# Patient Record
Sex: Female | Born: 1955 | Race: White | Hispanic: No
Health system: Midwestern US, Community
[De-identification: ages and names within clinical notes are randomized; demographics above are authoritative.]

## PROBLEM LIST (undated history)

## (undated) ENCOUNTER — Emergency Department (HOSPITAL_COMMUNITY)
Admission: EM | Disposition: A | Discharge status: Home / Self Care | Payer: Medicare Other | Attending: Chiropractic Medicine | Admitting: Chiropractic Medicine

## (undated) DIAGNOSIS — R222 Localized swelling, mass and lump, trunk: Secondary | ICD-10-CM

## (undated) DIAGNOSIS — Z87891 Personal history of nicotine dependence: Secondary | ICD-10-CM

## (undated) DIAGNOSIS — Z1231 Encounter for screening mammogram for malignant neoplasm of breast: Secondary | ICD-10-CM

## (undated) DIAGNOSIS — M79651 Pain in right thigh: Secondary | ICD-10-CM

## (undated) DIAGNOSIS — M79604 Pain in right leg: Secondary | ICD-10-CM

## (undated) DIAGNOSIS — M5410 Radiculopathy, site unspecified: Secondary | ICD-10-CM

## (undated) DIAGNOSIS — C3481 Malignant neoplasm of overlapping sites of right bronchus and lung: Secondary | ICD-10-CM

## (undated) DIAGNOSIS — J449 Chronic obstructive pulmonary disease, unspecified: Secondary | ICD-10-CM

## (undated) DIAGNOSIS — R221 Localized swelling, mass and lump, neck: Secondary | ICD-10-CM

## (undated) DIAGNOSIS — E2839 Other primary ovarian failure: Secondary | ICD-10-CM

## (undated) DIAGNOSIS — J219 Acute bronchiolitis, unspecified: Secondary | ICD-10-CM

## (undated) DIAGNOSIS — Z860100 Personal history of colon polyps, unspecified: Secondary | ICD-10-CM

## (undated) DIAGNOSIS — F329 Major depressive disorder, single episode, unspecified: Secondary | ICD-10-CM

## (undated) DIAGNOSIS — R4689 Other symptoms and signs involving appearance and behavior: Secondary | ICD-10-CM

## (undated) DIAGNOSIS — E785 Hyperlipidemia, unspecified: Secondary | ICD-10-CM

## (undated) DIAGNOSIS — I10 Essential (primary) hypertension: Secondary | ICD-10-CM

## (undated) DIAGNOSIS — K219 Gastro-esophageal reflux disease without esophagitis: Secondary | ICD-10-CM

## (undated) DIAGNOSIS — F32A Depression, unspecified: Secondary | ICD-10-CM

## (undated) DIAGNOSIS — R55 Syncope and collapse: Secondary | ICD-10-CM

## (undated) DIAGNOSIS — K589 Irritable bowel syndrome without diarrhea: Secondary | ICD-10-CM

## (undated) DIAGNOSIS — M797 Fibromyalgia: Secondary | ICD-10-CM

## (undated) DIAGNOSIS — M1990 Unspecified osteoarthritis, unspecified site: Secondary | ICD-10-CM

## (undated) DIAGNOSIS — I739 Peripheral vascular disease, unspecified: Secondary | ICD-10-CM

## (undated) DIAGNOSIS — R4589 Other symptoms and signs involving emotional state: Secondary | ICD-10-CM

## (undated) DIAGNOSIS — I779 Disorder of arteries and arterioles, unspecified: Secondary | ICD-10-CM

## (undated) DIAGNOSIS — F419 Anxiety disorder, unspecified: Secondary | ICD-10-CM

## (undated) DIAGNOSIS — M545 Low back pain, unspecified: Secondary | ICD-10-CM

## (undated) DIAGNOSIS — N63 Unspecified lump in unspecified breast: Secondary | ICD-10-CM

## (undated) DIAGNOSIS — D4410 Neoplasm of uncertain behavior of unspecified adrenal gland: Secondary | ICD-10-CM

## (undated) DIAGNOSIS — R109 Unspecified abdominal pain: Secondary | ICD-10-CM

## (undated) DIAGNOSIS — I7090 Unspecified atherosclerosis: Secondary | ICD-10-CM

## (undated) DIAGNOSIS — R413 Other amnesia: Secondary | ICD-10-CM

## (undated) DIAGNOSIS — D3500 Benign neoplasm of unspecified adrenal gland: Secondary | ICD-10-CM

## (undated) DIAGNOSIS — R928 Other abnormal and inconclusive findings on diagnostic imaging of breast: Secondary | ICD-10-CM

## (undated) DIAGNOSIS — D241 Benign neoplasm of right breast: Secondary | ICD-10-CM

## (undated) DIAGNOSIS — D4411 Neoplasm of uncertain behavior of right adrenal gland: Secondary | ICD-10-CM

## (undated) DIAGNOSIS — M79671 Pain in right foot: Secondary | ICD-10-CM

## (undated) DIAGNOSIS — R0609 Other forms of dyspnea: Secondary | ICD-10-CM

## (undated) DIAGNOSIS — K529 Noninfective gastroenteritis and colitis, unspecified: Secondary | ICD-10-CM

## (undated) HISTORY — DX: Irritable bowel syndrome, unspecified: K58.9

## (undated) HISTORY — DX: Personal history of colon polyps, unspecified: Z86.0100

## (undated) HISTORY — DX: Depression, unspecified: F32.A

## (undated) HISTORY — DX: Anxiety disorder, unspecified: F41.9

## (undated) HISTORY — DX: Unspecified osteoarthritis, unspecified site: M19.90

## (undated) HISTORY — PX: TUBAL LIGATION: SHX77

## (undated) HISTORY — PX: CHOLECYSTECTOMY: SHX55

## (undated) HISTORY — DX: Chronic obstructive pulmonary disease, unspecified: J44.9

## (undated) HISTORY — DX: Personal history of colonic polyps: Z86.010

## (undated) HISTORY — DX: Other symptoms and signs involving appearance and behavior: R46.89

## (undated) HISTORY — PX: BREAST SURGERY: SHX581

## (undated) HISTORY — DX: Disorder of arteries and arterioles, unspecified: I77.9

## (undated) HISTORY — DX: Fibromyalgia: M79.7

## (undated) HISTORY — DX: Syncope and collapse: R55

## (undated) HISTORY — DX: Hyperlipidemia, unspecified: E78.5

## (undated) HISTORY — DX: Essential (primary) hypertension: I10

## (undated) HISTORY — DX: Peripheral vascular disease, unspecified: I73.9

## (undated) HISTORY — DX: Major depressive disorder, single episode, unspecified: F32.9

## (undated) HISTORY — DX: Gastro-esophageal reflux disease without esophagitis: K21.9

## (undated) HISTORY — DX: Other symptoms and signs involving emotional state: R45.89

---

## 1976-03-18 HISTORY — PX: TUBAL LIGATION: SHX77

## 2005-03-18 HISTORY — PX: CARPAL TUNNEL RELEASE: SHX101

## 2005-03-19 ENCOUNTER — Encounter: Payer: Self-pay | Admitting: Internal Medicine

## 2005-03-21 ENCOUNTER — Encounter: Payer: Self-pay | Admitting: Internal Medicine

## 2005-03-29 ENCOUNTER — Encounter: Payer: Self-pay | Admitting: Internal Medicine

## 2005-04-22 NOTE — Procedures (Signed)
[facility]                           ELECTROENCEPHALOGRAM REPORT   NAME:     Ulbrich, Zarya M                              DATE:     04/22/2005   EEG#:                                                 MR#:      [id number]   ROOM:     OP                                         DOB:      09/02/1955   REFERRING PHYSICIAN:   cc:   HEMANG SHAH, M.D.         DR. BAROT   AGE:  49   Dear Dr. I. Barot:   Thank you for referring Ms. Coreas for EEG.   INDICATION:  Possible partial seizures.   MEDICATIONS:  Flax seed oil, fish oil,   INTRODUCTION:  EEG is performed on an 18-channel machine with 10/20   international system on this 49-year-old female to rule out seizure   disorder.  She was not given medication for EEG.   DESCRIPTION:  The background rhythm is 8-9 Hz, 30-40 microvolt activity,   which is bilaterally symmetrical and reactive to eye opening and closing.   Hyperventilation was performed which produced bilateral symmetrical   slowing.  Photic stimulation produced bilateral symmetrical occipital   driving.  Stage I sleep was recorded with 4-6 Hz slowing.   IMPRESSION:  This a normal awake and drowsy EEG.  No epileptiform   discharges seen.   CLINICAL CORRELATION:  Suggestive of normal cerebral function.   ____________________________________    ___________________________   HEMANG SHAH, M.D.                        Date   md  D: 04/22/2005  T: 04/23/2005 12:56 P   [id number]

## 2005-04-22 NOTE — Procedures (Signed)
[facility]                           ELECTROENCEPHALOGRAM REPORT   NAME:     Hannah [name], Hannah [name]                              DATE:     04/22/2005   EEG#:                                                 MR#:      [id number]   ROOM:     OP                                         DOB:      [birth year]   REFERRING PHYSICIAN:   cc:   Cristopher Peru, M.D.         DR. Tawanna Solo   AGE:  49   Dear Dr. Judge Stall:   Thank you for referring Ms. Mccullar for EEG.   INDICATION:  Possible partial seizures.   MEDICATIONS:  Flax seed oil, fish oil,   INTRODUCTION:  EEG is performed on an 18-channel machine with 10/20   international system on this 50 year old female to rule out seizure   disorder.  She was not given medication for EEG.   DESCRIPTION:  The background rhythm is 8-9 Hz, 30-40 microvolt activity,   which is bilaterally symmetrical and reactive to eye opening and closing.   Hyperventilation was performed which produced bilateral symmetrical   slowing.  Photic stimulation produced bilateral symmetrical occipital   driving.  Stage I sleep was recorded with 4-6 Hz slowing.   IMPRESSION:  This a normal awake and drowsy EEG.  No epileptiform   discharges seen.   CLINICAL CORRELATION:  Suggestive of normal cerebral function.   ____________________________________    ___________________________   Cristopher Peru, M.D.                        Date   md  D: 04/22/2005  T: 04/23/2005 12:56 P   [id number]

## 2005-04-29 ENCOUNTER — Encounter: Payer: Self-pay | Admitting: Internal Medicine

## 2005-08-14 NOTE — Op Note (Signed)
[facility]                                OPERATION REPORT                          SURGEON:  Jolyn Nap, MD   NAM JAPJI, KOK:   MR  [id number]                         DATE:            08/14/2005   #:   Lindley Magnus  [id number]                      PT. LOCATION:   #   Jolyn Nap, MD                   DOB: 30-Nov-1955   cc:    Jolyn Nap, MD   PREOPERATIVE DIAGNOSIS:   Right carpal tunnel syndrome.   POSTOPERATIVE DIAGNOSIS:   Right carpal tunnel syndrome.   OPERATION:   Endoscopic right carpal tunnel release using AGEE endoscopic carpal tunnel   release system.   SURGEON:   Dr. Milana Obey   SURGICAL ASSISTANT:   Argentina Donovan, CST   ANESTHESIA:   Monitored anesthesia care.   FLUIDS DELIVERED INTRAOPERATIVELY:   450 cc Ringers lactate   ESTIMATED BLOOD LOSS:   Minimal.   TOTAL TOURNIQUET TIME FOR THE CASE:   8 minutes at 250 mmHg.   COMPLICATIONS:   None.   INDICATIONS:   The patient is a 50 year old, right-hand dominant, white female who has   symptomatic right carpal tunnel syndrome that has failed conservative   treatment.  She is being taken to the operating room to undergo right   endoscopic carpal tunnel release.   PROCEDURE:  The patient was taken to the operating room and placed on the   OR table in supine position.  IV sedation was delivered followed by   placement of local infiltration anesthetic in the area of intended incision   in the right palm, wrist, and distal volar forearm.  A total volume of 10   cc of a 50/50 solution of 0.5% Marcaine and 2% Xylocaine plain was   injected.  The tourniquet was applied to the right upper extremity.  The   right upper extremity was then prepped and draped in the usual fashion for   surgery.  A 3.5 power loop magnification was used throughout the case.  The   right upper extremity was exsanguinated with an Esmarch bandage.  The   tourniquet was inflated to 250 mmHg.  The total tourniquet time for the    case was 8 minutes.  A transverse incision was made in the wrist flexion   crease from the flexor carpi radialis to the flexor carpi ulnaris.  After   the skin was incised, soft tissues were dissected bluntly with tenotomy   scissors.  The palmaris longus tendon was identified and retracted   radially.  The volar forearm fascia was identified and a distally based   U-shaped flap was formed by use of a fresh #15 scalpel blade.  This fascial   flap was then reflected distally.  This exposed the medial nerve at the   entry of the carpal canal.  The AGEE endoscopic carpal tunnel bursal   spatula/resector was then placed into the carpal canal and the soft tissues   were dissected off the undersurface of the transverse carpal ligament.  The   2 dilators were then placed starting with the smaller and ending with the   larger instrument.  The AGEE endoscopic carpal tunnel release blade   assembly was then placed into the carpal canal.  The distal edge of the   transverse carpal ligament was visualized.  There were no other visible   structures other than the transverse fibers of the transverse carpal   ligament.  The blade was deployed and the transverse carpal ligament was   released in the prescribed fashion maintaining alignment with the ring   finger and holding the blade against the hook of the hamate.  One   additional pass was necessary in order to resect a few very distal fibers   that were left intact with the first pass. Attention was then directed   proximally.  Soft tissues were dissected superficial and deep to the volar   forearm fascia proximal to the incision.  A long handle tenotomy scissor   was then used to incise the volar forearm fascia proximal to the incision   for a distance of about 4 to 5 cm in order to fully release all tight   structures in the volar forearm.  The endoscopic carpal tunnel blade   assembly was re-placed into the carpal canal and the tourniquet released.    There was no visible arterial bleeding.  There was some minimal venous   oozing.  At this point, all instrumentation was removed.  The wounds were   thoroughly irrigated with antibiotic irrigation.  Hemostasis was obtained   by direct pressure and elevation followed by bipolar electrocautery Bovie.   Subcutaneous closure was performed with 4-0 Monocryl sutures.  The skin was   closed with a subcuticular technique of 4-0 Monocryl sutures.  Benzoin and   Steri-Strips were applied followed by Xeroform and sterile gauze dressing.   A volar forearm plaster splint was then applied and held in place with an   Ace wrap.  The patient was then returned to the recovery area in good   condition.  There were no pre-, intra-, or postoperative complications.   Electronically Signed By:   Jolyn Nap, MD 08/18/2005 17:16   _________________________________   Jolyn Nap, MD   zga  D:  08/14/2005  T:  08/14/2005  9:41 A   [id number]

## 2005-08-14 NOTE — Op Note (Signed)
[facility]                                OPERATION REPORT                          SURGEON:  Hannah Nap, MD   NAM VITA, CURRIN:   MR  [id number]                         DATE:            08/14/2005   #:   Hannah [name]  [id number]                      PT. LOCATION:   #   Hannah Nap, MD                   DOB: Mar 31, 1955   cc:    Hannah Nap, MD   PREOPERATIVE DIAGNOSIS:   Right carpal tunnel syndrome.   POSTOPERATIVE DIAGNOSIS:   Right carpal tunnel syndrome.   OPERATION:   Endoscopic right carpal tunnel release using AGEE endoscopic carpal tunnel   release system.   SURGEON:   Dr. Milana Obey   SURGICAL ASSISTANT:   Argentina Donovan, CST   ANESTHESIA:   Monitored anesthesia care.   FLUIDS DELIVERED INTRAOPERATIVELY:   450 cc Ringers lactate   ESTIMATED BLOOD LOSS:   Minimal.   TOTAL TOURNIQUET TIME FOR THE CASE:   8 minutes at 250 mmHg.   COMPLICATIONS:   None.   INDICATIONS:   The patient is a 50 year old, right-hand dominant, white female who has   symptomatic right carpal tunnel syndrome that has failed conservative   treatment.  She is being taken to the operating room to undergo right   endoscopic carpal tunnel release.   PROCEDURE:  The patient was taken to the operating room and placed on the   OR table in supine position.  IV sedation was delivered followed by   placement of local infiltration anesthetic in the area of intended incision   in the right palm, wrist, and distal volar forearm.  A total volume of 10   cc of a 50/50 solution of 0.5% Marcaine and 2% Xylocaine plain was   injected.  The tourniquet was applied to the right upper extremity.  The   right upper extremity was then prepped and draped in the usual fashion for   surgery.  A 3.5 power loop magnification was used throughout the case.  The   right upper extremity was exsanguinated with an Esmarch bandage.  The   tourniquet was inflated to 250 mmHg.  The total tourniquet time for the   case was 8 minutes.  A transverse  incision was made in the wrist flexion   crease from the flexor carpi radialis to the flexor carpi ulnaris.  After   the skin was incised, soft tissues were dissected bluntly with tenotomy   scissors.  The palmaris longus tendon was identified and retracted   radially.  The volar forearm fascia was identified and a distally based   U-shaped flap was formed by use of a fresh #15 scalpel blade.  This fascial   flap was then reflected distally.  This exposed the medial nerve at the   entry of the carpal canal.  The AGEE endoscopic carpal tunnel bursal   spatula/resector was then placed into the carpal canal and the soft tissues   were dissected off the undersurface of the transverse carpal ligament.  The   2 dilators were then placed starting with the smaller and ending with the   larger instrument.  The AGEE endoscopic carpal tunnel release blade   assembly was then placed into the carpal canal.  The distal edge of the   transverse carpal ligament was visualized.  There were no other visible   structures other than the transverse fibers of the transverse carpal   ligament.  The blade was deployed and the transverse carpal ligament was   released in the prescribed fashion maintaining alignment with the ring   finger and holding the blade against the hook of the hamate.  One   additional pass was necessary in order to resect a few very distal fibers   that were left intact with the first pass. Attention was then directed   proximally.  Soft tissues were dissected superficial and deep to the volar   forearm fascia proximal to the incision.  A long handle tenotomy scissor   was then used to incise the volar forearm fascia proximal to the incision   for a distance of about 4 to 5 cm in order to fully release all tight   structures in the volar forearm.  The endoscopic carpal tunnel blade   assembly was re-placed into the carpal canal and the tourniquet released.   There was no visible arterial bleeding.  There was some  minimal venous   oozing.  At this point, all instrumentation was removed.  The wounds were   thoroughly irrigated with antibiotic irrigation.  Hemostasis was obtained   by direct pressure and elevation followed by bipolar electrocautery Bovie.   Subcutaneous closure was performed with 4-0 Monocryl sutures.  The skin was   closed with a subcuticular technique of 4-0 Monocryl sutures.  Benzoin and   Steri-Strips were applied followed by Xeroform and sterile gauze dressing.   A volar forearm plaster splint was then applied and held in place with an   Ace wrap.  The patient was then returned to the recovery area in good   condition.  There were no pre-, intra-, or postoperative complications.   Electronically Signed By:   Hannah Nap, MD 08/18/2005 17:16   _________________________________   Hannah Nap, MD   zga  D:  08/14/2005  T:  08/14/2005  9:41 A   [id number]

## 2006-02-03 NOTE — Procedures (Signed)
[facility]                              CARDIOLOGY DEPARTMENT                     AMBULATORY ECG (HOLTER MONITOR) REPORT   Name: [name], Hannah M                      Location:            Age: 50   Date:   Ref Phys:                                  MR#: [id number]   Billing#      [id number]   Reason For Study:   Procedure:    A dual channel ambulatory ECG was obtained and computer scan                 analysis was performed.  Selected strips were reviewed as                 recorded in real time.   Results and Impression:   INDICATION:  785.1   FINDINGS:  Technically adequate triple-channel ambulatory ECG.  Today's   tracing demonstrates underlying sinus mechanism with normal heart rate   ranging from 54 up to 130 beats per minute - average rate is 86 beats per   minute.  There were 3 VPCs - no couplets or triplets.  There were 34 APCs   with a single couplet and a single 5-beat run of PSVT at 160 beats per   minute.   No pauses or episodes of heart block were seen.   The patient noted 2 episodes of "palpitations."  Both were seen as sinus   rhythm and no ectopy.   OVERALL IMPRESSION:  Sinus rhythm with rare, asymptomatic APCs and VPCs   including a single 5-beat run of PSVT.  The patient's 2 episodes of   "palpitations" were not associated with any dysrhythmia.   __________________________________________________   CHARLES ASHBY, JR, M.D.   sc  D: 02/03/2006  T: 02/03/2006  4:00 P    [id number]

## 2006-02-03 NOTE — Procedures (Signed)
 [facility]                              CARDIOLOGY DEPARTMENT                     AMBULATORY ECG (HOLTER MONITOR) REPORT   Name: Hannah [name], Hannah [name]                      Location:            Age: 50   Date:   Ref Phys:                                  MR#: [id number]   Billing#      [id number]   Reason For Study:   Procedure:    A dual channel ambulatory ECG was obtained and computer scan                 analysis was performed.  Selected strips were reviewed as                 recorded in real time.   Results and Impression:   INDICATION:  785.1   FINDINGS:  Technically adequate triple-channel ambulatory ECG.  Today's   tracing demonstrates underlying sinus mechanism with normal heart rate   ranging from 54 up to 130 beats per minute - average rate is 86 beats per   minute.  There were 3 VPCs - no couplets or triplets.  There were 34 APCs   with a single couplet and a single 5-beat run of PSVT at 160 beats per   minute.   No pauses or episodes of heart block were seen.   The patient noted 2 episodes of palpitations.  Both were seen as sinus   rhythm and no ectopy.   OVERALL IMPRESSION:  Sinus rhythm with rare, asymptomatic APCs and VPCs   including a single 5-beat run of PSVT.  The patient's 2 episodes of   palpitations were not associated with any dysrhythmia.   __________________________________________________   CARLIN ALMYRA RADDLE, M.D.   sc  D: 02/03/2006  T: 02/03/2006  4:00 P    [id number]

## 2006-03-18 HISTORY — PX: COLONOSCOPY: SHX174

## 2006-11-18 DIAGNOSIS — M797 Fibromyalgia: Secondary | ICD-10-CM | POA: Insufficient documentation

## 2006-11-18 DIAGNOSIS — G2581 Restless legs syndrome: Secondary | ICD-10-CM | POA: Insufficient documentation

## 2007-02-10 ENCOUNTER — Encounter: Payer: Self-pay | Admitting: Family Medicine

## 2007-10-15 ENCOUNTER — Ambulatory Visit: Payer: Self-pay | Admitting: Internal Medicine

## 2007-10-30 ENCOUNTER — Other Ambulatory Visit: Payer: Self-pay

## 2007-10-30 ENCOUNTER — Ambulatory Visit: Payer: Self-pay | Admitting: Surgery

## 2007-11-04 ENCOUNTER — Ambulatory Visit: Payer: Self-pay | Admitting: Surgery

## 2007-11-25 ENCOUNTER — Ambulatory Visit: Payer: Self-pay | Admitting: Family Medicine

## 2007-11-25 DIAGNOSIS — IMO0001 Reserved for inherently not codable concepts without codable children: Secondary | ICD-10-CM

## 2007-12-31 ENCOUNTER — Ambulatory Visit: Payer: Self-pay | Admitting: Internal Medicine

## 2008-07-18 ENCOUNTER — Ambulatory Visit: Payer: Self-pay | Admitting: Family Medicine

## 2008-07-18 ENCOUNTER — Encounter: Payer: Self-pay | Admitting: Family Medicine

## 2008-07-18 ENCOUNTER — Other Ambulatory Visit: Admission: RE | Admit: 2008-07-18 | Discharge: 2008-07-18 | Payer: Self-pay | Admitting: Family Medicine

## 2008-07-22 ENCOUNTER — Encounter (INDEPENDENT_AMBULATORY_CARE_PROVIDER_SITE_OTHER): Payer: Self-pay

## 2008-07-25 ENCOUNTER — Encounter: Admission: RE | Admit: 2008-07-25 | Discharge: 2008-07-25 | Payer: Self-pay | Admitting: Family Medicine

## 2008-08-01 ENCOUNTER — Encounter (INDEPENDENT_AMBULATORY_CARE_PROVIDER_SITE_OTHER): Payer: Self-pay | Admitting: *Deleted

## 2008-08-09 ENCOUNTER — Telehealth: Payer: Self-pay | Admitting: Family Medicine

## 2008-08-09 ENCOUNTER — Ambulatory Visit: Payer: Self-pay | Admitting: Family Medicine

## 2008-08-09 ENCOUNTER — Emergency Department (HOSPITAL_COMMUNITY): Admission: EM | Admit: 2008-08-09 | Discharge: 2008-08-09 | Payer: Self-pay | Admitting: Emergency Medicine

## 2008-08-09 DIAGNOSIS — F3341 Major depressive disorder, recurrent, in partial remission: Secondary | ICD-10-CM | POA: Insufficient documentation

## 2008-08-18 ENCOUNTER — Ambulatory Visit: Payer: Self-pay | Admitting: Family Medicine

## 2008-08-18 LAB — CONVERTED CEMR LAB
AST: 24 units/L (ref 0–37)
Albumin: 3.8 g/dL (ref 3.5–5.2)
Alkaline Phosphatase: 89 units/L (ref 39–117)
BUN: 17 mg/dL (ref 6–23)
Basophils Absolute: 0.1 10*3/uL (ref 0.0–0.1)
Basophils Relative: 0.8 % (ref 0.0–3.0)
Chloride: 105 meq/L (ref 96–112)
Creatinine, Ser: 0.9 mg/dL (ref 0.4–1.2)
Direct LDL: 199.4 mg/dL
Eosinophils Absolute: 0.2 10*3/uL (ref 0.0–0.7)
GFR calc non Af Amer: 69.95 mL/min (ref >60)
Glucose, Bld: 98 mg/dL (ref 70–99)
HCT: 38.6 % (ref 36.0–46.0)
Lymphocytes Relative: 39.8 % (ref 12.0–46.0)
MCHC: 35.2 g/dL (ref 30.0–36.0)
Monocytes Absolute: 0.7 10*3/uL (ref 0.1–1.0)
Platelets: 236 10*3/uL (ref 150.0–400.0)
Potassium: 4.7 meq/L (ref 3.5–5.1)
RBC: 4.08 M/uL (ref 3.87–5.11)
RDW: 12.2 % (ref 11.5–14.6)
TSH: 1.58 microintl units/mL (ref 0.35–5.50)
Total Bilirubin: 0.6 mg/dL (ref 0.3–1.2)
Total CHOL/HDL Ratio: 6
Triglycerides: 216 mg/dL — ABNORMAL HIGH (ref 0.0–149.0)
VLDL: 43.2 mg/dL — ABNORMAL HIGH (ref 0.0–40.0)
WBC: 7.8 10*3/uL (ref 4.5–10.5)

## 2008-08-19 ENCOUNTER — Ambulatory Visit: Payer: Self-pay | Admitting: Family Medicine

## 2008-08-19 DIAGNOSIS — E785 Hyperlipidemia, unspecified: Secondary | ICD-10-CM | POA: Insufficient documentation

## 2008-09-26 ENCOUNTER — Ambulatory Visit: Payer: Self-pay | Admitting: Family Medicine

## 2008-09-26 DIAGNOSIS — G479 Sleep disorder, unspecified: Secondary | ICD-10-CM | POA: Insufficient documentation

## 2008-10-28 ENCOUNTER — Ambulatory Visit: Payer: Self-pay | Admitting: Family Medicine

## 2008-11-02 ENCOUNTER — Encounter: Admission: RE | Admit: 2008-11-02 | Discharge: 2008-11-02 | Payer: Self-pay | Admitting: Family Medicine

## 2008-11-07 ENCOUNTER — Telehealth: Payer: Self-pay | Admitting: Family Medicine

## 2008-11-10 ENCOUNTER — Encounter: Payer: Self-pay | Admitting: Family Medicine

## 2008-12-01 ENCOUNTER — Encounter: Payer: Self-pay | Admitting: Family Medicine

## 2008-12-19 ENCOUNTER — Encounter: Payer: Self-pay | Admitting: Family Medicine

## 2009-01-02 ENCOUNTER — Ambulatory Visit: Payer: Self-pay | Admitting: Rheumatology

## 2009-01-18 ENCOUNTER — Encounter: Payer: Self-pay | Admitting: Family Medicine

## 2009-01-24 ENCOUNTER — Ambulatory Visit: Payer: Self-pay | Admitting: Rheumatology

## 2009-01-27 ENCOUNTER — Telehealth: Payer: Self-pay | Admitting: Family Medicine

## 2009-01-27 ENCOUNTER — Ambulatory Visit: Payer: Self-pay | Admitting: Family Medicine

## 2009-02-08 ENCOUNTER — Ambulatory Visit: Payer: Self-pay | Admitting: Family Medicine

## 2009-02-14 ENCOUNTER — Encounter: Payer: Self-pay | Admitting: Family Medicine

## 2009-02-14 ENCOUNTER — Ambulatory Visit: Payer: Self-pay

## 2009-03-01 ENCOUNTER — Ambulatory Visit: Payer: Self-pay | Admitting: Family Medicine

## 2009-03-27 ENCOUNTER — Ambulatory Visit: Payer: Self-pay | Admitting: Family Medicine

## 2009-03-27 LAB — CONVERTED CEMR LAB
AST: 31 units/L (ref 0–37)
Direct LDL: 164.3 mg/dL
Total Bilirubin: 0.6 mg/dL (ref 0.3–1.2)

## 2009-03-30 ENCOUNTER — Ambulatory Visit: Payer: Self-pay | Admitting: Family Medicine

## 2009-04-04 ENCOUNTER — Telehealth: Payer: Self-pay | Admitting: Family Medicine

## 2009-04-05 ENCOUNTER — Telehealth: Payer: Self-pay | Admitting: Family Medicine

## 2009-04-06 ENCOUNTER — Ambulatory Visit: Payer: Self-pay | Admitting: Family Medicine

## 2009-04-06 ENCOUNTER — Telehealth (INDEPENDENT_AMBULATORY_CARE_PROVIDER_SITE_OTHER): Payer: Self-pay | Admitting: *Deleted

## 2009-04-07 ENCOUNTER — Ambulatory Visit: Payer: Self-pay | Admitting: Cardiovascular Disease

## 2009-04-07 ENCOUNTER — Encounter: Payer: Self-pay | Admitting: Internal Medicine

## 2009-04-07 DIAGNOSIS — F17200 Nicotine dependence, unspecified, uncomplicated: Secondary | ICD-10-CM | POA: Insufficient documentation

## 2009-04-10 ENCOUNTER — Ambulatory Visit: Payer: Self-pay | Admitting: Internal Medicine

## 2009-04-10 ENCOUNTER — Ambulatory Visit: Payer: Self-pay

## 2009-04-10 ENCOUNTER — Encounter (HOSPITAL_COMMUNITY): Admission: RE | Admit: 2009-04-10 | Discharge: 2009-06-23 | Payer: Self-pay | Admitting: Family Medicine

## 2009-04-11 ENCOUNTER — Telehealth: Payer: Self-pay | Admitting: Internal Medicine

## 2009-04-12 ENCOUNTER — Encounter: Payer: Self-pay | Admitting: Family Medicine

## 2009-04-12 ENCOUNTER — Telehealth: Payer: Self-pay | Admitting: Family Medicine

## 2009-04-12 ENCOUNTER — Telehealth: Payer: Self-pay | Admitting: Cardiovascular Disease

## 2009-04-14 ENCOUNTER — Telehealth: Payer: Self-pay | Admitting: Family Medicine

## 2009-04-14 ENCOUNTER — Telehealth: Payer: Self-pay | Admitting: Cardiovascular Disease

## 2009-04-17 ENCOUNTER — Encounter: Payer: Self-pay | Admitting: Family Medicine

## 2009-04-18 ENCOUNTER — Telehealth (INDEPENDENT_AMBULATORY_CARE_PROVIDER_SITE_OTHER): Payer: Self-pay | Admitting: *Deleted

## 2009-04-24 ENCOUNTER — Ambulatory Visit: Payer: Self-pay | Admitting: Family Medicine

## 2009-04-28 ENCOUNTER — Encounter: Payer: Self-pay | Admitting: Family Medicine

## 2009-06-26 ENCOUNTER — Ambulatory Visit: Payer: Self-pay | Admitting: Family Medicine

## 2009-06-26 DIAGNOSIS — K589 Irritable bowel syndrome without diarrhea: Secondary | ICD-10-CM | POA: Insufficient documentation

## 2009-06-26 LAB — CONVERTED CEMR LAB
Albumin: 4.3 g/dL (ref 3.5–5.2)
BUN: 14 mg/dL (ref 6–23)
Basophils Absolute: 0 10*3/uL (ref 0.0–0.1)
Bilirubin, Direct: 0 mg/dL (ref 0.0–0.3)
CO2: 29 meq/L (ref 19–32)
Calcium: 9.4 mg/dL (ref 8.4–10.5)
Cholesterol: 158 mg/dL (ref 0–200)
Direct LDL: 85.3 mg/dL
Glucose, Bld: 100 mg/dL — ABNORMAL HIGH (ref 70–99)
HCT: 40.8 % (ref 36.0–46.0)
HDL: 44.4 mg/dL (ref >39.00)
Hemoglobin: 14.3 g/dL (ref 12.0–15.0)
Lymphocytes Relative: 39 % (ref 12.0–46.0)
MCHC: 35 g/dL (ref 30.0–36.0)
MCV: 94.9 fL (ref 78.0–100.0)
Monocytes Relative: 7.2 % (ref 3.0–12.0)
Neutro Abs: 3.9 10*3/uL (ref 1.4–7.7)
Neutrophils Relative %: 51.7 % (ref 43.0–77.0)
RBC: 4.3 M/uL (ref 3.87–5.11)
Rhuematoid fact SerPl-aCnc: 20.7 intl units/mL — ABNORMAL HIGH (ref 0.0–20.0)
Total Bilirubin: 0.3 mg/dL (ref 0.3–1.2)
Total CK: 145 units/L (ref 7–177)
Triglycerides: 210 mg/dL — ABNORMAL HIGH (ref 0.0–149.0)

## 2009-07-03 LAB — CONVERTED CEMR LAB
Albumin ELP: 61.1 % (ref 55.8–66.1)
Angiotensin 1 Converting Enzyme: 77 units/L — ABNORMAL HIGH (ref 9–67)
Beta Globulin: 6.3 % (ref 4.7–7.2)
IgA: 92 mg/dL (ref 68–378)
IgG (Immunoglobin G), Serum: 686 mg/dL — ABNORMAL LOW (ref 694–1618)

## 2009-07-18 ENCOUNTER — Encounter: Payer: Self-pay | Admitting: Family Medicine

## 2009-08-02 ENCOUNTER — Ambulatory Visit: Payer: Self-pay | Admitting: Family Medicine

## 2009-08-02 LAB — CONVERTED CEMR LAB
Glucose, Urine, Semiquant: NEGATIVE
Protein, U semiquant: NEGATIVE
Specific Gravity, Urine: 1.015
pH: 5

## 2009-08-16 ENCOUNTER — Ambulatory Visit: Payer: Self-pay | Admitting: Family Medicine

## 2009-08-16 HISTORY — PX: UPPER GASTROINTESTINAL ENDOSCOPY: SHX188

## 2009-08-16 LAB — CONVERTED CEMR LAB
Specific Gravity, Urine: 1.02
Urobilinogen, UA: 0.2
pH: 5

## 2009-08-17 ENCOUNTER — Encounter: Payer: Self-pay | Admitting: Family Medicine

## 2009-08-17 LAB — CONVERTED CEMR LAB
H Pylori IgG: NEGATIVE
TSH: 2.06 microintl units/mL (ref 0.35–5.50)
Vitamin B-12: 392 pg/mL (ref 211–911)

## 2009-08-24 ENCOUNTER — Telehealth: Payer: Self-pay | Admitting: Family Medicine

## 2009-08-24 ENCOUNTER — Telehealth: Payer: Self-pay | Admitting: Internal Medicine

## 2009-08-30 ENCOUNTER — Ambulatory Visit: Payer: Self-pay | Admitting: Internal Medicine

## 2009-08-30 ENCOUNTER — Ambulatory Visit: Payer: Self-pay | Admitting: Family Medicine

## 2009-08-30 DIAGNOSIS — Z860100 Personal history of colon polyps, unspecified: Secondary | ICD-10-CM | POA: Insufficient documentation

## 2009-08-30 DIAGNOSIS — IMO0002 Reserved for concepts with insufficient information to code with codable children: Secondary | ICD-10-CM | POA: Insufficient documentation

## 2009-09-01 ENCOUNTER — Ambulatory Visit: Payer: Self-pay | Admitting: Internal Medicine

## 2009-09-04 ENCOUNTER — Telehealth: Payer: Self-pay | Admitting: Family Medicine

## 2009-09-07 ENCOUNTER — Encounter: Payer: Self-pay | Admitting: Internal Medicine

## 2009-09-19 ENCOUNTER — Ambulatory Visit: Payer: Self-pay | Admitting: Family Medicine

## 2009-09-25 ENCOUNTER — Ambulatory Visit: Payer: Self-pay | Admitting: Family Medicine

## 2009-09-25 LAB — CONVERTED CEMR LAB
Bilirubin Urine: NEGATIVE
Blood in Urine, dipstick: NEGATIVE
Glucose, Urine, Semiquant: NEGATIVE
Ketones, urine, test strip: NEGATIVE
WBC Urine, dipstick: NEGATIVE

## 2009-09-26 ENCOUNTER — Encounter: Payer: Self-pay | Admitting: Family Medicine

## 2009-10-02 ENCOUNTER — Telehealth: Payer: Self-pay | Admitting: Family Medicine

## 2009-10-02 ENCOUNTER — Encounter: Payer: Self-pay | Admitting: Family Medicine

## 2009-10-12 ENCOUNTER — Encounter: Admission: RE | Admit: 2009-10-12 | Discharge: 2009-10-12 | Payer: Self-pay | Admitting: Family Medicine

## 2009-10-12 ENCOUNTER — Encounter: Admission: RE | Admit: 2009-10-12 | Discharge: 2009-10-12 | Payer: Self-pay | Admitting: Urology

## 2009-10-13 ENCOUNTER — Telehealth: Payer: Self-pay | Admitting: Family Medicine

## 2009-10-16 ENCOUNTER — Telehealth: Payer: Self-pay | Admitting: Family Medicine

## 2009-10-16 ENCOUNTER — Encounter: Payer: Self-pay | Admitting: Family Medicine

## 2009-10-17 ENCOUNTER — Telehealth (INDEPENDENT_AMBULATORY_CARE_PROVIDER_SITE_OTHER): Payer: Self-pay | Admitting: *Deleted

## 2009-11-06 ENCOUNTER — Ambulatory Visit: Payer: Self-pay | Admitting: Family Medicine

## 2009-11-15 ENCOUNTER — Telehealth: Payer: Self-pay | Admitting: Family Medicine

## 2009-11-21 ENCOUNTER — Encounter: Payer: Self-pay | Admitting: Family Medicine

## 2009-11-22 ENCOUNTER — Encounter: Payer: Self-pay | Admitting: Family Medicine

## 2009-11-24 ENCOUNTER — Telehealth: Payer: Self-pay | Admitting: Family Medicine

## 2009-11-27 ENCOUNTER — Ambulatory Visit: Payer: Self-pay | Admitting: Psychology

## 2009-12-13 ENCOUNTER — Encounter: Payer: Self-pay | Admitting: Family Medicine

## 2010-01-30 ENCOUNTER — Ambulatory Visit: Payer: Self-pay | Admitting: Family Medicine

## 2010-01-31 LAB — CONVERTED CEMR LAB
BUN: 13 mg/dL (ref 6–23)
Chloride: 101 meq/L (ref 96–112)
Creatinine, Ser: 0.8 mg/dL (ref 0.4–1.2)
Glucose, Bld: 93 mg/dL (ref 70–99)
TSH: 1.36 microintl units/mL (ref 0.35–5.50)

## 2010-02-14 ENCOUNTER — Encounter: Payer: Self-pay | Admitting: Family Medicine

## 2010-04-08 ENCOUNTER — Encounter: Payer: Self-pay | Admitting: Family Medicine

## 2010-04-17 NOTE — Assessment & Plan Note (Signed)
Summary: F/U/CLE   Vital Signs:  Patient profile:   55 year old female Weight:      197.13 pounds BMI:     33.96 Temp:     98.1 degrees F oral Pulse rate:   80 / minute Pulse rhythm:   regular BP sitting:   120 / 80  (left arm) Cuff size:   large  Vitals Entered By: Linde Gillis CMA Duncan Dull) (April 24, 2009 12:14 PM) CC: follow-up visit   History of Present Illness: 55 year old female:  f/u stress test, holter monitor.  Now pulse returned to normal rate.  Fibromyalgia:  Dep:  Pool therapy.  Hurting now to raise her arms and hurting.   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 * Lexiscan 2)  * Savella   Impression & Recommendations:  Problem # 1:  FIBROMYALGIA (ICD-729.1) >15 minutes spent in face to face time with patient, >50% spent in counselling or coordination of care: discussed, doing poorly, still with pain all over, poorly controlled. Has been on multiple meds, all labs reviewed again. Will start pool therapy, depression stable, reviewed rheum labs again. Discussed case with Marchelle Folks, [place], with Dr. Corliss Skains who has agreed to see this patient on Friday. I appreciate their assistance with this patient who has had minimal relief despite multiple attempts at treatment.  Orders: Physical Therapy Referral (PT)  Problem # 2:  PALPITATIONS (ICD-785.1) completely resolved decreased coreg and pulse fine  Her updated medication list for this problem includes:    Carvedilol 3.125 Mg Tabs (Carvedilol) .Marland Kitchen... 1 by mouth two times a day  Complete Medication List: 1)  Omeprazole 40 Mg  Cpdr (Omeprazole) .... Take one tablet daily 2)  Klonopin 0.5 Mg Tabs (Clonazepam) .Marland Kitchen.. 1 by mouth at night 3)  Gabapentin 800 Mg Tabs (Gabapentin) .Marland Kitchen.. 1 by mouth three times a day 4)  Voltaren 1 % Gel (Diclofenac sodium) .... Apply as directed 4 times daily as needed 5)  Carvedilol 3.125 Mg Tabs (Carvedilol) .Marland Kitchen.. 1 by mouth two times a day 6)  Xyzal 5 Mg Tabs (Levocetirizine dihydrochloride) .... One a day 7)  Spiriva Handihaler 18 Mcg Caps (Tiotropium bromide monohydrate) .Marland Kitchen.. 1 inh  daily 8)  Zoloft 25 Mg Tabs (Sertraline hcl) .Marland Kitchen.. 1 by mouth daily  Patient Instructions: 1)  WE CALL ABOUT RHEUMATOLOGY  Current Allergies (reviewed today): ! * LEXISCAN * SAVELLA

## 2010-04-17 NOTE — Progress Notes (Signed)
Summary: Dysphagia-new pt  Phone Note From Other Clinic   Caller: Shirlee Limerick [phone] @Dr  Copland Call For: Dr Juanda Chance (Doc of the Day) Reason for Call: Schedule Patient Appt Summary of Call: Dysphagia screen for Endo. would like appt as soon as possible, does not have to be tomorrow. Initial call taken by: Leanor Kail [facility],  August 24, 2009 3:16 PM  Follow-up for Phone Call        Pt. will see Dr.Demeisha Geraghty on 08-30-09 at 8:45am. Msg. left for [facility]. Shirlee Limerick will advise pt. of appt/med.list/co-pay/cx.policy. Follow-up by: Laureen Ochs LPN,  August 25, 4538 3:23 PM

## 2010-04-17 NOTE — Progress Notes (Signed)
Summary: ? CT  Phone Note Call from Patient Call back at Home Phone [phone]   Caller: Patient Call For: Hannah Beat MD Summary of Call: Patient says she saw Dr. Hetty Ely on 08/30/09 in your absence because her back was out.  She had her last CT in 2006 and brought in the results to Dr. Hetty Ely.  Her back is still out and she is asking if you would order another CT for evaluation?   Initial call taken by: Delilah Shan CMA Duncan Dull),  September 04, 2009 9:40 AM  Follow-up for Phone Call        discussed  treat conservatively.  oral steroids, f/u with me in 2-3 weeks Follow-up by: Hannah Beat MD,  September 04, 2009 5:56 PM    New/Updated Medications: PREDNISONE 10 MG TABS (PREDNISONE) 4 tabs by mouth for 6 days, then 3 tabs by mouth for 4 days, then 2 tabs by mouth for 4 days, then 1 tab by mouth for 4 days Prescriptions: PREDNISONE 10 MG TABS (PREDNISONE) 4 tabs by mouth for 6 days, then 3 tabs by mouth for 4 days, then 2 tabs by mouth for 4 days, then 1 tab by mouth for 4 days  #48 x 0   Entered and Authorized by:   Hannah Beat MD   Signed by:   Hannah Beat MD on 09/04/2009   Method used:   Electronically to        [facility]* (retail)       [address]       [place], [place]  [address]       Ph: [phone]       Fax: [phone]   RxID:   [id number]

## 2010-04-17 NOTE — Progress Notes (Signed)
Summary: Clarification on Voltaren Gel  Phone Note From Pharmacy   Caller: [facility] Call For: [phone]  Summary of Call: Received fax from pharmacy needing clarification on Voltaren Gel.  Form in your IN box.  Please advise Initial call taken by: Linde Gillis CMA Duncan Dull),  April 14, 2009 9:02 AM  Follow-up for Phone Call        Did one yesterday, will do again Follow-up by: Hannah Beat MD,  April 14, 2009 9:36 AM

## 2010-04-17 NOTE — Letter (Signed)
[facility]   Imported By: Sherian Rein 09/25/2009 11:27:32  _____________________________________________________________________  External Attachment:    Type:   Image     Comment:   External Document

## 2010-04-17 NOTE — Assessment & Plan Note (Signed)
Summary: LEG CRAMPS/CLE   Vital Signs:  Patient profile:   55 year old female Height:      64 inches Weight:      177.0 pounds BMI:     30.49 Temp:     98.7 degrees F oral Pulse rate:   76 / minute Pulse rhythm:   regular BP sitting:   120 / 74  (left arm) Cuff size:   regular  Vitals Entered By: Benny Lennert CMA Duncan Dull) (January 30, 2010 9:42 AM)  History of Present Illness: Chief complaint Muscle cramps   In last month cramps in B anterior legs, toes drawing up, hands drawing up some. Mainly occuring at night.  2 weeks ago..startted taking potassium x 1 week...helped a lot then stopped now issue back. Most recent med  low dose antibiotic from urologist x 1 month (trazodone..but it makes her nausea so rarely takes. She completed antibitoic 1 week ago. No restless legs, no creepy crawly feeling in legs.  Drinks a lot of water...6 glassesa day. Cramps improve some ewith walk and massage.  Problems Prior to Update: 1)  Memory Loss  (ICD-780.93) 2)  Mri, Brain, Abnormal  (ICD-794.09) 3)  Adverse Drug Reaction  (ICD-995.20) 4)  Back Pain, Lumbar, With Radiculopathy  (ICD-724.4) 5)  Colonic Polyps, Hx of  (ICD-V12.72) 6)  Other Dysphagia  (ICD-787.29) 7)  Uti  (ICD-599.0) 8)  Ibs  (ICD-564.1) 9)  Arthralgia  (ICD-719.40) 10)  Tobacco Abuse  (ICD-305.1) 11)  Peripheral Edema  (ICD-782.3) 12)  Sleep Disorder  (ICD-780.50) 13)  Hyperlipidemia  (ICD-272.4) 14)  Encounter For Long-term Use of Other Medications  (ICD-V58.69) 15)  Depression, Major, Recurrent, Severe  (ICD-296.33) 16)  Health Maintenance Exam  (ICD-V70.0) 17)  Other Screening Mammogram  (ICD-V76.12) 18)  Fibromyalgia  (ICD-729.1)  Allergies: 1)  ! * Lexiscan 2)  * Savella  Past History:  Past medical, surgical, family and social histories (including risk factors) reviewed, and no changes noted (except as noted below).  Past Medical History: Reviewed history from 11/06/2009 and no changes  required. OA - hips, back IBS HTN Fibromyalgia h/o Depression  h/o excessive ETOH use, cut back now, no rehab Suicidal gesture, > 20 years ago Hyperlipidemia Chronic small vessel ischemic changes, pons (small T2 change), Neuro felt not c/w MS Tobacco abuse  Rheum = Dr. [facility]  Past Surgical History: Reviewed history from 11/26/2009 and no changes required. CTS, 2007 R Gall Bladder, Dr. Clydie Braun Colonoscopy, 2008, polyps, repeat q5 years[place] (03/29/2005-hyperplastic polyp)  Family History: Reviewed history from 04/07/2009 and no changes required. Mother - 49, d/c ischemic bowel, HTN, back pain, uterine CA, borderline DM Father, alcoholic, CAD, MI Family History of Alcoholism/Addiction Family History of Colon CA 1st degree relative <60, Aunt - 22's  Sister, CAD, first MI at age 41, 3 cardiac procedures, Chrohn's Dz Sister - thyroid dysfuction  DM, multiple  Social History: Reviewed history from 04/07/2009 and no changes required. December moved from [facility] Married, [place] 2 children - 31, 34 (step -40) Current Smoker, 70 pack years (down to 1/2 ppd) Alcohol use-yes, 1-2 glasses of wine every two weeks Drug use-no Regular exercise-yes, 30 min, twice a week  Review of Systems General:  Denies fatigue and fever. CV:  Denies chest pain or discomfort. Resp:  Denies shortness of breath; severe varicose veins B legs. .  Physical Exam  General:  overweight appearing female in NAd Mouth:  Oral mucosa and oropharynx without lesions or exudates.  Teeth in good repair.  Neck:  no cervical or supraclavicular lymphadenopathy no carotid bruit or thyromegaly  Lungs:  Normal respiratory effort, chest expands symmetrically. Lungs are clear to auscultation, no crackles or wheezes. Heart:  Normal rate and regular rhythm. S1 and S2 normal without gallop, murmur, click, rub or other extra sounds. Pulses:  R and L posterior tibial pulses are full and equal  bilaterally  Extremities:  B varicosities, no calf pain to palpation, no rash Neurologic:  strength normal in lower extremities.     Impression & Recommendations:  Problem # 1:  LEG CRAMPS (ICD-729.82) ? due to recent antibitoc if sulfa drug? Not on med that lowers potassium.  Eval with labs.  in meantime treat with massage, stretching and yellow mustard. Orders: TLB-BMP (Basic Metabolic Panel-BMET) (80048-METABOL) TLB-TSH (Thyroid Stimulating Hormone) (84443-TSH)  Complete Medication List: 1)  Voltaren 1 % Gel (Diclofenac sodium) .... Apply as directed 4 times daily as needed (3 month supply) 2)  Carvedilol 3.125 Mg Tabs (Carvedilol) .Marland Kitchen.. 1 by mouth two times a day 3)  Spiriva Handihaler 18 Mcg Caps (Tiotropium bromide monohydrate) .Marland Kitchen.. 1 inh  daily 4)  Centrum Silver Ultra Womens Tabs (Multiple vitamins-minerals) .... Once daily 5)  Phillips Colon Health Caps (Probiotic product) .... Once daily 6)  Vitamin D3 2000iu  .... Once daily 7)  Prevacid 30 Mg Cpdr (Lansoprazole) .Marland Kitchen.. 1 capsule twice a day 30 minutes before meals 8)  Lipitor 40 Mg Tabs (Atorvastatin calcium) .... One tablet daily 9)  Trazodone Hcl 100 Mg Tabs (Trazodone hcl) .Marland Kitchen.. 1 by mouth at bedtime  Other Orders: Admin 1st Vaccine (16109) Flu Vaccine 8yrs + (60454)  Patient Instructions: 1)  We will call with labs results. 2)  Start with stretching three times a day 3)  Try yellow mustard for cramps at night..1 tablespoon at a time.    Orders Added: 1)  Admin 1st Vaccine [90471] 2)  Flu Vaccine 29yrs + [90658] 3)  Est. Patient Level III [09811] 4)  TLB-BMP (Basic Metabolic Panel-BMET) [80048-METABOL] 5)  TLB-TSH (Thyroid Stimulating Hormone) [91478-GNF]    Current Allergies (reviewed today): ! * LEXISCAN * SAVELLA      Flu Vaccine Consent Questions     Do you have a history of severe allergic reactions to this vaccine? no    Any prior history of allergic reactions to egg and/or gelatin? no    Do  you have a sensitivity to the preservative Thimersol? no    Do you have a past history of Guillan-Barre Syndrome? no    Do you currently have an acute febrile illness? no    Have you ever had a severe reaction to latex? no    Vaccine information given and explained to patient? yes    Are you currently pregnant? no    Lot Number:[id number]   Exp Date:09/15/2010   Site Given  Left Deltoid IM   .lbflu

## 2010-04-17 NOTE — Progress Notes (Signed)
  Phone Note Call from Patient   Caller: Patient Details for Reason: FYI Rheumatology consult Summary of Call: Called the pt to let her know that we could not get her in with any Drs in [place]. She said she will go back to Dr Tia Masker office. She will schedule her own appt. Initial call taken by: Carlton Adam,  April 04, 2009 12:39 PM  Follow-up for Phone Call        Lindsay Morse may have some suggestions. I think that is reasonable. Follow-up by: Hannah Beat MD,  April 04, 2009 1:38 PM

## 2010-04-17 NOTE — Assessment & Plan Note (Signed)
Summary: 3-4 WEEK FOLLOW UP/[facility]   Vital Signs:  Patient profile:   55 year old female Height:      64 inches Weight:      194.2 pounds BMI:     33.45 Temp:     97.6 degrees F oral Pulse rate:   110 / minute Pulse rhythm:   regular BP sitting:   140 / 90  (left arm) Cuff size:   regular  Vitals Entered By: Benny Lennert CMA Duncan Dull) (March 30, 2009 2:32 PM)  History of Present Illness: Chief complaint 3 to 4 week follow up  55 year old female:  Fibromyalgia Depression: her pressures relatively stable. Fibromyalgia is poorly controlled. The patient continues to have significant pain throughout the day and night.  She is pacing the floors. She is having some relief from Voltaren gel. We have tried many different types of medications, with tried multiple rounds of antidepressants, neuropathic pain agents,  anti-inflammatories,  and essentially everything that I noted do, and the patient has been recalcitrant to therapy.  Was recently, we have attempted to titrate off of some of her sedating medications with some good success, however, is starting  a newer antidepressant and neuropathic agent, Savella, she is developed some tachycardia.  Tachycardia -recent echo normal onset within the last month.  She does not feel as if her heart is skipping beats or his if she is having any  significant chest pain, however her heart is racing  much of the time, it has increased with increased dosage of this new medication  No sudafed or other cold products   Allergies (verified): No Known Drug Allergies  Past History:  Past medical, surgical, family and social histories (including risk factors) reviewed, and no changes noted (except as noted below).  Past Medical History: Reviewed history from 11/15/2008 and no changes required. OA - hips, back HTN Fibromyalgia h/o Depression  h/o excessive ETOH use, cut back now, no rehab Suicidal gesture, > 20 years ago Hyperlipidemia Chronic  small vessel ischemic changes, pons (small T2 change), Neuro felt not c/w MS  Psych = [facility]  Past Surgical History: Reviewed history from 11/25/2007 and no changes required. CTS, 2007 R Gall Bladder, Dr. Clydie Braun Colonoscopy, 2008, polyps, repeat q5 years  Family History: Reviewed history from 11/25/2007 and no changes required. Mother - 53, d/c ischemic bowel, HTN, back pain, uterine CA, borderline DM Father, alcoholic, CAD, MI Family History of Alcoholism/Addiction Family History of Colon CA 1st degree relative <60, Aunt - 17's  Sister, CAD, 3 cardiac procedures, Chrohn's Dz Sister - thyroid dysfuction  DM, multiple  Social History: Reviewed history from 11/25/2007 and no changes required. December moved from [facility] Married, [place] 2 children - 31, 34 (step -40) Current Smoker, 70 pack years Alcohol use-yes, 1-2 wine/week Drug use-no Regular exercise-yes, 30 min, twice a week  Review of Systems      See HPI General:  Complains of fatigue. MS:  Complains of joint pain, loss of strength, low back pain, mid back pain, muscle aches, muscle, cramps, muscle weakness, and stiffness. Psych:  Complains of depression.  Physical Exam  General:  Well-developed,well-nourished,in no acute distress; alert,appropriate and cooperative throughout examination Head:  Normocephalic and atraumatic without obvious abnormalities. No apparent alopecia or balding. Eyes:  vision grossly intact.   Ears:  no external deformities.   Nose:  no external deformity.   Mouth:  Oral mucosa and oropharynx without lesions or exudates.  Teeth in good repair. Neck:  No  deformities, masses, or tenderness noted. Lungs:  Normal respiratory effort, chest expands symmetrically. Lungs are clear to auscultation, no crackles or wheezes. Heart:  tachycardic at approximately 115no murmur, no gallop, and no rub.   Extremities:  No clubbing, cyanosis, edema, or deformity noted with normal full  range of motion of all joints.   Neurologic:  alert & oriented X3 and gait normal.   Cervical Nodes:  No lymphadenopathy noted Psych:  Cognition and judgment appear intact. Alert and cooperative with normal attention span and concentration. No apparent delusions, illusions, hallucinations   Impression & Recommendations:  Problem # 1:  SUPRAVENTRICULAR TACHYCARDIA (ICD-427.89) EKG: Sinus tachycardia to 110. Normal axis, normal R wave progression, No acute ST elevation or depression.   patient did have an episode of chest pain  within the last day it was short lived.  Additionally, whether tachycardia, think she needs to have an evaluation for coronary disease.  She would be unable to exercise whatsoever on the treadmill,  and going to obtain an adenosine Myoview to violate for coronary disease.   Additionally, I'm going to increase her Corag. Apparently she is the only been taking this once a day, however is typically a b.i.d. medication.. We can titrate up her beta blocker if tachycardia persists  Her updated medication list for this problem includes:    Carvedilol 3.125 Mg Tabs (Carvedilol) .Marland Kitchen... 1 by mouth two times a day  Orders: Cardiolite (Cardiolite) EKG w/ Interpretation (93000)  Problem # 2:  CHEST PAIN UNSPECIFIED (ICD-786.50)  Orders: Cardiolite (Cardiolite) EKG w/ Interpretation (93000)  Problem # 3:  FIBROMYALGIA (ICD-729.1) I am honestly not sure what to do to assist in the care of this problem.  We have attempted everything that I noted to  help this patient,, without any significant success.  I'm going to consult one of the rheumatologists  who have recently completed Fellowship to see if they have any recommendations that could potentially help this very nice lady.  Her entire rheumatological work-up has been normal in terms of laboratories.  Orders: Rheumatology Referral (Rheumatology)  Complete Medication List: 1)  Omeprazole 40 Mg Cpdr (Omeprazole) .... Take  one tablet daily 2)  Klonopin 0.5 Mg Tabs (Clonazepam) .Marland Kitchen.. 1 by mouth at night 3)  Gabapentin 800 Mg Tabs (Gabapentin) .Marland Kitchen.. 1 by mouth three times a day 4)  Voltaren 1 % Gel (Diclofenac sodium) .... Apply as directed 4 times daily 5)  Carvedilol 3.125 Mg Tabs (Carvedilol) .Marland Kitchen.. 1 by mouth two times a day 6)  Xyzal 5 Mg Tabs (Levocetirizine dihydrochloride) .... One a day 7)  Zolpidem Tartrate 10 Mg Tabs (Zolpidem tartrate) .... 1/2 or 1 by mouth at hs prn 8)  Spiriva Handihaler 18 Mcg Caps (Tiotropium bromide monohydrate) .Marland Kitchen.. 1 inh  daily 9)  Savella Titration Pack 12.5 & 25 & 50 Mg Misc (Milnacipran hcl) .... Use as directed 10)  Zoloft 25 Mg Tabs (Sertraline hcl) .Marland Kitchen.. 1 by mouth daily  Patient Instructions: 1)  f/u 3 weeks after stress test and off savella Prescriptions: ZOLOFT 25 MG TABS (SERTRALINE HCL) 1 by mouth daily  #30 x 3   Entered and Authorized by:   Hannah Beat MD   Signed by:   Hannah Beat MD on 03/30/2009   Method used:   Print then Give to Patient   RxID:   [id number] SAVELLA TITRATION PACK 12.5 & 25 & 50 MG MISC (MILNACIPRAN HCL) Use as directed  #1 x 0   Entered and Authorized by:  Hannah Beat MD   Signed by:   Hannah Beat MD on 03/30/2009   Method used:   Print then Give to Patient   RxID:   [id number] CARVEDILOL 3.125 MG TABS (CARVEDILOL) 1 by mouth two times a day  #180 x 1   Entered and Authorized by:   Hannah Beat MD   Signed by:   Hannah Beat MD on 03/30/2009   Method used:   Print then Give to Patient   RxID:   [id number] SPIRIVA HANDIHALER 18 MCG CAPS (TIOTROPIUM BROMIDE MONOHYDRATE) 1 inh  daily  #1 x 6   Entered by:   Benny Lennert CMA (AAMA)   Authorized by:   Hannah Beat MD   Signed by:   Hannah Beat MD on 03/30/2009   Method used:   Print then Give to Patient   RxID:   [id number] VOLTAREN 1 % GEL (DICLOFENAC SODIUM) Apply as directed 4 times daily  #1 x 5   Entered by:   Benny Lennert CMA  (AAMA)   Authorized by:   Hannah Beat MD   Signed by:   Hannah Beat MD on 03/30/2009   Method used:   Print then Give to Patient   RxID:   [id number]   Current Allergies (reviewed today): No known allergies

## 2010-04-17 NOTE — Letter (Signed)
[facility]   Imported By: Sherian Rein 09/25/2009 11:28:36  _____________________________________________________________________  External Attachment:    Type:   Image     Comment:   External Document

## 2010-04-17 NOTE — Progress Notes (Signed)
Summary: Nuclear Pre-Procedure  Phone Note Outgoing Call Call back at [facility] Phone [phone]   Call placed by: Stanton Kidney, EMT-P,  April 06, 2009 1:36 PM Call placed to: Patient Action Taken: Phone Call Completed Summary of Call: Reviewed information on Myoview Information Sheet (see scanned document for further details).  Spoke with Patient.    Nuclear Med Background Indications for Stress Test: Evaluation for Ischemia   History: Echo  History Comments: 11/10 Echo: EF=55-60%, mild LVH  Symptoms: Chest Pain, Rapid HR, SOB    Nuclear Pre-Procedure Cardiac Risk Factors: Family History - CAD, Hypertension, Lipids, Smoker Height (in): 64

## 2010-04-17 NOTE — Progress Notes (Signed)
Summary: Back pain  Phone Note Call from Patient   Caller: Patient Call For: Hannah Beat MD Summary of Call: Pt called, would like results of culture, would like a call today.Daine Gip  October 02, 2009 2:35 PM  Initial call taken by: Daine Gip,  October 02, 2009 2:36 PM  Follow-up for Phone Call        please let her know that urine culture is normal.  I honestly do not know why she keeps having urinary symptoms without infections. Urological input would not be unreasonable Follow-up by: Hannah Beat MD,  October 02, 2009 4:04 PM  Additional Follow-up for Phone Call Additional follow up Details #1::        Patient notified as instructed by telephone. Patient saw her neurologist today and he is doing a MRI on her brain Friday at [facility] in [place]. Patient asked the neurologist to order a MRI on her back and he declined stating that he was just seeing for her brain problem. Patient said that her back has gone out again and that she is having pain that goes down into her legs and needs for this to be checked out also. Patient requested that Dr. Patsy Lager call her. Additional Follow-up by: Sydell Axon LPN,  October 02, 2009 4:19 PM    Additional Follow-up for Phone Call Additional follow up Details #2::    Noted -- I am ok with simply having her have back MRI, i have evaluated before and recall well. Reasonable in her situation. Follow-up by: Hannah Beat MD,  October 02, 2009 5:58 PM   Appended Document: Back pain Patient notified that Shirlee Limerick will be in touch with her to schedule the MRI of her back per Dr. Patsy Lager.

## 2010-04-17 NOTE — Miscellaneous (Signed)
Summary: zofran/prevacid-rx  Clinical Lists Changes  Medications: Added new medication of ZOFRAN 4 MG  TABS (ONDANSETRON HCL) take one by mouth every 6-8 as needed nausea/esophagus spasms - Signed Added new medication of PREVACID 30 MG  CPDR (LANSOPRAZOLE) 1 capsule twice a day 30 minutes before meals - Signed Rx of ZOFRAN 4 MG  TABS (ONDANSETRON HCL) take one by mouth every 6-8 as needed nausea/esophagus spasms;  #20 x 0;  Signed;  Entered by: Greer Ee RN;  Authorized by: Hart Carwin MD;  Method used: Electronically to [facility] [address] [place], [place]  [address], Ph: [phone], Fax: [phone] Rx of PREVACID 30 MG  CPDR (LANSOPRAZOLE) 1 capsule twice a day 30 minutes before meals;  #60 x 1;  Signed;  Entered by: Greer Ee RN;  Authorized by: Hart Carwin MD;  Method used: Electronically to [facility]*, [address] [place], [place]  [address], Ph: [phone], Fax: [phone]    Prescriptions: PREVACID 30 MG  CPDR (LANSOPRAZOLE) 1 capsule twice a day 30 minutes before meals  #60 x 1   Entered by:   Greer Ee RN   Authorized by:   Hart Carwin MD   Signed by:   Greer Ee RN on 09/01/2009   Method used:   Electronically to        [facility]* (retail)       [address]       [place], [place]  [address]       Ph: [phone]       Fax: [phone]   RxID:   [id number] ZOFRAN 4 MG  TABS (ONDANSETRON HCL) take one by mouth every 6-8 as needed nausea/esophagus spasms  #20 x 0   Entered by:   Greer Ee RN   Authorized by:   Hart Carwin MD   Signed by:   Greer Ee RN on 09/01/2009   Method used:   Electronically to        [facility]* (retail)       [address]       [place], [place]  [address]       Ph: [phone]       Fax: [phone]   RxID:   [id number]

## 2010-04-17 NOTE — Progress Notes (Signed)
Summary: Lindsay Morse very upset  Phone Note Call from Lindsay Morse   Caller: Lindsay Morse Call For: Lindsay Beat MD Summary of Call: Lindsay Morse is calling very upset saying that she was treated like she was a "drug addict" because when she asked Dr. Hollice Espy for a MRI of her back since he was doing the neck he was looking through some of the information that was sent and said that she had signed a contract with Dr. Patsy Lager and he was not going to give her any pain medicine. Lindsay Morse says that there must be something in the office notes that you sent to make it seem like she is a drug seeker. Lindsay Morse would like a phone call from you when ever you get chance.Consuello Masse CMA   Initial call taken by: Benny Lennert CMA Duncan Dull),  October 16, 2009 9:05 AM  Follow-up for Phone Call        Lindsay Morse, please help with this communication.  For all patients who are in controlled substances contract with me, I put CONTROLLED SUBSTANCES CONTRACT WITH DR. Patsy Lager, THEN THE DATE SIGNED (This includes tramadol and Klonopin that she had been using)  Under social history, also put "former heavy alcohol, now quit"  Since medical school, I have been doing this with all patients as most physicians do.   Can call back in a week or so if she still has questions. Certainly, I have never called her a drug addict. Follow-up by: Lindsay Beat MD,  October 17, 2009 6:40 AM  Additional Follow-up for Phone Call Additional follow up Details #1::        Forwarded to [facility] for her input if she has any given Dr. Patsy Lager out of office.   Additional Follow-up by: Lindsay Nora MD,  October 17, 2009 10:38 AM    Additional Follow-up for Phone Call Additional follow up Details #2::    I explained this to pt, she is asking that Dr. Patsy Lager call her when he returns to the offic.             Lindsay Morse CMA  October 17, 2009 1:13 PM  I know this Lindsay Morse well and will handle when I am back in town. She has an MRI that she did not  understands nursings explanation of through me, and will take care of it when I am home.  nonemergent. i should handle this. Lindsay Beat MD  October 18, 2009 7:02 AM   Attempted call, no answer, no answering machine Lindsay Beat MD  October 20, 2009 3:04 PM   Additional Follow-up for Phone Call Additional follow up Details #3:: Details for Additional Follow-up Action Taken: Discussed. Reviewed all aspects of MRI of lumbar and cervical spine (cervical not ordered by me) with Lindsay Morse. 30 minute conversation. Lindsay Morse upset that Dr. Hollice Espy at [facility] - bad interaction face to face with Lindsay Morse. She is planning on seeing Dr. Danielle Dess at [facility] to discuss her neck and lumbar spine which is very reasonable.   Asked for all medical records, which is also reasonable.   Certainly, I do not think this Lindsay Morse is a pain medication seeker. In fact, the opposite. Has repetively declined narcotics. I am not clear how she feels that I insinuated this -- to my memory and knowledge that is not the case. Additional Follow-up by: Lindsay Beat MD,  October 23, 2009 8:43 AM

## 2010-04-17 NOTE — Consult Note (Signed)
Summary: [facility]   Imported By: Sherian Rein 11/29/2009 09:28:46  _____________________________________________________________________  External Attachment:    Type:   Image     Comment:   External Document

## 2010-04-17 NOTE — Consult Note (Signed)
Summary: [facility]  [facility]   Imported By: Maryln Gottron 12/25/2009 15:46:52  _____________________________________________________________________  External Attachment:    Type:   Image     Comment:   External Document  Appended Document: [facility] cog due to fatigue and pain

## 2010-04-17 NOTE — Progress Notes (Signed)
  Phone Note Outgoing Call   Call placed by: Verne Carrow, MD,  April 14, 2009 12:33 PM Call placed to: Patient Summary of Call: Please see phone notes from 04/12/09 regarding patient's rash, stress test and Holter monitor. I have reviewed the Holter monitor and there was sinus rhythm with PACs, several 4 beat runs of SVT but no atrial fib/flutter, VT or pauses. I called the patient to discuss this but she was not home. I spoke to her husband and told him that I would call back later.  Initial call taken by: Verne Carrow, MD,  April 14, 2009 12:35 PM  Follow-up for Phone Call        Attempted to call pt back at 4pm but no answer. cdm Follow-up by: Verne Carrow, MD,  April 14, 2009 4:10 PM  Additional Follow-up for Phone Call Additional follow up Details #1::        I was able to reach the patient. We spoke for 15 minutes about the events surrounding her rash after the Lexiscan stress test last week. There was clearly a communication problem between me, our office and the patient last week. I apologized for this. I reviewed her stress results and Holter results today. Her Holter was in our [place] office and not yet read last week when she was asking for results. I do not think that she needs further cardiac workup. Her chest pain is atypical and most likely non-cardiac. Her rash is improved although her skin is still dry. She has promised me that she will seek medical attention if she has any further issues with the skin rash.  Additional Follow-up by: Verne Carrow, MD,  April 17, 2009 1:59 PM

## 2010-04-17 NOTE — Letter (Signed)
Summary: [id number]  [id number]   Imported By: Lester Garrison 12/04/2009 09:23:21  _____________________________________________________________________  External Attachment:    Type:   Image     Comment:   External Document

## 2010-04-17 NOTE — Assessment & Plan Note (Signed)
Summary: NEW PT; CHEST PAIN   Visit Type:  Initial Consult Primary Provider:  Copland  CC:  chest pain/ sob (more symptomatic with increased heart rate)/ swelling in legs/ feels like heart races.  History of Present Illness: 55 yo WF with history of fibromyalgia, depression, HTN, hyperlipidemia, tobacco abuse and strong family history of CAD who is here today for further evaluation of chest pain. She tells me that she has noticed the chest discomfort for several years. It initially began with tachycardia/palpitations/diaphoresis while sitting at work. This resolved over the last year. Recently, over the last two months, she has noticed a mild ache in the center of her chest while at rest that is associated with "racing heart". It makes her feel dyspneic, diaphoretic and dizzy. These episodes happen daily and last for ten minutes. She has had no syncope. She has had no prior cardiac issues. She had a cardiac stress test in [place] two years ago and was told it was normal. She continues to smoke daily. She did have an echo in November that showed normal LV function with midl LVH and no significant valvular abnormalities. A pharmacological stress test has been arranged in our [place] office next week.   Preventive Screening-Counseling & Management  Alcohol-Tobacco     Alcohol drinks/day: <1     Alcohol type: wine     Smoking Status: current     Packs/Day: 0.5     Year Started: 1970  Caffeine-Diet-Exercise     Caffeine use/day: 3     Does Patient Exercise: no  Current Medications (verified): 1)  Omeprazole 40 Mg Cpdr (Omeprazole) .... Take One Tablet Daily 2)  Klonopin 0.5 Mg Tabs (Clonazepam) .Marland Kitchen.. 1 By Mouth At Night 3)  Gabapentin 800 Mg Tabs (Gabapentin) .Marland Kitchen.. 1 By Mouth Three Times A Day 4)  Voltaren 1 % Gel (Diclofenac Sodium) .... Apply As Directed 4 Times Daily As Needed 5)  Carvedilol 3.125 Mg Tabs (Carvedilol) .... 2 By Mouth Two Times A Day 6)  Xyzal 5 Mg Tabs (Levocetirizine  Dihydrochloride) .... One A Day 7)  Spiriva Handihaler 18 Mcg Caps (Tiotropium Bromide Monohydrate) .Marland Kitchen.. 1 Inh  Daily 8)  Zoloft 25 Mg Tabs (Sertraline Hcl) .Marland Kitchen.. 1 By Mouth Daily  Allergies (verified): No Known Drug Allergies  Past History:  Past Medical History: OA - hips, back HTN Fibromyalgia h/o Depression  h/o excessive ETOH use, cut back now, no rehab Suicidal gesture, > 20 years ago Hyperlipidemia Chronic small vessel ischemic changes, pons (small T2 change), Neuro felt not c/w MS Tobacco abuse  Psych = [facility]  Past Surgical History: Reviewed history from 11/25/2007 and no changes required. CTS, 2007 R Gall Bladder, Dr. Clydie Braun Colonoscopy, 2008, polyps, repeat q5 years  Family History: Reviewed history from 11/25/2007 and no changes required. Mother - 33, d/c ischemic bowel, HTN, back pain, uterine CA, borderline DM Father, alcoholic, CAD, MI Family History of Alcoholism/Addiction Family History of Colon CA 1st degree relative <60, Aunt - 73's  Sister, CAD, first MI at age 21, 3 cardiac procedures, Chrohn's Dz Sister - thyroid dysfuction  DM, multiple  Social History: Reviewed history from 11/25/2007 and no changes required. December moved from [facility] Married, [place] 2 children - 31, 34 (step -40) Current Smoker, 70 pack years (down to 1/2 ppd) Alcohol use-yes, 1-2 glasses of wine every two weeks Drug use-no Regular exercise-yes, 30 min, twice a week Alcohol drinks/day:  <1 Packs/Day:  0.5 Caffeine use/day:  3 Does Patient Exercise:  no  Review of Systems       The patient complains of fatigue, malaise, chest pain, palpitations, shortness of breath, joint pain, and dizziness.  The patient denies fever, weight gain/loss, vision loss, decreased hearing, hoarseness, prolonged cough, wheezing, sleep apnea, coughing up blood, abdominal pain, blood in stool, nausea, vomiting, diarrhea, heartburn, incontinence, blood in urine, muscle  weakness, leg swelling, rash, skin lesions, headache, fainting, depression, anxiety, enlarged lymph nodes, easy bruising or bleeding, and environmental allergies.    Vital Signs:  Patient profile:   55 year old female Height:      64 inches Weight:      196.75 pounds Pulse rate:   83 / minute Pulse rhythm:   regular BP sitting:   122 / 80  (left arm) Cuff size:   regular  Vitals Entered By: Charlena Cross, RN, BSN (April 07, 2009 2:14 PM)  Physical Exam  General:  General: Well developed, well nourished, NAD HEENT: OP clear, mucus membranes moist SKIN: warm, dry Neuro: No focal deficits Musculoskeletal: Muscle strength 5/5 all ext Psychiatric: Mood and affect normal Neck: No JVD, no carotid bruits, no thyromegaly, no lymphadenopathy. Lungs:Clear bilaterally, no wheezes, rhonci, crackles CV: RRR no murmurs, gallops rubs Abdomen: soft, NT, ND, BS present Extremities: No edema, pulses 2+.    Echocardiogram  Procedure date:  02/14/2009  Findings:      - Left ventricle: The cavity size was normal. Wall thickness was       increased in a pattern of mild LVH. Systolic function was normal.       The estimated ejection fraction was in the range of 55% to 60%.     - Left atrium: The atrium was mildly dilated.  EKG  Procedure date:  04/07/2009  Findings:      NSR, rate 83 bpm. Low voltage.  Impression & Recommendations:  Problem # 1:  CHEST PAIN UNSPECIFIED (ICD-786.50) Her pain has typical and atypical features. It is difficult to say if her symptoms are driven by an arrhythmia. She has multiple risk factors for CAD including HTN, hyperlipidemia, tobacco abuse and a strong family history of CAD. I agree with proceeding with the stress myoview next week to risk stratify. Her history of fibromyalgia also makes this difficult to diagnose.   Her updated medication list for this problem includes:    Carvedilol 3.125 Mg Tabs (Carvedilol) .Marland Kitchen... 2 by mouth two times a  day  Problem # 2:  PALPITATIONS (ICD-785.1) Etiology unclear. Will have her wear a 48 hour Holter monitor as these episodes are occuring daily.   Her updated medication list for this problem includes:    Carvedilol 3.125 Mg Tabs (Carvedilol) .Marland Kitchen... 2 by mouth two times a day  Problem # 3:  TOBACCO ABUSE (ICD-305.1) Smoking cessation encouraged.  Patient Instructions: 1)  Your physician recommends that you schedule a follow-up appointment in: 2-3 weeks 2)  Your physician has requested that you have a myoview.  For further information please visit [email or web address]  Please follow instruction sheet, as given. Monday 3)  Your physician has recommended that you wear a holter monitor.  Holter monitors are medical devices that record the heart's electrical activity. Doctors most often use these monitors to diagnose arrhythmias. Arrhythmias are problems with the speed or rhythm of the heartbeat. The monitor is a small, portable device. You can wear one while you do your normal daily activities. This is usually used to diagnose what is causing palpitations/syncope (passing out). Take off Sunday at 2:55pm.  Remember  to take the battery out.  Return to [employer] at [facility]. at time of stress test.

## 2010-04-17 NOTE — Consult Note (Signed)
Summary: [facility]   Imported By: Sherian Rein 02/20/2010 09:09:53  _____________________________________________________________________  External Attachment:    Type:   Image     Comment:   External Document

## 2010-04-17 NOTE — Medication Information (Signed)
Summary: Clarification for Voltaren Gel/[facility] by Mail  Clarification for Voltaren Gel/Meds by Mail   Imported By: Lanelle Bal 04/21/2009 10:11:28  _____________________________________________________________________  External Attachment:    Type:   Image     Comment:   External Document

## 2010-04-17 NOTE — Progress Notes (Signed)
Summary: elevated BP and fast heart rate  Phone Note Call from Patient Call back at [phone]   Caller: Patient Call For: Dr. Ermalene Searing Summary of Call: Pt took her BP and it is 157/102 and pulse is 109. Pt is out of Savella and when pt checked on refill it was $130.00. Pt would lilke another med instead of Savella. Pt has heart stress test scheduled for 04/10/09.  Pt is presently takaing Carvedilol 3.125 mg one tablet twice a day.  Initial call taken by: Lewanda Rife LPN,  April 05, 2009 4:54 PM  Follow-up for Phone Call        Per past 03/30/2009 note SVT.Marland Kitchen? triggered by new med.Amada Jupiter. Okay to stay off savella as appears Dr. Patsy Lager intended. Has stress test scheduled for heart eval 1/24. Recommend doubling carvedilol to improve HR and BP..increase to carvedilol 3.125 mg  TWO tab by mouth two times a day. Will have Dr. Patsy Lager review and make other recommendations tommorow given he knows pt better.  If BP not improving and HR continues to be 100 or higher..go to ER.  Additional Follow-up for Phone Call Additional follow up Details #1::         Pt understood instructions and will wait to hear back after Dr. Cyndie Chime return on 04/06/09 unless pt's condition changes and she will let us know.    Additional Follow-up for Phone Call Additional follow up Details #2::    Needs to taper off Savella. Agree with Coreg increase. Will call. Follow-up by: Hannah Beat MD,  April 05, 2009 6:17 PM  Additional Follow-up for Phone Call Additional follow up Details #3:: Details for Additional Follow-up Action Taken: Stable, pulse 105, BP 145/90. I am going to have cardiology see her.  I am not sure if this could be a Savella reaction. Normal on EKG other than increased rate.  Increased B blockade Adenosine myoview on Monday Additional Follow-up by: Hannah Beat MD,  April 06, 2009 2:08 PM

## 2010-04-17 NOTE — Progress Notes (Signed)
Summary: clarification on voltaren gel  Phone Note From Pharmacy   Caller: [facility] Summary of Call: Pharmacy is asking for clarification on voltaren gel, they are asking if pt should use 2 or 4 grams four times daily as needed.  Form is on your desk.  Initial call taken by: [name] CMA,  November 24, 2009 3:08 PM  Follow-up for Phone Call        4 grams, 4 times daily Follow-up by: Hannah Beat MD,  November 25, 2009 3:06 PM  Additional Follow-up for Phone Call Additional follow up Details #1::        Form faxed. Additional Follow-up by: [name] CMA,  November 27, 2009 8:31 AM

## 2010-04-17 NOTE — Medication Information (Signed)
Summary: Clarification for Voltaren Gel/Meds by Mail  Clarification for Voltaren Gel/Meds by Mail   Imported By: Lanelle Bal 04/19/2009 14:08:22  _____________________________________________________________________  External Attachment:    Type:   Image     Comment:   External Document

## 2010-04-17 NOTE — Progress Notes (Signed)
Summary: PHI  PHI   Imported By: Harlon Flor 04/10/2009 11:26:26  _____________________________________________________________________  External Attachment:    Type:   Image     Comment:   External Document

## 2010-04-17 NOTE — Progress Notes (Signed)
Summary: pt is asking for MRI results  Phone Note Call from Patient Call back at Home Phone [phone] Call back at [phone]   Caller: Patient Call For: Hannah Beat MD Summary of Call: Please review MRI report and advise. Initial call taken by: [name] CMA,  October 13, 2009 10:49 AM  Follow-up for Phone Call        Diffuse arthritic changes in the joints between back. Levels L2-S1 there is multilevel disk herniation - MILD.   This is a nonsurgical spine MRI, but real cause for her pain.  She has been on many medications in the past for fibromylagia that have not worked that well -- many can be used with this. Failed steroids.  If she is continuing to do poorly with regards to her back and in severe pain, a round of epidural steroid injections could be considered.  I would personally see Sheran Luz at [facility] if I were in the same predicament.  Sheran Luz at [facility], Naaman Plummer at [facility], Claria Dice at [facility] would be the only interventional spine physiatrists in this area. None in [place]. PMR preferable to pain management in this case.]   Dr. Ermalene Searing, if patient would like, please refer to Sheran Luz in my absence. Non-emergent. Follow-up by: Hannah Beat MD,  October 15, 2009 1:27 PM     Appended Document: Orders Update Patient advised and would like to speak to you about the MRI if would please call her asap.Consuello Masse CMA     >30 minute conversation. See phone note. Hannah Beat MD  October 23, 2009 8:40 AM   Clinical Lists Changes  Orders: Added new Referral order of Misc. Referral (Misc. Ref) - Signed

## 2010-04-17 NOTE — Letter (Signed)
Summary: [facility] Biopsy Results  [facility]  [address] [place], [place] [address]   Phone: [phone]  Fax: [phone]        September 07, 2009 MRN: [id number]    [facility] [address] [name], [place]  [address]    Dear Lindsay Morse,  I am pleased to inform you that the biopsies taken during your recent endoscopic examination did not show any evidence of cancer upon pathologic examination.Mild inflammation gue to reflux  Additional information/recommendations:  __No further action is needed at this time.  Please follow-up with      your primary care physician for your other healthcare needs.  __ Please call [phone] to schedule a return visit to review      your condition.  _x_ Continue with the treatment plan as outlined on the day of your      exam.If You continue to have difficulty with swallowing, I would suggest to  obtain a Barium study of Your esophagus.  __   Please call us if you are having persistent problems or have questions about your condition that have not been fully answered at this time.  Sincerely,  Hart Carwin MD  This letter has been electronically signed by your physician.  Appended Document: Patient Notice-Endo Biopsy Results letter mailed.

## 2010-04-17 NOTE — Procedures (Signed)
[facility]: [facility]   Imported By: Sherian Rein 09/25/2009 11:32:43  _____________________________________________________________________  External Attachment:    Type:   Image     Comment:   External Document

## 2010-04-17 NOTE — Letter (Signed)
Summary: [facility]  [facility]   Imported By: Sherian Rein 12/01/2009 15:15:44  _____________________________________________________________________  External Attachment:    Type:   Image     Comment:   External Document

## 2010-04-17 NOTE — Progress Notes (Signed)
Summary: Follow Up from Cardiology  Phone Note Other Incoming   Caller: Roxy Horseman, Cardiology Details for Reason: Rash Follow-Up Summary of Call: Called to follow up on pt concern and she will work with staff on increasing awareness of patients seen in [place].    Initial call taken by: Clarisa Schools,  April 18, 2009 12:39 PM

## 2010-04-17 NOTE — Procedures (Signed)
Summary: Upper Endoscopy  Patient: Lindsay Morse Note: All result statuses are Final unless otherwise noted.  Tests: (1) Upper Endoscopy (EGD)   EGD Upper Endoscopy       DONE     [facility]     [address]     [place], [place]  [address]           ENDOSCOPY PROCEDURE REPORT           PATIENT:  Lindsay, Morse  MR#:  [id number]     BIRTHDATE:  July 07, 1955, 53 yrs. old  GENDER:  female           ENDOSCOPIST:  Hedwig Morton. Juanda Chance, MD     Referred by:  Elpidio Galea. Copland, M.D.           PROCEDURE DATE:  09/01/2009     PROCEDURE:  EGD with biopsy, Maloney Dilation of Esophagus     ASA CLASS:  Class II     INDICATIONS:  dysphagia, GERD, nausea and vomiting recent onset of     dysphagia to solids and liquids, has been on antibiotics,           MEDICATIONS:   Versed 10 mg, Fentanyl 100 mcg, Benadryl 25 mg     TOPICAL ANESTHETIC:  Exactacain Spray           DESCRIPTION OF PROCEDURE:   After the risks benefits and     alternatives of the procedure were thoroughly explained, informed     consent was obtained.  The [facility] [id number]-H180 [id number] endoscope was     introduced through the mouth and advanced to the second portion of     the duodenum, without limitations.  The instrument was slowly     withdrawn as the mucosa was fully examined.     <<PROCEDUREIMAGES>>           A sessile polyp was found in the mid esophagus. 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  Normal GE junction was     noted. no evidence of stricture or Candida esophagitis With     standard forceps, a biopsy was obtained and sent to pathology (see     image4 and image5). maloney dilator r/o Barrett's esophagus     73F Maloney dil passed without difficulty  Duodenitis was found.     mild patch erythema in the duodenal bulb With standard forceps, a     biopsy was obtained and sent to pathology (see image2 and image1).     Otherwise the  examination was normal (see image3).    Retroflexed     views revealed no abnormalities.    The scope was then withdrawn     from the patient and the procedure completed.           COMPLICATIONS:  None           ENDOSCOPIC IMPRESSION:     1) Sessile polyp in the mid esophagus     2) Normal GE junction     3) Duodenitis     4) Otherwise normal examination     s/p passage of 73F Maloney dilator, no evidence of a stricture     RECOMMENDATIONS:     1) Await biopsy results     increase Prevacid 30 mg to 1 po bid, # 60, 1 refill     complete Diflucan  100 mg po qd x 5 days     Zofran 4 mg, #20, 1 po q6-8 hrs prn nausea/esophagus spasm     consider Barium esophagram to assess motility           REPEAT EXAM:  In 0 year(s) for.           ______________________________     Hedwig Morton. Juanda Chance, MD           CC:           n.     eSIGNED:   Hedwig Morton. Brodie at 09/01/2009 05:44 PM           Lindsay Morse, [id number]  Note: An exclamation mark (!) indicates a result that was not dispersed into the flowsheet. Document Creation Date: 09/01/2009 5:45 PM _______________________________________________________________________  (1) Order result status: Final Collection or observation date-time: 09/01/2009 17:25 Requested date-time:  Receipt date-time:  Reported date-time:  Referring Physician:   Ordering Physician: Lina Sar [id number]) Specimen Source:  Source: Launa Grill Order Number: [id number] Lab site:

## 2010-04-17 NOTE — Assessment & Plan Note (Signed)
Summary: MED REFILL/DLO   Vital Signs:  Patient profile:   56 year old female Height:      64 inches Weight:      193.4 pounds BMI:     33.32 Temp:     97.5 degrees F oral Pulse rate:   80 / minute Pulse rhythm:   regular BP sitting:   110 / 60  (left arm) Cuff size:   large  Vitals Entered By: Benny Lennert CMA Duncan Dull) (June 26, 2009 8:28 AM)  History of Present Illness: Chief complaint med refills  55 year old female:  Fibromyalgia Walking, still having a lot of pain. Terrible pain in the tops of her feet. Stomache will get upset with what she is eating.   IBS: Stomache will tear up - has to get a bowel movement.  Now more predominant.  Had a half sister with crohn's disease happended off and on, but mostly in the last two months.  Dep: mood stable  Lipids: tol Lipitor    Allergies: 1)  ! * Lexiscan 2)  * Savella  Past History:  Past medical, surgical, family and social histories (including risk factors) reviewed, and no changes noted (except as noted below).  Past Medical History: OA - hips, back IBS HTN Fibromyalgia h/o Depression  h/o excessive ETOH use, cut back now, no rehab Suicidal gesture, > 20 years ago Hyperlipidemia Chronic small vessel ischemic changes, pons (small T2 change), Neuro felt not c/w MS Tobacco abuse  Rheum = Dr. Christell Faith [facility]  Past Surgical History: Reviewed history from 11/25/2007 and no changes required. CTS, 2007 R Gall Bladder, Dr. Clydie Braun Colonoscopy, 2008, polyps, repeat q5 years  Family History: Reviewed history from 04/07/2009 and no changes required. Mother - 41, d/c ischemic bowel, HTN, back pain, uterine CA, borderline DM Father, alcoholic, CAD, MI Family History of Alcoholism/Addiction Family History of Colon CA 1st degree relative <60, Aunt - 52's  Sister, CAD, first MI at age 33, 3 cardiac procedures, Chrohn's Dz Sister - thyroid dysfuction  DM, multiple  Social History: Reviewed history  from 04/07/2009 and no changes required. December moved from [facility] Married, [place] 2 children - 31, 34 (step -40) Current Smoker, 70 pack years (down to 1/2 ppd) Alcohol use-yes, 1-2 glasses of wine every two weeks Drug use-no Regular exercise-yes, 30 min, twice a week  Review of Systems       as above no f, chills, sweats no cp, no sob  Physical Exam  Additional Exam:  GEN: WDWN, NAD, Non-toxic, A & O x 3 HEENT: Atraumatic, Normocephalic. Neck supple. No masses, No LAD. Ears and Nose: No external deformity. CV: RRR, No M/G/R. No JVD. No thrill. No extra heart sounds. PULM: CTA B, no wheezes, crackles, rhonchi. No retractions. No resp. distress. No accessory muscle use. EXTR: No c/c/e NEURO: Normal gait.  PSYCH: Normally interactive. Conversant. Not depressed or anxious appearing.  Calm demeanor.     Impression & Recommendations:  Problem # 1:  IBS (ICD-564.1) Assessment New New diagnosis  reviewed diet changes first - could make some sense with mood issues colonoscopy normal 4 years ago  Problem # 2:  ARTHRALGIA (ICD-719.40)  Orders: T-Vitamin D (25-Hydroxy) (56387-56433) T-CK Total (29518-84166) T- * Misc. Laboratory test [phone])  Problem # 3:  HYPERLIPIDEMIA (ICD-272.4)  Orders: Venipuncture (60109) TLB-Lipid Panel (80061-LIPID)  Problem # 4:  ENCOUNTER FOR LONG-TERM USE OF OTHER MEDICATIONS (ICD-V58.69)  Orders: TLB-BMP (Basic Metabolic Panel-BMET) (80048-METABOL) TLB-CBC Platelet - w/Differential (85025-CBCD) TLB-Hepatic/Liver Function Pnl (  80076-HEPATIC) TLB-Rheumatoid Factor (RA) (21308-MV)  Problem # 5:  FIBROMYALGIA (ICD-729.1) Assessment: Improved  Complete Medication List: 1)  Omeprazole 40 Mg Cpdr (Omeprazole) .... Take one tablet daily 2)  Klonopin 0.5 Mg Tabs (Clonazepam) .Marland Kitchen.. 1 by mouth at night 3)  Gabapentin 800 Mg Tabs (Gabapentin) .Marland Kitchen.. 1 by mouth three times a day 4)  Voltaren 1 % Gel (Diclofenac sodium) .... Apply as directed 4  times daily as needed 5)  Carvedilol 3.125 Mg Tabs (Carvedilol) .Marland Kitchen.. 1 by mouth two times a day 6)  Xyzal 5 Mg Tabs (Levocetirizine dihydrochloride) .... One a day 7)  Spiriva Handihaler 18 Mcg Caps (Tiotropium bromide monohydrate) .Marland Kitchen.. 1 inh  daily 8)  Zoloft 25 Mg Tabs (Sertraline hcl) .Marland Kitchen.. 1 by mouth daily  Patient Instructions: 1)  f/u 6 months Prescriptions: XYZAL 5 MG TABS (LEVOCETIRIZINE DIHYDROCHLORIDE) one a day  #90 x 3   Entered and Authorized by:   Hannah Beat MD   Signed by:   Hannah Beat MD on 06/26/2009   Method used:   Print then Give to Patient   RxID:   [id number] KLONOPIN 0.5 MG TABS (CLONAZEPAM) 1 by mouth at night  #90 x 1   Entered and Authorized by:   Hannah Beat MD   Signed by:   Hannah Beat MD on 06/26/2009   Method used:   Print then Give to Patient   RxID:   [id number]   Current Allergies (reviewed today): ! Eugenie Birks * SAVELLA  Appended Document: Orders Update    Clinical Lists Changes  Orders: Added new Service order of Specimen Handling (74259) - Signed Added new Test order of TLB-CK Total Only(Creatine Kinase/CPK) (82550-CK) - Signed

## 2010-04-17 NOTE — Assessment & Plan Note (Signed)
Summary: ?UTI/CLE   Vital Signs:  Patient profile:   55 year old female Height:      64 inches Weight:      186.2 pounds BMI:     32.08 Temp:     98.1 degrees F oral Pulse rate:   76 / minute Pulse rhythm:   regular BP sitting:   118 / 80  (left arm) Cuff size:   large  Vitals Entered By: Benny Lennert CMA Duncan Dull) (August 16, 2009 12:36 PM)  History of Present Illness: Chief complaint ?uti  issues and complaints.  Primary complaint: Question if she still has her urinary tract infection. She did take her course of ciprofloxacin, and did feel  better during part of the course of this medication, her nausea did get better. She continues to have back pain  and her CVA area.  Unfortunately, I did not send the urine culture and sensitivity at her initial presentation.  She is not having dysuria, and she does have altered pain sensations with her fibromyalgia and has multiple areas of pain ongoing chronically.  Weight loss - patient hhas lost about 20 pounds  Trouble swallowing, rule out smoker so, the patient has a progressive difficulty swallowing liquids and solids. This is not acute and she feels like this has been progressing over some time, at least a month. She does have a significant history for smoking, greater than 50 pack years and does have a significant history of alcohol,, but this is more than 20 years ago. She also does have some reflux and has been on  Prilosec.  nausea, IBS colon patient was having significant diarrhea and abdominal discomfort at her last office visit, and I radically increased her fiber intake and had her start some Metamucil, and this did help somewhat, however and I will be constipated, and continues to have  significant abdominal pain particularly epigastric region.  had a colonoscopy a few years ago with polyp  Allergies: 1)  ! * Lexiscan 2)  * Savella  Past History:  Past medical, surgical, family and social histories (including risk factors)  reviewed, and no changes noted (except as noted below).  Past Medical History: OA - hips, back IBS HTN Fibromyalgia h/o Depression  h/o excessive ETOH use, cut back now, no rehab Suicidal gesture, > 20 years ago Hyperlipidemia Chronic small vessel ischemic changes, pons (small T2 change), Neuro felt not c/w MS Tobacco abuse  CONTROLLED SUBSTANCE CONTRACT, DR. Patsy Lager, 08/16/2009  Rheum = Dr. Christell Faith Neu[facility]  Past Surgical History: Reviewed history from 11/25/2007 and no changes required. CTS, 2007 R Gall Bladder, Dr. Clydie Braun Colonoscopy, 2008, polyps, repeat q5 years  Family History: Reviewed history from 04/07/2009 and no changes required. Mother - 44, d/c ischemic bowel, HTN, back pain, uterine CA, borderline DM Father, alcoholic, CAD, MI Family History of Alcoholism/Addiction Family History of Colon CA 1st degree relative <60, Aunt - 52's  Sister, CAD, first MI at age 52, 3 cardiac procedures, Chrohn's Dz Sister - thyroid dysfuction  DM, multiple  Social History: Reviewed history from 04/07/2009 and no changes required. December moved from [facility] Married, [place] 2 children - 31, 34 (step -40) Current Smoker, 70 pack years (down to 1/2 ppd) Alcohol use-yes, 1-2 glasses of wine every two weeks Drug use-no Regular exercise-yes, 30 min, twice a week  Review of Systems      See HPI  Physical Exam  General:  Well-developed,well-nourished,in no acute distress; alert,appropriate and cooperative throughout examination Head:  Normocephalic and  atraumatic without obvious abnormalities. No apparent alopecia or balding. Ears:  no external deformities.   Nose:  no external deformity.   Lungs:  Normal respiratory effort, chest expands symmetrically. Lungs are clear to auscultation, no crackles or wheezes. Heart:  Normal rate and regular rhythm. S1 and S2 normal without gallop, murmur, click, rub or other extra sounds. Abdomen:  minimally tender. soft, normal bowel  sounds, no distention, no masses, no guarding, no rigidity, and no rebound tenderness.    CVAT + B Neurologic:  alert & oriented X3 and gait normal.   Psych:  Cognition and judgment appear intact. Alert and cooperative with normal attention span and concentration. No apparent delusions, illusions, hallucinations   Impression & Recommendations:  Problem # 1:  UTI (ICD-599.0) Worrisome - ? resistant organism send for culture, change to 14 d septra  Her updated medication list for this problem includes:    Sulfamethoxazole-tmp Ds 800-160 Mg Tabs (Sulfamethoxazole-trimethoprim) .Marland Kitchen... 1 by mouth two times a day  Orders: T-Culture, Urine ([id number]) Specimen Handling (09811)  Problem # 2:  OTHER DYSPHAGIA (BJY-782.95) Assessment: New new prob concerning with risk factors, ++ tob, h/o + ETOH with ongoing abd pain, nausea, poor control IBS, GERD, I am going to consult Gastroenterology.  Primary reason for consult: suspect need for endoscopic evaluation with above. ? ongoing gastritis vs. ulcer as well, but also with IBS Any help with pt's IBS appreciated.  Orders: Venipuncture (62130) Gastroenterology Referral (GI) TLB-B12, Serum-Total ONLY (86578-I69) TLB-TSH (Thyroid Stimulating Hormone) (84443-TSH) TLB-H. Pylori Abs(Helicobacter Pylori) (86677-HELICO)  Problem # 3:  IBS (ICD-564.1)  Orders: Gastroenterology Referral (GI)  Problem # 4:  NAUSEA (ICD-787.02)  Her updated medication list for this problem includes:    Xyzal 5 Mg Tabs (Levocetirizine dihydrochloride) ..... One a day  Problem # 5:  TOBACCO ABUSE (ICD-305.1)  Problem # 6:  FIBROMYALGIA (ICD-729.1)  Her updated medication list for this problem includes:    Tramadol Hcl 50 Mg Tabs (Tramadol hcl) .Marland Kitchen..Marland Kitchen Two times a day  Complete Medication List: 1)  Lansoprazole 30 Mg Cpdr (Lansoprazole) .Marland Kitchen.. 1 by mouth daily 2)  Klonopin 0.5 Mg Tabs (Clonazepam) .Marland Kitchen.. 1 by mouth at night 3)  Gabapentin 800 Mg Tabs  (Gabapentin) .Marland Kitchen.. 1 by mouth three times a day 4)  Voltaren 1 % Gel (Diclofenac sodium) .... Apply as directed 4 times daily as needed 5)  Carvedilol 3.125 Mg Tabs (Carvedilol) .Marland Kitchen.. 1 by mouth two times a day 6)  Xyzal 5 Mg Tabs (Levocetirizine dihydrochloride) .... One a day 7)  Spiriva Handihaler 18 Mcg Caps (Tiotropium bromide monohydrate) .Marland Kitchen.. 1 inh  daily 8)  Zoloft 25 Mg Tabs (Sertraline hcl) .Marland Kitchen.. 1 by mouth daily 9)  Tramadol Hcl 50 Mg Tabs (Tramadol hcl) .... Two times a day 10)  Levsin 0.125 Mg Tabs (Hyoscyamine sulfate) .Marland Kitchen.. 1 by mouth 2 times daily as needed for abdominal pain / cramping 11)  Sulfamethoxazole-tmp Ds 800-160 Mg Tabs (Sulfamethoxazole-trimethoprim) .Marland Kitchen.. 1 by mouth two times a day  Patient Instructions: 1)  Referral Appointment Information 2)  Day/Date: 3)  Time: 4)  Place/MD: 5)  Address: 6)  Phone/Fax: 7)  Patient given appointment information. Information/Orders faxed/mailed.  Prescriptions: SULFAMETHOXAZOLE-TMP DS 800-160 MG TABS (SULFAMETHOXAZOLE-TRIMETHOPRIM) 1 by mouth two times a day  #28 x 0   Entered and Authorized by:   Hannah Beat MD   Signed by:   Hannah Beat MD on 08/16/2009   Method used:   Print then Give to Patient   RxID:  [id number] LANSOPRAZOLE 30 MG CPDR (LANSOPRAZOLE) 1 by mouth daily  #30 x 11   Entered and Authorized by:   Hannah Beat MD   Signed by:   Hannah Beat MD on 08/16/2009   Method used:   Print then Give to Patient   RxID:   [id number]   Current Allergies (reviewed today): ! Eugenie Birks [facility]  Laboratory Results   Urine Tests  Date/Time Received: August 16, 2009 12:44 PM  Date/Time Reported: August 16, 2009 12:44 PM   Routine Urinalysis   Color: yellow Appearance: Hazy Glucose: negative   (Normal Range: Negative) Bilirubin: negative   (Normal Range: Negative) Ketone: negative   (Normal Range: Negative) Spec. Gravity: 1.020   (Normal Range: 1.003-1.035) Blood: moderate   (Normal  Range: Negative) pH: 5.0   (Normal Range: 5.0-8.0) Protein: trace   (Normal Range: Negative) Urobilinogen: 0.2   (Normal Range: 0-1) Nitrite: negative   (Normal Range: Negative) Leukocyte Esterace: moderate   (Normal Range: Negative)

## 2010-04-17 NOTE — Progress Notes (Signed)
Summary: Billilng Problem  Phone Note Call from Patient Call back at Home Phone [phone]   Caller: Patient Call For: DR BRODIE Reason for Call: Talk to Nurse Summary of Call: Patient would like to speak to director regarding billing issues. Patient states that Peggy from billing told her that she needed to speak to our office and that she needed to bring her insurance card personally to their office. Patient really upset because she lives in [place] and she did provide a copy of her card when she came to pre-visit. Initial call taken by: Tawni Levy,  October 17, 2009 5:05 PM  Follow-up for Phone Call        We have a copy of Ms. Bevis insurance card in [employer] so no need for her to fax/drop off a copy of it.  I called Greg Cutter in [place] and asked him if Gigi Gin has access to [employer] to see insurance cards.  He said she should.  He will follow-up with her and have someone research/resubmit the claim to [facility].  I called Ms. Toppin, apologized for the confusion and informed her that the claim would be resubmitted.  I told her to contact me if she has any other problems. Follow-up by: Clarnce Flock,  October 18, 2009 8:52 AM

## 2010-04-17 NOTE — Letter (Signed)
Summary: EGD Instructions  [facility]  [address] [place], [place] [address]   Phone: [phone]  Fax: [phone]       Lindsay Morse    08-Jan-1956    MRN: [id number]       Procedure Day /Date: 09/01/09 Thursday     Arrival Time: 3:00 pm     Procedure Time: 4:00 pm     Location of Procedure:                    _ x _ [facility] (4th Floor)  PREPARATION FOR ENDOSCOPY   On 09/01/09 THE DAY OF THE PROCEDURE:  1.   No solid foods, milk or milk products are allowed after midnight the night before your procedure.  2.   Do not drink anything colored red or purple.  Avoid juices with pulp.  No orange juice.  3.  You may drink clear liquids until 2:00 pm, which is 2 hours before your procedure.                                                                                                CLEAR LIQUIDS INCLUDE: Water Jello Ice Popsicles Tea (sugar ok, no milk/cream) Powdered fruit flavored drinks Coffee (sugar ok, no milk/cream) Gatorade Juice: apple, white grape, white cranberry  Lemonade Clear bullion, consomm, broth Carbonated beverages (any kind) Strained chicken noodle soup Hard Candy   MEDICATION INSTRUCTIONS  Unless otherwise instructed, you should take regular prescription medications with a small sip of water as early as possible the morning of your procedure.                  OTHER INSTRUCTIONS  You will need a responsible adult at least 54 years of age to accompany you and drive you home.   This person must remain in the waiting room during your procedure.  Wear loose fitting clothing that is easily removed.  Leave jewelry and other valuables at home.  However, you may wish to bring a book to read or an iPod/MP3 player to listen to music as you wait for your procedure to start.  Remove all body piercing jewelry and leave at home.  Total time from sign-in until discharge is approximately 2-3 hours.  You should go  home directly after your procedure and rest.  You can resume normal activities the day after your procedure.  The day of your procedure you should not:   Drive   Make legal decisions   Operate machinery   Drink alcohol   Return to work  You will receive specific instructions about eating, activities and medications before you leave.    The above instructions have been reviewed and explained to me by   Lamona Curl CMA Duncan Dull)  August 30, 2009 8:48 AM     I fully understand and can verbalize these instructions _____________________________ Date 08/30/09

## 2010-04-17 NOTE — Assessment & Plan Note (Signed)
Summary: Cardiology Nuclear Study  Nuclear Med Background Indications for Stress Test: Evaluation for Ischemia   History: Echo  History Comments: 11/10 Echo: EF=55-60%, mild LVH  Symptoms: Chest Pain, Chest Tightness, Diaphoresis, DOE, Fatigue, Rapid HR, SOB    Nuclear Pre-Procedure Cardiac Risk Factors: Family History - CAD, Hypertension, Lipids, Smoker Caffeine/Decaff Intake: none NPO After: 9:00 PM Lungs: clear IV 0.9% NS with Angio Cath: 20g     IV Site: (R) wrist IV Started by: Stanton Kidney EMT-P Chest Size (in) 38     Cup Size C     Height (in): 64 Weight (lb): 197 BMI: 33.94 Tech Comments: On Sunday April 09, 2009 the patient experienced chest tightness with radiation to the jaw. She also became diaphoretic with this episode.  Nuclear Med Study 1 or 2 day study:  1 day     Stress Test Type:  Eugenie Birks Reading MD:  Dietrich Pates, MD     Referring MD:  Karleen Hampshire Copland Resting Radionuclide:  Technetium 72m Tetrofosmin     Resting Radionuclide Dose:  10.2 mCi  Stress Radionuclide:  Technetium 28m Tetrofosmin     Stress Radionuclide Dose:  33.0 mCi   Stress Protocol   Lexiscan: 0.4 mg   Stress Test Technologist:  Milana Na EMT-P     Nuclear Technologist:  Burna Mortimer Deal RT-N  Rest Procedure  Myocardial perfusion imaging was performed at rest 45 minutes following the intravenous administration of Myoview Technetium 78m Tetrofosmin.  Stress Procedure  The patient received IV Lexiscan 0.4 mg over 15-seconds.  Myoview injected at 30-seconds.  There were no significant changes, but she had chest tightness with radiation to her jaw with infusion.  Quantitative spect images were obtained after a 45 minute delay.  QPS Raw Data Images:  Imageswere motion corrected.  Note soft tissue (diaphragm, breast) surround heart. Stress Images:  There is normal uptake in all areas. Rest Images:  Normal homogeneous uptake in all areas of the myocardium. Subtraction (SDS):  No evidence of  ischemia. Transient Ischemic Dilatation:  .94  (Normal <1.22)  Lung/Heart Ratio:  .33  (Normal <0.45)  Quantitative Gated Spect Images QGS EDV:  60 ml QGS ESV:  16 ml QGS EF:  74 %   Overall Impression  Exercise Capacity: Lexiscan BP Response: Normal blood pressure response. Clinical Symptoms: Mild chest pain/dyspnea. ECG Impression: No significant ST segment change suggestive of ischemia. Overall Impression: Normal stress nuclear study.  Appended Document: Cardiology Nuclear Study Call pt  Her stress test was perfectly normal without any signs of heart disease. We will see what the holter monitor that Dr. Camillo Flaming ordered looks like  Appended Document: Cardiology Nuclear Study patient advised.Consuello Masse CMA

## 2010-04-17 NOTE — Progress Notes (Signed)
Summary: rash  Phone Note Call from Patient   Summary of Call: pt had lexiscan yesterday.  Today has noted a rash all over face, arms, and under chest.  Rash burns and itches.  Instructed pt to take benedryl as needed for itching and to report lexiscan as an intolerance for future testing.   Initial call taken by: Charlena Cross, RN, BSN,  April 11, 2009 4:49 PM

## 2010-04-17 NOTE — Progress Notes (Signed)
Summary: reaction from dye- stress test  Phone Note From Other Clinic   Caller: nikki from  [place] office. [phone] Request: Talk with Nurse Details for Reason: pt having reaction to dye, had stress test 1/24 .  Summary of Call:   Initial call taken by: Lorne Skeens,  April 12, 2009 11:33 AM  Follow-up for Phone Call        [facility] ,  got voicemail.  Left message to call back Letta Moynahan, EMT  April 12, 2009 11:53 AM   Lowella Bandy returning call to nurse. [phone] Asher Muir Little  April 13, 2009 8:27 AM  Additional Follow-up for Phone Call Additional follow up Details #1::        SPOKE WITH PT RASH APPEARS TO HAVE IMPROVED CONT TO TAKE BENADRYL.  ALSO WANTING HOLTER RESULTS INSTRUCTED NOT REVIEWED YET ONCE REVIEWED WILL CALL WITH RESULTS.  FYI PT NOT HAPPY WITH CARE AND WISHES TO CX APPT WITH DR Clifton James. QUESTIONING WHY MD DID NOT CALL AND CHECK ON PT WHEN CALLED WITH REACTION TO LEXISCAN. Additional Follow-up by: Scherrie Bateman, LPN,  April 13, 2009 11:13 AM    Additional Follow-up for Phone Call Additional follow up Details #2::    Please see other phone note. I will locate Holter and call pt. cdm Follow-up by: Verne Carrow, MD,  April 14, 2009 12:08 PM

## 2010-04-17 NOTE — Letter (Signed)
Summary: [facility]  [facility]   Imported By: Lanelle Bal 07/26/2009 10:30:08  _____________________________________________________________________  External Attachment:    Type:   Image     Comment:   External Document

## 2010-04-17 NOTE — Progress Notes (Signed)
Summary: ? allergy to [facility]  Phone Note Call from Patient Call back at [phone]   Caller: Patient Call For: Hannah Beat MD Summary of Call: Pt had drug induced stress test on heart on 04/10/09 at [facility]. When received injection at time of test had h/a, jaw pain, cut air off and pt began to cough. Those symptoms went away.  Pt said like she has been burned going down arms on inside of arm and top of breast. Pt said also has red bumps on face and legs. Legs are itching. Skin all over body seems to be dried out. Spoke with [facility] yesterday and was told probably allergic reaction to [place]. Pt has been taking Benadryl. Benadryl is not helping. Pt is not having trouble breathing. Pt uses [facility] [phone] if needed. Please advise.  Initial call taken by: Lewanda Rife LPN,  April 12, 2009 9:01 AM  Follow-up for Phone Call        I really know minimal about reactions to nuclear stress tests - she has seen Dr. Camillo Flaming. Can you call Cardiology triage and ask for their assistance and recommendations and have them follow-up with patient. Follow-up by: Hannah Beat MD,  April 12, 2009 9:19 AM  Additional Follow-up for Phone Call Additional follow up Details #1::        Spoke to [facility] at [facility], [phone] she will have the nurse call me back regarding Ms. Peppers.  Linde Gillis CMA Duncan Dull)  April 12, 2009 11:34 AM   Madaline Guthrie back and was left on hold, will call back later.  Linde Gillis CMA Duncan Dull)  April 12, 2009 2:25 PM   Spoke to receptionist at [facility] and Lupita Leash is not there today, but she will have a nurse call me back regarding Ms. Marich.  Linde Gillis CMA Duncan Dull)  April 13, 2009 8:28 AM  Spoke with patient this morning, she said that she awaited a call from our office yesterday and no one called her back.  Explained to her that I had left several messages with [facility] for a nurse to call me back  regarding her reaction.  She said it was not acceptable and that she waited at home yesterday to hear from [place] an no one called her.  She says that she hopes we don't treat all of our patients like we did her, and that she feels like Dr. Patsy Lager could have called her.  She is very upset and would like a call back from Dr. Patsy Lager.   Additional Follow-up by: Linde Gillis CMA Duncan Dull),  April 13, 2009 11:14 AM    Additional Follow-up for Phone Call Additional follow up Details #2::    I spoke at lunch  to Ms. Pooley for a long time to address her concerns. She was upset that no one called her back yesterday, particularly upset that the Cardiologists did not call her back after multiple calls. She had made multiple calls to [facility] and our office made multiple as well.  Additionally, she was very upset that her Holter Monitor has been apparently lost by [facility].  Additionally, she was upset and felt that when someone did talk to her on the phone today from Cardiology that they were rude to her.  She has cancelled her follow-up appointment with Dr. Camillo Flaming and will not see [facility] in the future.   I talked to her a long time, she is feeling better with regards  to Lexiscan reaction. Discussed the good news of her normal adenosine myoview, but I really feel that her Holter Monitor needs to be done with elevated pulses.   I spoke to Dr. Berton Mount about this on the phone this afternoon, who assured me that he would look into the matter and follow-up. I will cc: Dr. Camillo Flaming, too.  For follow-up, if abnormalities found, due to patient preference, I will have her follow-up with [facility], Dr. Elease Hashimoto is possible.  Follow-up by: Hannah Beat MD,  April 13, 2009 5:20 PM   Appended Document: ? allergy to Lexiscan I am just seeing this information and phone message chain now. I will try to locate the Holter monitor results and will let the patient as well as Dr. Patsy Lager know the  results. I am sorry that there was a breakdown in our communication chain with this patient. She is actually seen in our [place] office (initial visit last week on 04/07/09). She called our office on 04/11/09 and told our nurse in the [place] office about her rash. Per discussion with nursing and Dr. Shirlee Latch, she was told to take Benadryl and that the rash should be limited. From what I can tell, she then began calling Dr. Cyndie Chime office the next day.   I am sorry that this happened to this patient. I will pursue the Holter results today and will call the patient. I understand that she if frustrated and does not wish to see [place] again. I will follow up in the EMR after I locate the Holter and speak to the patient. We will make sure that all of the results are communicated to the patient.   cdm

## 2010-04-17 NOTE — Assessment & Plan Note (Signed)
Summary: WANTS BLOOD PRESSURE CHECKED  Nurse Visit   Vital Signs:  Patient profile:   55 year old female BP sitting:   130 / 78  (left arm) Cuff size:   regular  Allergies: No Known Drug Allergies

## 2010-04-17 NOTE — Consult Note (Signed)
Summary: [facility] @ [facility] @ [place]   Imported By: Lanelle Bal 10/24/2009 08:57:08  _____________________________________________________________________  External Attachment:    Type:   Image     Comment:   External Document  Appended Document: [facility] @ [facility] Not [place] per Dr. Hollice Espy  formal neuropsych testing if patient open to this.

## 2010-04-17 NOTE — Assessment & Plan Note (Signed)
Summary: DYSPHAGIA              Lindsay Morse   History of Present Illness Visit Type: Initial Consult Primary GI MD: Lina Sar MD Primary Provider: Danie Chandler, MD Requesting Provider: Hannah Beat, MD Chief Complaint: Dysphagia, with waves of nausea x 6-8 weeks History of Present Illness:   Lindsay Morse is a 55 year old white female who comes at the recommendation of Dr Karleen Hampshire Copland for evaluation of dysphagia. Patient complains of dysphagia to solids and liquids along with nausea and occasional vomiting. She has also had some loss of appetite along with generalized abdominal discomfort. Ms. Lindsay Morse has been seen by Dr Noland Fordyce, a Gastroenterologist in [place], [place] in the past. She had a colonoscopy in 03/2005 as well as an endoscopy which we do not yet have records on. We have requested those be sent to [place] for review. She has been on three antibiotics for 4 weeks for urinary tract infections. She denies any history of dysphagia. The current episodes began suddenly. She continues to smoke about 8 cigarettes a day. There is a positive family history of colon cancer in maternal aunt and her maternal grandfather. There was a colon polyp found on colonoscopy in January 2007 and she is supposed to have a recall colonoscopy in January 2012.   GI Review of Systems    Reports abdominal pain, belching, bloating, dysphagia with liquids, dysphagia with solids, loss of appetite, nausea, and  vomiting.     Location of  Abdominal pain: generalized.    Denies acid reflux, chest pain, heartburn, vomiting blood, weight loss, and  weight gain.      Reports constipation, diarrhea, and  irritable bowel syndrome.     Denies anal fissure, black tarry stools, change in bowel habit, diverticulosis, fecal incontinence, heme positive stool, hemorrhoids, jaundice, light color stool, liver problems, rectal bleeding, and  rectal pain.    Current Medications (verified): 1)  Lansoprazole 30 Mg Cpdr  (Lansoprazole) .Marland Kitchen.. 1 By Mouth Daily 2)  Klonopin 0.5 Mg Tabs (Clonazepam) .Marland Kitchen.. 1 By Mouth At Night 3)  Gabapentin 800 Mg Tabs (Gabapentin) .Marland Kitchen.. 1 By Mouth Three Times A Day 4)  Voltaren 1 % Gel (Diclofenac Sodium) .... Apply As Directed 4 Times Daily As Needed 5)  Carvedilol 3.125 Mg Tabs (Carvedilol) .Marland Kitchen.. 1 By Mouth Two Times A Day 6)  Xyzal 5 Mg Tabs (Levocetirizine Dihydrochloride) .... One A Day 7)  Spiriva Handihaler 18 Mcg Caps (Tiotropium Bromide Monohydrate) .Marland Kitchen.. 1 Inh  Daily 8)  Zoloft 25 Mg Tabs (Sertraline Hcl) .Marland Kitchen.. 1 By Mouth Daily 9)  Tramadol Hcl 50 Mg Tabs (Tramadol Hcl) .... Two Times A Day 10)  Levsin 0.125 Mg Tabs (Hyoscyamine Sulfate) .Marland Kitchen.. 1 By Mouth 2 Times Daily As Needed For Abdominal Pain / Cramping 11)  Sulfamethoxazole-Tmp Ds 800-160 Mg Tabs (Sulfamethoxazole-Trimethoprim) .Marland Kitchen.. 1 By Mouth Two Times A Day 12)  Centrum Silver Ultra Womens  Tabs (Multiple Vitamins-Minerals) .... Once Daily 13)  Phillips Colon Health  Caps (Probiotic Product) .... Once Daily 14)  Metamucil 0.52 Gm Caps (Psyllium) .... Every Other Day 15)  Vitamin D3 2000iu .... Once Daily  Allergies (verified): 1)  ! * Lexiscan 2)  Amada Jupiter  Past History:  Past Medical History: Reviewed history from 08/16/2009 and no changes required. OA - hips, back IBS HTN Fibromyalgia h/o Depression  h/o excessive ETOH use, cut back now, no rehab Suicidal gesture, > 20 years ago Hyperlipidemia Chronic small vessel ischemic changes, pons (small  T2 change), Neuro felt not c/w MS Tobacco abuse  CONTROLLED SUBSTANCE CONTRACT, DR. Patsy Lager, 08/16/2009  Rheum = Dr. [facility]  Past Surgical History: Reviewed history from 08/25/2009 and no changes required. CTS, 2007 R Gall Bladder, Dr. Clydie Braun Colonoscopy, 2008, polyps, [place] q5 years[place]   Family History: Reviewed history from 04/07/2009 and no changes required. Mother - 62, d/c ischemic bowel, HTN, back pain, uterine CA,  borderline DM Father, alcoholic, CAD, MI Family History of Alcoholism/Addiction Family History of Colon CA 1st degree relative <60, Aunt - 27's  Sister, CAD, first MI at age 37, 3 cardiac procedures, Chrohn's Dz Sister - thyroid dysfuction  DM, multiple  Social History: Reviewed history from 04/07/2009 and no changes required. December moved from [facility] Married, [place] 2 children - 31, 34 (step -40) Current Smoker, 70 pack years (down to 1/2 ppd) Alcohol use-yes, 1-2 glasses of wine every two weeks Drug use-no Regular exercise-yes, 30 min, twice a week  Review of Systems       The patient complains of allergy/sinus, arthritis/joint pain, back pain, fatigue, headaches-new, muscle pains/cramps, sleeping problems, and urine leakage.  The patient denies anemia, anxiety-new, blood in urine, breast changes/lumps, change in vision, confusion, cough, coughing up blood, depression-new, fainting, fever, hearing problems, heart murmur, heart rhythm changes, itching, menstrual pain, night sweats, nosebleeds, pregnancy symptoms, shortness of breath, skin rash, sore throat, swelling of feet/legs, swollen lymph glands, thirst - excessive , urination - excessive , urination changes/pain, vision changes, and voice change.         Pertinent positive and negative review of systems were noted in the above HPI. All other ROS was otherwise negative.   Vital Signs:  Patient profile:   55 year old female Height:      64 inches Weight:      187.13 pounds BMI:     32.24 Pulse rate:   84 / minute Pulse rhythm:   regular BP sitting:   116 / 70  (left arm) Cuff size:   regular  Vitals Entered By: June McMurray CMA Duncan Dull) (August 30, 2009 8:12 AM)  Physical Exam  General:  somewhat raspy voice. Frequent cough. Eyes:  PERRLA, no icterus. Mouth:  no thrush. tongue is coated. Neck:  Supple; no masses or thyromegaly. Lungs:  no wheezes or rales. Heart:  Regular rate and rhythm; no murmurs, rubs,  or  bruits. Abdomen:  protuberant abdomen. She is tender in all quadrants, especially epigastrium. Bowel sounds are normal active and there is no rebound and no palpable mass. Rectal:  soft Hemoccult negative stool. Extremities:  No clubbing, cyanosis, edema or deformities noted. Skin:  Intact without significant lesions or rashes. Psych:  Alert and cooperative. Normal mood and affect.   Impression & Recommendations:  Problem # 1:  OTHER DYSPHAGIA (ICD-787.29) Patient has had a rather acute onset of dysphagia to solids and liquids after being on antibiotics for 3 weeks. I am suspecting  Candida esophagitis. There is also a possibility of herpetic or CMV esophagitis but this is less likely since patient does not have odynophagia. She has chronic gastroesophageal reflux and she is a smoker. She also takes anti-inflammatory agents. All that may be contributing to her esophagitis. We will start her on Diflucan 100 mg p.o. q.d. x 5 days and schedule her for an upper endoscopy with biopsies and brushings.  Orders: EGD (EGD)  Problem # 2:  COLONIC POLYPS, HX OF (ICD-V12.72) Patient has a history of colon polyps although we do not have  a report to confirm this. Polyp was found in 2007 on a colonoscopy completed in [place]. She was told to have a colonoscopy in January 2012. We will send her a recall letter.  Patient Instructions: 1)  You have been scheduled for an endoscopy on Friday 09/01/09 @ 4 pm. You need to arrive at 3:00 pm for registration. 2)  We have sent a prescription for Diflucan to your pharmacy. You need to take 1 tablet daily x 5 days. 3)  We will enter you in our system for a reminder to be sent to you for colonoscopy January 2012. 4)  Copy sent to : Hannah Beat, MD 5)  The medication list was reviewed and reconciled.  All changed / newly prescribed medications were explained.  A complete medication list was provided to the patient / caregiver. Prescriptions: DIFLUCAN 100 MG TABS  (FLUCONAZOLE) Take 1 tablet by mouth once daily x 5 days  #5 x 0   Entered by:   Lamona Curl CMA (AAMA)   Authorized by:   Hart Carwin MD   Signed by:   Lamona Curl CMA (AAMA) on 08/30/2009   Method used:   Electronically to        [facility]* (retail)       [address]       [place], [place]  [address]       Ph: [phone]       Fax: [phone]   RxID:   [id number]

## 2010-04-17 NOTE — Consult Note (Signed)
Summary: [facility] @ [facility] @ [place]   Imported By: Lanelle Bal 10/06/2009 12:22:46  _____________________________________________________________________  External Attachment:    Type:   Image     Comment:   External Document

## 2010-04-17 NOTE — Assessment & Plan Note (Signed)
Summary: ALERGIC REACTION TO PREDNISONE / LFW   Vital Signs:  Patient profile:   55 year old female Height:      64 inches Weight:      188.4 pounds BMI:     32.46 Temp:     98.8 degrees F oral Pulse rate:   76 / minute Pulse rhythm:   regular BP sitting:   130 / 80  (left arm) Cuff size:   large  Vitals Entered By: Benny Lennert CMA Duncan Dull) (September 19, 2009 10:58 AM)  History of Present Illness: Chief complaint allergic reaction to prednisone   Started prednisone 11 days ago for back issues. prednsione taper 40 mg down.  Hands, feet and eyes numb in last 24 hours, cannot sleep for past 3 days.  Bloated all over. Jittery and anxious feeling..moody.  Dry cough..mild shortness of breath, mild dysphagia.  Has 5 more days of prednisone.  Back is improved.. no radaitng symptoms.  Able to stop flexeril and vicodin.    Only other change is flexeril and vicodin ( has tolerated in apst)...and higher dose of nexium per GI.   Problems Prior to Update: 1)  Back Pain, Lumbar, With Radiculopathy  (ICD-724.4) 2)  Colonic Polyps, Hx of  (ICD-V12.72) 3)  Other Dysphagia  (ICD-787.29) 4)  Uti  (ICD-599.0) 5)  Ibs  (ICD-564.1) 6)  Arthralgia  (ICD-719.40) 7)  Tobacco Abuse  (ICD-305.1) 8)  Peripheral Edema  (ICD-782.3) 9)  Sleep Disorder  (ICD-780.50) 10)  Hyperlipidemia  (ICD-272.4) 11)  Encounter For Long-term Use of Other Medications  (ICD-V58.69) 12)  Depression, Major, Recurrent, Severe  (ICD-296.33) 13)  Health Maintenance Exam  (ICD-V70.0) 14)  Other Screening Mammogram  (ICD-V76.12) 15)  Fibromyalgia  (ICD-729.1)  Current Medications (verified): 1)  Lansoprazole 30 Mg Cpdr (Lansoprazole) .Marland Kitchen.. 1 By Mouth Daily 2)  Klonopin 0.5 Mg Tabs (Clonazepam) .Marland Kitchen.. 1 By Mouth At Night 3)  Gabapentin 800 Mg Tabs (Gabapentin) .Marland Kitchen.. 1 By Mouth Three Times A Day 4)  Voltaren 1 % Gel (Diclofenac Sodium) .... Apply As Directed 4 Times Daily As Needed 5)  Carvedilol 3.125 Mg Tabs (Carvedilol)  .Marland Kitchen.. 1 By Mouth Two Times A Day 6)  Xyzal 5 Mg Tabs (Levocetirizine Dihydrochloride) .... One A Day 7)  Spiriva Handihaler 18 Mcg Caps (Tiotropium Bromide Monohydrate) .Marland Kitchen.. 1 Inh  Daily 8)  Zoloft 25 Mg Tabs (Sertraline Hcl) .Marland Kitchen.. 1 By Mouth Daily 9)  Levsin 0.125 Mg Tabs (Hyoscyamine Sulfate) .Marland Kitchen.. 1 By Mouth 2 Times Daily As Needed For Abdominal Pain / Cramping 10)  Sulfamethoxazole-Tmp Ds 800-160 Mg Tabs (Sulfamethoxazole-Trimethoprim) .Marland Kitchen.. 1 By Mouth Two Times A Day 11)  Centrum Silver Ultra Womens  Tabs (Multiple Vitamins-Minerals) .... Once Daily 12)  Phillips Colon Health  Caps (Probiotic Product) .... Once Daily 13)  Metamucil 0.52 Gm Caps (Psyllium) .... Every Other Day 14)  Vitamin D3 2000iu .... Once Daily 15)  Diflucan 100 Mg Tabs (Fluconazole) .... Take 1 Tablet By Mouth Once Daily X 5 Days 16)  Vicodin 5-500 Mg Tabs (Hydrocodone-Acetaminophen) .... One Tab By Mouth Two Times A Day 17)  Flexeril 10 Mg Tabs (Cyclobenzaprine Hcl) .Marland Kitchen.. 1 Tab By Mouth Three Times A Day As Needed For Back Pain. 18)  Zofran 4 Mg  Tabs (Ondansetron Hcl) .... Take One By Mouth Every 6-8 As Needed Nausea/esophagus Spasms 19)  Prevacid 30 Mg  Cpdr (Lansoprazole) .Marland Kitchen.. 1 Capsule Twice A Day 30 Minutes Before Meals 20)  Prednisone 10 Mg Tabs (Prednisone) .... 4 Tabs  By Mouth For 6 Days, Then 3 Tabs By Mouth For 4 Days, Then 2 Tabs By Mouth For 4 Days, Then 1 Tab By Mouth For 4 Days  Allergies: 1)  ! * Lexiscan 2)  * Savella  Past History:  Past medical, surgical, family and social histories (including risk factors) reviewed, and no changes noted (except as noted below).  Past Medical History: Reviewed history from 08/16/2009 and no changes required. OA - hips, back IBS HTN Fibromyalgia h/o Depression  h/o excessive ETOH use, cut back now, no rehab Suicidal gesture, > 20 years ago Hyperlipidemia Chronic small vessel ischemic changes, pons (small T2 change), Neuro felt not c/w MS Tobacco  abuse  CONTROLLED SUBSTANCE CONTRACT, DR. Patsy Lager, 08/16/2009  Rheum = Dr. [facility]  Past Surgical History: Reviewed history from 08/25/2009 and no changes required. CTS, 2007 R Gall Bladder, Dr. Clydie Braun Colonoscopy, 2008, polyps, [place] q5 [place]   Family History: Reviewed history from 04/07/2009 and no changes required. Mother - 21, d/c ischemic bowel, HTN, back pain, uterine CA, borderline DM Father, alcoholic, CAD, MI Family History of Alcoholism/Addiction Family History of Colon CA 1st degree relative <60, Aunt - 42's  Sister, CAD, first MI at age 58, 3 cardiac procedures, Chrohn's Dz Sister - thyroid dysfuction  DM, multiple  Social History: Reviewed history from 04/07/2009 and no changes required. December moved from [facility] Married, [place] 2 children - 31, 34 (step -40) Current Smoker, 70 pack years (down to 1/2 ppd) Alcohol use-yes, 1-2 glasses of wine every two weeks Drug use-no Regular exercise-yes, 30 min, twice a week  Review of Systems General:  Denies fatigue and fever. CV:  Denies chest pain or discomfort. Resp:  Denies sputum productive and wheezing. GI:  Denies abdominal pain.  Physical Exam  General:  Well-developed,well-nourished,in no acute distress; alert,appropriate and cooperative throughout examination Mouth:  Oral mucosa and oropharynx without lesions or exudates.  Teeth in good repair. Neck:  no carotid bruit or thyromegaly no cervical or supraclavicular lymphadenopathy  Lungs:  Normal respiratory effort, chest expands symmetrically. Lungs are clear to auscultation, no crackles or wheezes. Heart:  Normal rate and regular rhythm. S1 and S2 normal without gallop, murmur, click, rub or other extra sounds. Abdomen:  Bowel sounds positive,abdomen soft and non-tender without masses, organomegaly or hernias noted. Pulses:  R and L posterior tibial pulses are full and equal bilaterally  Extremities:  no edema Skin:  Intact  without suspicious lesions or rashes   Impression & Recommendations:  Problem # 1:  ADVERSE DRUG REACTION (ICD-995.20) Non emergent. Stop prednisone. Expect 5-7 days to improve symptoms. Call if not improving as expected.   Complete Medication List: 1)  Lansoprazole 30 Mg Cpdr (Lansoprazole) .Marland Kitchen.. 1 by mouth daily 2)  Klonopin 0.5 Mg Tabs (Clonazepam) .Marland Kitchen.. 1 by mouth at night 3)  Gabapentin 800 Mg Tabs (Gabapentin) .Marland Kitchen.. 1 by mouth three times a day 4)  Voltaren 1 % Gel (Diclofenac sodium) .... Apply as directed 4 times daily as needed 5)  Carvedilol 3.125 Mg Tabs (Carvedilol) .Marland Kitchen.. 1 by mouth two times a day 6)  Xyzal 5 Mg Tabs (Levocetirizine dihydrochloride) .... One a day 7)  Spiriva Handihaler 18 Mcg Caps (Tiotropium bromide monohydrate) .Marland Kitchen.. 1 inh  daily 8)  Zoloft 25 Mg Tabs (Sertraline hcl) .Marland Kitchen.. 1 by mouth daily 9)  Levsin 0.125 Mg Tabs (Hyoscyamine sulfate) .Marland Kitchen.. 1 by mouth 2 times daily as needed for abdominal pain / cramping 10)  Sulfamethoxazole-tmp Ds 800-160 Mg Tabs (Sulfamethoxazole-trimethoprim) .Marland KitchenMarland KitchenMarland Kitchen  1 by mouth two times a day 11)  Centrum Silver Ultra Womens Tabs (Multiple vitamins-minerals) .... Once daily 12)  Phillips Colon Health Caps (Probiotic product) .... Once daily 13)  Metamucil 0.52 Gm Caps (Psyllium) .... Every other day 14)  Vitamin D3 2000iu  .... Once daily 15)  Diflucan 100 Mg Tabs (Fluconazole) .... Take 1 tablet by mouth once daily x 5 days 16)  Vicodin 5-500 Mg Tabs (Hydrocodone-acetaminophen) .... One tab by mouth two times a day 17)  Flexeril 10 Mg Tabs (Cyclobenzaprine hcl) .Marland Kitchen.. 1 tab by mouth three times a day as needed for back pain. 18)  Zofran 4 Mg Tabs (Ondansetron hcl) .... Take one by mouth every 6-8 as needed nausea/esophagus spasms 19)  Prevacid 30 Mg Cpdr (Lansoprazole) .Marland Kitchen.. 1 capsule twice a day 30 minutes before meals 20)  Prednisone 10 Mg Tabs (Prednisone) .... 4 tabs by mouth for 6 days, then 3 tabs by mouth for 4 days, then 2 tabs by mouth  for 4 days, then 1 tab by mouth for 4 days  Patient Instructions: 1)  Stop predisone. 2)   If not improving as expected... call [facility] for follow up eval.   Current Allergies (reviewed today): ! * LEXISCAN * SAVELLA

## 2010-04-17 NOTE — Assessment & Plan Note (Signed)
Summary: BACK PAIN/DLO   Vital Signs:  Patient profile:   55 year old female Weight:      187.75 pounds Temp:     98.6 degrees F oral Pulse rate:   72 / minute Pulse rhythm:   regular BP sitting:   104 / 64  (left arm) Cuff size:   large  Vitals Entered By: Sydell Axon LPN (August 30, 2009 2:28 PM) CC: Back pain, pain goes down into the legs, history of back going out twice   History of Present Illness: "I feel like my back has gone out again.She has had acute problems in the past with CT done previously showing multiple level disc protrusion/bulging and foramen narrowing. She is having pain into the buttocks bilat and down the legs, L>R into  the ankle area. Her mother had back surgery with rod placement and she does not want surgery like her mother. We discussed smoking cessation in the mix. This started yesterday cleaning house, dusting and vacuuming. She is mildly overweight.  She is having EGD on Fri for swallowing difficulties.  Problems Prior to Update: 1)  Colonic Polyps, Hx of  (ICD-V12.72) 2)  Nausea  (ICD-787.02) 3)  Other Dysphagia  (ICD-787.29) 4)  Uti  (ICD-599.0) 5)  Ibs  (ICD-564.1) 6)  Arthralgia  (ICD-719.40) 7)  Tobacco Abuse  (ICD-305.1) 8)  Peripheral Edema  (ICD-782.3) 9)  Sleep Disorder  (ICD-780.50) 10)  Hyperlipidemia  (ICD-272.4) 11)  Encounter For Long-term Use of Other Medications  (ICD-V58.69) 12)  Depression, Major, Recurrent, Severe  (ICD-296.33) 13)  Health Maintenance Exam  (ICD-V70.0) 14)  Other Screening Mammogram  (ICD-V76.12) 15)  Fibromyalgia  (ICD-729.1)  Medications Prior to Update: 1)  Lansoprazole 30 Mg Cpdr (Lansoprazole) .Marland Kitchen.. 1 By Mouth Daily 2)  Klonopin 0.5 Mg Tabs (Clonazepam) .Marland Kitchen.. 1 By Mouth At Night 3)  Gabapentin 800 Mg Tabs (Gabapentin) .Marland Kitchen.. 1 By Mouth Three Times A Day 4)  Voltaren 1 % Gel (Diclofenac Sodium) .... Apply As Directed 4 Times Daily As Needed 5)  Carvedilol 3.125 Mg Tabs (Carvedilol) .Marland Kitchen.. 1 By Mouth Two Times A  Day 6)  Xyzal 5 Mg Tabs (Levocetirizine Dihydrochloride) .... One A Day 7)  Spiriva Handihaler 18 Mcg Caps (Tiotropium Bromide Monohydrate) .Marland Kitchen.. 1 Inh  Daily 8)  Zoloft 25 Mg Tabs (Sertraline Hcl) .Marland Kitchen.. 1 By Mouth Daily 9)  Tramadol Hcl 50 Mg Tabs (Tramadol Hcl) .... Two Times A Day 10)  Levsin 0.125 Mg Tabs (Hyoscyamine Sulfate) .Marland Kitchen.. 1 By Mouth 2 Times Daily As Needed For Abdominal Pain / Cramping 11)  Sulfamethoxazole-Tmp Ds 800-160 Mg Tabs (Sulfamethoxazole-Trimethoprim) .Marland Kitchen.. 1 By Mouth Two Times A Day 12)  Centrum Silver Ultra Womens  Tabs (Multiple Vitamins-Minerals) .... Once Daily 13)  Phillips Colon Health  Caps (Probiotic Product) .... Once Daily 14)  Metamucil 0.52 Gm Caps (Psyllium) .... Every Other Day 15)  Vitamin D3 2000iu .... Once Daily 16)  Diflucan 100 Mg Tabs (Fluconazole) .... Take 1 Tablet By Mouth Once Daily X 5 Days  Allergies: 1)  ! * Lexiscan 2)  * Savella  Physical Exam  General:  Well-developed,well-nourished,in mild to mod  acute distress due to back pain ; alert,appropriate and cooperative throughout examination Eyes:  Conjunctiva clear bilaterally.  Msk:  Wiast ROM flexion to 60degrees, ext to neutral, lat benfd to 30 degrees left, 45 degrees right , twist reasonably no pain to 40 degrees bilat. DTRs 2+ bilat, SLR pos at 45 degrees with pain in the lower back. Extremities:  No clubbing, cyanosis, edema, or deformity noted with normal full range of motion of all joints.     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 Discussed heat and ice aggressively....ice today as still fairly acute.  Discussed Tuyl and Flexeril with augnmentation with Vi9codin sparingly. Discussed postions chronically and no eccentric loading to include typical vacuuming technioque. Smoking cessation will help back heal and improve. Shown exercises after pain starts to improve. The following medications were  removed from the medication list:    Tramadol Hcl 50 Mg Tabs (Tramadol hcl) .Marland Kitchen..Marland Kitchen Two times a day Her updated medication list for this problem includes:    Vicodin 5-500 Mg Tabs (Hydrocodone-acetaminophen) ..... One tab by mouth two times a day    Flexeril 10 Mg Tabs (Cyclobenzaprine hcl) .Marland Kitchen... 1 tab by mouth three times a day as needed for back pain.  Problem # 2:  TOBACCO ABUSE (ICD-305.1) Assessment: Improved Encouraged continued cutting down to total abstinence.  Complete Medication List: 1)  Lansoprazole 30 Mg Cpdr (Lansoprazole) .Marland Kitchen.. 1 by mouth daily 2)  Klonopin 0.5 Mg Tabs (Clonazepam) .Marland Kitchen.. 1 by mouth at night 3)  Gabapentin 800 Mg Tabs (Gabapentin) .Marland Kitchen.. 1 by mouth three times a day 4)  Voltaren 1 % Gel (Diclofenac sodium) .... Apply as directed 4 times daily as needed 5)  Carvedilol 3.125 Mg Tabs (Carvedilol) .Marland Kitchen.. 1 by mouth two times a day 6)  Xyzal 5 Mg Tabs (Levocetirizine dihydrochloride) .... One a day 7)  Spiriva Handihaler 18 Mcg Caps (Tiotropium bromide monohydrate) .Marland Kitchen.. 1 inh  daily 8)  Zoloft 25 Mg Tabs (Sertraline hcl) .Marland Kitchen.. 1 by mouth daily 9)  Levsin 0.125 Mg Tabs (Hyoscyamine sulfate) .Marland Kitchen.. 1 by mouth 2 times daily as needed for abdominal pain / cramping 10)  Sulfamethoxazole-tmp Ds 800-160 Mg Tabs (Sulfamethoxazole-trimethoprim) .Marland Kitchen.. 1 by mouth two times a day 11)  Centrum Silver Ultra Womens Tabs (Multiple vitamins-minerals) .... Once daily 12)  Phillips Colon Health Caps (Probiotic product) .... Once daily 13)  Metamucil 0.52 Gm Caps (Psyllium) .... Every other day 14)  Vitamin D3 2000iu  .... Once daily 15)  Diflucan 100 Mg Tabs (Fluconazole) .... Take 1 tablet by mouth once daily x 5 days 16)  Vicodin 5-500 Mg Tabs (Hydrocodone-acetaminophen) .... One tab by mouth two times a day 17)  Flexeril 10 Mg Tabs (Cyclobenzaprine hcl) .Marland Kitchen.. 1 tab by mouth three times a day as needed for back pain.  Patient Instructions: 1)  Scriptsd sent. 2)  RTC if sxs don't  improve. Prescriptions: FLEXERIL 10 MG TABS (CYCLOBENZAPRINE HCL) 1 tab by mouth three times a day as needed for back pain.  #50 x 1   Entered and Authorized by:   Shaune Leeks MD   Signed by:   Shaune Leeks MD on 08/30/2009   Method used:   Print then Give to Patient   RxID:   [id number] VICODIN 5-500 MG TABS (HYDROCODONE-ACETAMINOPHEN) one tab by mouth two times a day  #50 x 1   Entered and Authorized by:   Shaune Leeks MD   Signed by:   Shaune Leeks MD on 08/30/2009   Method used:   Print then Give to Patient   RxID:   [id number]   Current Allergies (reviewed today): ! Eugenie Birks * SAVELLA

## 2010-04-17 NOTE — Assessment & Plan Note (Signed)
Summary: 9:00 bladder inf/dlo   Vital Signs:  Patient profile:   55 year old female Height:      64 inches Weight:      189.8 pounds BMI:     32.70 Temp:     97.9 degrees F oral Pulse rate:   76 / minute Pulse rhythm:   regular BP sitting:   130 / 70  (left arm) Cuff size:   large  Vitals Entered By: Benny Lennert CMA Duncan Dull) (Aug 02, 2009 9:03 AM)  History of Present Illness: Chief complaint ? bladder infection  UTI: Pain in huer suprapubic area. Sunday was feeling like she really needed to go to bathroom. nausous all evening. No fever, but not feeling well.  IBS: flared Also having some slight pain on the left with her bowels. Goin ot the BM about eight times. Very raw and irritated.   Urge incontinence: Have some occ incontinence. - getting worse  Fibro: still "miserable" but appears to be doing better. Reviewed all labs with her - did not fully understand Dr. Ashok Cordia explanation. Reviewed CXR which was normal.  Allergies: 1)  ! * Lexiscan 2)  * Savella  Past History:  Past medical, surgical, family and social histories (including risk factors) reviewed, and no changes noted (except as noted below).  Past Medical History: Reviewed history from 06/26/2009 and no changes required. OA - hips, back IBS HTN Fibromyalgia h/o Depression  h/o excessive ETOH use, cut back now, no rehab Suicidal gesture, > 20 years ago Hyperlipidemia Chronic small vessel ischemic changes, pons (small T2 change), Neuro felt not c/w MS Tobacco abuse  Rheum = Dr. Christell Faith [facility]  Past Surgical History: Reviewed history from 11/25/2007 and no changes required. CTS, 2007 R Gall Bladder, Dr. Clydie Braun Colonoscopy, 2008, polyps, repeat q5 years  Family History: Reviewed history from 04/07/2009 and no changes required. Mother - 34, d/c ischemic bowel, HTN, back pain, uterine CA, borderline DM Father, alcoholic, CAD, MI Family History of Alcoholism/Addiction Family History of Colon CA  1st degree relative <60, Aunt - 55's  Sister, CAD, first MI at age 73, 3 cardiac procedures, Chrohn's Dz Sister - thyroid dysfuction  DM, multiple  Social History: Reviewed history from 04/07/2009 and no changes required. December [facility] Married, [place] 2 children - 31, 34 (step -40) Current Smoker, 70 pack years (down to 1/2 ppd) Alcohol use-yes, 1-2 glasses of wine every two weeks Drug use-no Regular exercise-yes, 30 min, twice a week  Review of Systems General:  Complains of fatigue; denies sweats. GI:  See HPI; Complains of change in bowel habits, diarrhea, loss of appetite, and nausea; denies bloody stools and dark tarry stools. GU:  Complains of urinary frequency.  Physical Exam  General:  Well-developed,well-nourished,in no acute distress; alert,appropriate and cooperative throughout examination Head:  Normocephalic and atraumatic without obvious abnormalities. No apparent alopecia or balding. Ears:  no external deformities.   Nose:  no external deformity.   Lungs:  Normal respiratory effort, chest expands symmetrically. Lungs are clear to auscultation, no crackles or wheezes. Heart:  Normal rate and regular rhythm. S1 and S2 normal without gallop, murmur, click, rub or other extra sounds. Abdomen:  minimally tender. Hyperactive BS soft, normal bowel sounds, no distention, no masses, no guarding, no rigidity, and no rebound tenderness.    CVAT + Extremities:  No clubbing, cyanosis, edema, or deformity noted with normal full range of motion of all joints.   Neurologic:  alert & oriented X3 and gait normal.  Psych:  Cognition and judgment appear intact. Alert and cooperative with normal attention span and concentration. No apparent delusions, illusions, hallucinations   Impression & Recommendations:  Problem # 1:  UTI (ICD-599.0) Assessment New  Nausea, CVAT - treat for 10 days for ? early pyelo  Her updated medication list for this problem includes:     Cipro 250 Mg Tabs (Ciprofloxacin hcl) .Marland Kitchen... 1 by mouth 2 times daily  Orders: UA Dipstick w/o Micro (manual) ([id number])  Problem # 2:  IBS (ICD-564.1) Assessment: Deteriorated Probiotics More fiber - add metamucil  levsin as needed   Problem # 3:  FIBROMYALGIA (ICD-729.1) long discussion - stable for her  Her updated medication list for this problem includes:    Tramadol Hcl 50 Mg Tabs (Tramadol hcl) .Marland Kitchen..Marland Kitchen Two times a day  Complete Medication List: 1)  Omeprazole 40 Mg Cpdr (Omeprazole) .... Take one tablet daily 2)  Klonopin 0.5 Mg Tabs (Clonazepam) .Marland Kitchen.. 1 by mouth at night 3)  Gabapentin 800 Mg Tabs (Gabapentin) .Marland Kitchen.. 1 by mouth three times a day 4)  Voltaren 1 % Gel (Diclofenac sodium) .... Apply as directed 4 times daily as needed 5)  Carvedilol 3.125 Mg Tabs (Carvedilol) .Marland Kitchen.. 1 by mouth two times a day 6)  Xyzal 5 Mg Tabs (Levocetirizine dihydrochloride) .... One a day 7)  Spiriva Handihaler 18 Mcg Caps (Tiotropium bromide monohydrate) .Marland Kitchen.. 1 inh  daily 8)  Zoloft 25 Mg Tabs (Sertraline hcl) .Marland Kitchen.. 1 by mouth daily 9)  Tramadol Hcl 50 Mg Tabs (Tramadol hcl) .... Two times a day 10)  Cipro 250 Mg Tabs (Ciprofloxacin hcl) .Marland Kitchen.. 1 by mouth 2 times daily 11)  Levsin 0.125 Mg Tabs (Hyoscyamine sulfate) .Marland Kitchen.. 1 by mouth 2 times daily as needed for abdominal pain / cramping  Patient Instructions: 1)  f/u 3 months Prescriptions: LEVSIN 0.125 MG TABS (HYOSCYAMINE SULFATE) 1 by mouth 2 times daily as needed for abdominal pain / cramping  #60 x 1   Entered and Authorized by:   Hannah Beat MD   Signed by:   Hannah Beat MD on 08/02/2009   Method used:   Electronically to        [facility]* (retail)       [address]       [place], [place]  [address]       Ph: [phone]       Fax: [phone]   RxID:   [id number] CIPRO 250 MG TABS (CIPROFLOXACIN HCL) 1 by mouth 2 times daily  #20 x 0   Entered and Authorized by:   Hannah Beat MD   Signed by:   Hannah Beat  MD on 08/02/2009   Method used:   Electronically to        [facility]* (retail)       [address]       [place], [place]  [address]       Ph: [phone]       Fax: [phone]   RxID:   [id number]   Current Allergies (reviewed today): ! Eugenie Birks [facility]   Laboratory Results   Urine Tests  Date/Time Received: Aug 02, 2009 9:10 AM  Date/Time Reported: Aug 02, 2009 9:10 AM   Routine Urinalysis   Color: yellow Appearance: Cloudy Glucose: negative   (Normal Range: Negative) Bilirubin: negative   (Normal Range: Negative) Ketone: negative   (Normal Range: Negative) Spec. Gravity: 1.015   (Normal Range: 1.003-1.035) Blood: small   (Normal Range: Negative) pH: 5.0   (  Normal Range: 5.0-8.0) Protein: negative   (Normal Range: Negative) Urobilinogen: 0.2   (Normal Range: 0-1) Nitrite: negative   (Normal Range: Negative) Leukocyte Esterace: large   (Normal Range: Negative)

## 2010-04-17 NOTE — Assessment & Plan Note (Signed)
Summary: FOLLOW UP / LFW   Vital Signs:  Patient profile:   55 year old female Height:      64 inches Weight:      190.6 pounds BMI:     32.83 Temp:     98.5 degrees F oral Pulse rate:   76 / minute Pulse rhythm:   regular BP sitting:   120 / 78  (left arm) Cuff size:   large  Vitals Entered By: Benny Lennert CMA Duncan Dull) (September 25, 2009 3:11 PM)  History of Present Illness: Chief complaint 3 month follow up (Patient has stopped taken most of her medication)  Back pain: improved somewhat, but she did have to stop her oral prednisone dosing.  BP stable:tolerating her beta blocker  The patient returns again with multiple diffuse complaints. No history of fibromyalgia, and she has had extensive workups by Dr. Corliss Skains and Dr. Gavin Potters, the do not believe that she has any rheumatological disorder aside from fibromyalgia.  She is also suffering from chronic fatigue, tiredness, waking, pain in multiple areas throughout the body  chronic gatuge and brain fog. Still waking up and and still feeling exhasted. Can sleep ten hours and feeling ehhasted.   headaches for a long time tingling  hands, face.  charlie horse sensation and spasms with her hands  all this is compounded by her stopping multiple medications recently including stopping her gabapentin, Klonopin, Zoloft, Flexeril, prednisone, and everything else she was on with the exception of what is listed below.  She additionally is having some sweats and some nausea since.    Allergies: 1)  ! * Lexiscan 2)  * Savella  Past History:  Past medical, surgical, family and social histories (including risk factors) reviewed, and no changes noted (except as noted below).  Past Medical History: Reviewed history from 08/16/2009 and no changes required. OA - hips, back IBS HTN Fibromyalgia h/o Depression  h/o excessive ETOH use, cut back now, no rehab Suicidal gesture, > 20 years ago Hyperlipidemia Chronic small vessel  ischemic changes, pons (small T2 change), Neuro felt not c/w MS Tobacco abuse  CONTROLLED SUBSTANCE CONTRACT, DR. Patsy Lager, 08/16/2009  Rheum = Dr. Christell Faith Neurology  Past Surgical History: Reviewed history from 08/25/2009 and no changes required. CTS, 2007 R Gall Bladder, Dr. Clydie Braun Colonoscopy, 2008, polyps, repeat q5 years[place]   Family History: Reviewed history from 04/07/2009 and no changes required. Mother - 70, d/c ischemic bowel, HTN, back pain, uterine CA, borderline DM Father, alcoholic, CAD, MI Family History of Alcoholism/Addiction Family History of Colon CA 1st degree relative <60, Aunt - 65's  Sister, CAD, first MI at age 49, 3 cardiac procedures, Chrohn's Dz Sister - thyroid dysfuction  DM, multiple  Social History: Reviewed history from 04/07/2009 and no changes required. December moved from [facility] Married, [place] 2 children - 31, 34 (step -40) Current Smoker, 70 pack years (down to 1/2 ppd) Alcohol use-yes, 1-2 glasses of wine every two weeks Drug use-no Regular exercise-yes, 30 min, twice a week  Review of Systems      See HPI  Physical Exam  General:  Well-developed,well-nourished,in no acute distress; alert,appropriate and cooperative throughout examination Head:  Normocephalic and atraumatic without obvious abnormalities. No apparent alopecia or balding. Ears:  no external deformities.   Nose:  no external deformity.   Neck:  No deformities, masses, or tenderness noted. Lungs:  Normal respiratory effort, chest expands symmetrically. Lungs are clear to auscultation, no crackles or wheezes. Heart:  Normal rate and regular  rhythm. S1 and S2 normal without gallop, murmur, click, rub or other extra sounds. Psych:  Cognition and judgment appear intact. Alert and cooperative with normal attention span and concentration. No apparent delusions, illusions, hallucinations   Impression & Recommendations:  Problem # 1:  FIBROMYALGIA  (ICD-729.1) Assessment Deteriorated >25 minutes spent in face to face time with patient, >50% spent in counselling or coordination of care  this is an half been a very difficult case. Nothing to we have tried itself the patient, in virtually all noted therapies and medications have been tried in this case for fibromyalgia, the patient still is doing poorly, with diffuse pain and problems.  She reasonably had the idea that she would stop all of her medications. The only concern that I have is that she did get suicidal on the past which he stopped Cymbalta, and I strongly cautioned her to be ever vigilant in watching her self or any worsening of her depression. I don't think that you can say anything about the multiple symptoms that she is having now given the likely side effects and she is having from stopping all these medications. I would reassess this in about a month before any comment could be made.  The following medications were removed from the medication list:    Vicodin 5-500 Mg Tabs (Hydrocodone-acetaminophen) ..... One tab by mouth two times a day    Flexeril 10 Mg Tabs (Cyclobenzaprine hcl) .Marland Kitchen... 1 tab by mouth three times a day as needed for back pain.  Problem # 2:  MRI, BRAIN, ABNORMAL (ICD-794.09) patient did have an MRI in the past that showed 2 potential demyelinating lesions, with potential MS versus age-related changes. At that time, she did go to see [facility] neurology, neck position felt that the patient did not have MS and this was all age-related white substance changes. Patient still has a very high level of concern that this could be MS, and frankly I cannot say it is not. Patient has had a long history of the couple years of symptoms and then some relating of those symptoms, and it is a reasonable question. She requests a second opinion from a different neurological group and I think that that is very appropriate.  Orders: Neurology Referral (Neuro)  Problem # 3:  UTI  (ICD-599.0) some dysuria will check urine culture.  The following medications were removed from the medication list:    Sulfamethoxazole-tmp Ds 800-160 Mg Tabs (Sulfamethoxazole-trimethoprim) .Marland Kitchen... 1 by mouth two times a day  Orders: T-Culture, Urine ([id number]) Specimen Handling ([id number]) UA Dipstick w/o Micro (manual) ([id number])  Complete Medication List: 1)  Voltaren 1 % Gel (Diclofenac sodium) .... Apply as directed 4 times daily as needed 2)  Carvedilol 3.125 Mg Tabs (Carvedilol) .Marland Kitchen.. 1 by mouth two times a day 3)  Spiriva Handihaler 18 Mcg Caps (Tiotropium bromide monohydrate) .Marland Kitchen.. 1 inh  daily 4)  Centrum Silver Ultra Womens Tabs (Multiple vitamins-minerals) .... Once daily 5)  Phillips Colon Health Caps (Probiotic product) .... Once daily 6)  Vitamin D3 2000iu  .... Once daily 7)  Prevacid 30 Mg Cpdr (Lansoprazole) .Marland Kitchen.. 1 capsule twice a day 30 minutes before meals  Patient Instructions: 1)  Referral Appointment Information 2)  Day/Date: 3)  Time: 4)  Place/MD: 5)  Address: 6)  Phone/Fax: 7)  Patient given appointment information. Information/Orders faxed/mailed.   Current Allergies (reviewed today): ! Eugenie Birks [facility]  Laboratory Results   Urine Tests  Date/Time Received: September 25, 2009 3:16 PM  Date/Time  Reported: September 25, 2009 3:16 PM   Routine Urinalysis   Color: yellow Appearance: Clear Glucose: negative   (Normal Range: Negative) Bilirubin: negative   (Normal Range: Negative) Ketone: negative   (Normal Range: Negative) Spec. 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  Rindi Beechy MD  October 02, 2009 6:00 PM   Clinical Lists Changes  Orders: Added new Referral order of Radiology Referral (Radiology) - Signed

## 2010-04-17 NOTE — Assessment & Plan Note (Signed)
Summary: 3 m f/u dlo   Vital Signs:  Patient profile:   55 year old female Height:      64 inches Weight:      184.2 pounds BMI:     31.73 Temp:     98.6 degrees F oral Pulse rate:   76 / minute Pulse rhythm:   regular BP sitting:   120 / 78  (left arm) Cuff size:   large  Vitals Entered By: Benny Lennert CMA Duncan Dull) (November 06, 2009 12:07 PM)  History of Present Illness: Chief complaint 3 month follow up   BP, stable and compliant with all medications  Lipids, stable and compliant with her Lipitor, causes no difficulty.  neck / back: has an upcoming appointment with Dr. Danielle Dess see my prior telephone note, reviewed the lumbar spine and cervical spine MRIs with the patient in detail. She does have diffuse disc disease, with multiple herniations at many levels.  Memory, neurologist recommended formal neuropsychiatric evaluation for memory evaluation. she failed the initial screening test for memory dysfunction.  Headaches and squeezing sensation. Wil last about forty five minutes or so.   sometimes it'll last only 30 seconds to a minute, and sometimes she will also have some sweating sensations at the same time. She has had 2 MRIs which are reassuring, of the brain, as well as cervical and lumbar spine MRIs, and she is consulted with 2 different neurologists.  Insomnia, continues to sleep only 2-3 hours a night.  Fibromyalgia: Poorly controlled. Failure all forms of treatment thus far. She is unable to exercise at all. Sleeping poorly.  Allergies: 1)  ! * Lexiscan 2)  * Savella  Past History:  Past medical, surgical, family and social histories (including risk factors) reviewed, and no changes noted (except as noted below).  Past Medical History: OA - hips, back IBS HTN Fibromyalgia h/o Depression  h/o excessive ETOH use, cut back now, no rehab Suicidal gesture, > 20 years ago Hyperlipidemia Chronic small vessel ischemic changes, pons (small T2 change), Neuro felt  not c/w MS Tobacco abuse  Rheum = Dr. Christell Faith [facility]  Past Surgical History: Reviewed history from 08/25/2009 and no changes required. CTS, 2007 R Gall Bladder, Dr. Clydie Braun Colonoscopy, 2008, polyps, [place] q5 years[place]   Family History: Reviewed history from 04/07/2009 and no changes required. Mother - 62, d/c ischemic bowel, HTN, back pain, uterine CA, borderline DM Father, alcoholic, CAD, MI Family History of Alcoholism/Addiction Family History of Colon CA 1st degree relative <60, Aunt - 43's  Sister, CAD, first MI at age 75, 3 cardiac procedures, Chrohn's Dz Sister - thyroid dysfuction  DM, multiple  Social History: Reviewed history from 04/07/2009 and no changes required. December moved from [facility] Married, [place] 2 children - 31, 34 (step -40) Current Smoker, 70 pack years (down to 1/2 ppd) Alcohol use-yes, 1-2 glasses of wine every two weeks Drug use-no Regular exercise-yes, 30 min, twice a week  Review of Systems       The patient complains of weight loss.  The patient denies chest pain, syncope, and abdominal pain.         weight loss was intentional. All of the complaints are as above. Fibromyalgia and pain complaints continue. Diffusely, as well as radiculopathy in all extremities.  Physical Exam  Additional Exam:  GEN: WDWN, NAD, Non-toxic, A & O x 3 HEENT: Atraumatic, Normocephalic. Neck supple. No masses, No LAD. Ears and Nose: No external deformity. CV: RRR, No M/G/R. No JVD. No thrill.  No extra heart sounds. PULM: CTA B, no wheezes, crackles, rhonchi. No retractions. No resp. distress. No accessory muscle use. ABD: S, NT, ND, +BS. No rebound tenderness. No HSM.  EXTR: No c/c/e NEURO: Normal gait.  PSYCH: Normally interactive. Conversant. Not depressed or anxious appearing.  Calm demeanor.     Impression & Recommendations:  Problem # 1:  MEMORY LOSS (ICD-780.93) neuropsychological evaluation for detailed memory evaluation, given  neurological recommendations. If this is abnormal, we will need to help assist to have her followup with another neurologist group or with a different neurologist at [facility]. I would not refer her again to [facility].  Orders: Misc. Referral (Misc. Ref)  Problem # 2:  BACK PAIN, LUMBAR, WITH RADICULOPATHY (ICD-724.4) followup, [facility]  Problem # 3:  SLEEP DISORDER (ICD-780.50) trial of trazodone  Problem # 4:  DYSURIA (ICD-788.1)  chronic dysuria and urgency, failure to have a positive urine culture. Failure of antibiotic treatment.  I do not think that this is an acute infection, patient may have interstitial cystitis, or other. We discussed urological evaluation, but did not make this appointment prior to her leaving the office. I'm going to make that referral at this time.  Orders: Urology Referral (Urology)  Problem # 5:  HYPERLIPIDEMIA (ICD-272.4)  Her updated medication list for this problem includes:    Lipitor 40 Mg Tabs (Atorvastatin calcium) ..... One tablet daily  Problem # 6:  FIBROMYALGIA (ICD-729.1) poorly controlled, highly encouraged patient to seek out a pool, and joined the [facility]  Complete Medication List: 1)  Voltaren 1 % Gel (Diclofenac sodium) .... Apply as directed 4 times daily as needed (3 month supply) 2)  Carvedilol 3.125 Mg Tabs (Carvedilol) .Marland Kitchen.. 1 by mouth two times a day 3)  Spiriva Handihaler 18 Mcg Caps (Tiotropium bromide monohydrate) .Marland Kitchen.. 1 inh  daily 4)  Centrum Silver Ultra Womens Tabs (Multiple vitamins-minerals) .... Once daily 5)  Phillips Colon Health Caps (Probiotic product) .... Once daily 6)  Vitamin D3 2000iu  .... Once daily 7)  Prevacid 30 Mg Cpdr (Lansoprazole) .Marland Kitchen.. 1 capsule twice a day 30 minutes before meals 8)  Lipitor 40 Mg Tabs (Atorvastatin calcium) .... One tablet daily 9)  Trazodone Hcl 100 Mg Tabs (Trazodone hcl) .Marland Kitchen.. 1 by mouth at bedtime  Patient Instructions: 1)  Referral Appointment  Information 2)  Day/Date: 3)  Time: 4)  Place/MD: 5)  Address: 6)  Phone/Fax: 7)  Patient given appointment information. Information/Orders faxed/mailed.  8)  f/u 3 months Prescriptions: LIPITOR 40 MG TABS (ATORVASTATIN CALCIUM) one tablet daily  #90 x 3   Entered and Authorized by:   Hannah Beat MD   Signed by:   Hannah Beat MD on 11/06/2009   Method used:   Print then Give to Patient   RxID:   [id number] PREVACID 30 MG  CPDR (LANSOPRAZOLE) 1 capsule twice a day 30 minutes before meals  #180 x 3   Entered and Authorized by:   Hannah Beat MD   Signed by:   Hannah Beat MD on 11/06/2009   Method used:   Print then Give to Patient   RxID:   [id number] CARVEDILOL 3.125 MG TABS (CARVEDILOL) 1 by mouth two times a day  #180 x 3   Entered and Authorized by:   Hannah Beat MD   Signed by:   Hannah Beat MD on 11/06/2009   Method used:   Print then Give to Patient   RxID:   [id number] VOLTAREN 1 % GEL (  DICLOFENAC SODIUM) Apply as directed 4 times daily as needed (3 month supply)  #15 x 3   Entered and Authorized by:   Hannah Beat MD   Signed by:   Hannah Beat MD on 11/06/2009   Method used:   Print then Give to Patient   RxID:   [id number] TRAZODONE HCL 100 MG TABS (TRAZODONE HCL) 1 by mouth at bedtime  #90 x 3   Entered and Authorized by:   Hannah Beat MD   Signed by:   Hannah Beat MD on 11/06/2009   Method used:   Print then Give to Patient   RxID:   [id number] TRAZODONE HCL 100 MG TABS (TRAZODONE HCL) 1 by mouth at bedtime  #30 x 2   Entered and Authorized by:   Hannah Beat MD   Signed by:   Hannah Beat MD on 11/06/2009   Method used:   Electronically to        [facility]* (retail)       [address]       [place], [place]  [address]       Ph: [phone]       Fax: [phone]   RxID:   [id number]   Current Allergies (reviewed today): ! Eugenie Birks * SAVELLA  Appended Document: 3 m f/u  dlo  Past History:  Past Surgical History: CTS, 2007 R Gall Bladder, Dr. Clydie Braun Colonoscopy, 2008, polyps, [place]5 [place] (03/29/2005-hyperplastic polyp)    Clinical Lists Changes  Observations: Added new observation of PAST SURG HX: CTS, 2007 R Gall Bladder, Dr. Clydie Braun Colonoscopy, 2008, polyps, [place]5 years[place] (03/29/2005-hyperplastic polyp) (11/26/2009 10:16)

## 2010-04-17 NOTE — Progress Notes (Signed)
  Phone Note From Other Clinic Call back at [phone]   Caller: Dr Eula Flax Summary of Call: Received a call from Dr Eula Flax regarding Cecelia Byars. His message said that they have  scheduled her for an iniatial interview on 11/27/2009. After the interview he will fill out forms that go to her insurance co. and this testing will then have to be Pre-Authorized which he said would be a little tricky. If all gets approved he said she would have the testing in late October. This message was left on answering machine.  Initial call taken by: Carlton Adam,  November 15, 2009 8:52 AM  Follow-up for Phone Call        noted Hannah Beat MD  November 15, 2009 11:24 AM

## 2010-04-17 NOTE — Miscellaneous (Signed)
Summary: Controlled Substance Agreement  Controlled Substance Agreement   Imported By: Lanelle Bal 08/23/2009 08:08:44  _____________________________________________________________________  External Attachment:    Type:   Image     Comment:   External Document

## 2010-04-17 NOTE — Progress Notes (Signed)
Summary: pressure and nausea  Phone Note Call from Patient Call back at Home Phone [phone]   Caller: Patient Call For: Hannah Beat MD Summary of Call: Patient is still having alot of pressure and is still feeling very nauseated. She has 5 more days of cipro left. She says that she is tired of feelint this way. She wantst to know if there is something else that can be done. Uses [facility] if needed.  Initial call taken by: Melody Comas,  August 24, 2009 2:07 PM  Follow-up for Phone Call        Chronic nausea, dysphagia -- i referred to gi, but appt given in mid july  i suspect this may be gi in origin and thinks she likely needs endoscopy. can you see if someone from gi or one of their NP's could see sooner? Follow-up by: Hannah Beat MD,  August 24, 2009 2:52 PM  Additional Follow-up for Phone Call Additional follow up Details #1::        Appt made with Dr Lina Sar on 06/15/2011at 8:30am. Patient notified. Additional Follow-up by: Carlton Adam,  August 24, 2009 3:48 PM

## 2010-04-17 NOTE — Procedures (Signed)
Summary: SUMMARY REPORT  SUMMARY REPORT   Imported By: Mirna Mires 04/17/2009 10:19:01  _____________________________________________________________________  External Attachment:    Type:   Image     Comment:   External Document

## 2010-04-20 NOTE — Procedures (Signed)
Summary: [facility]  [facility]   Imported By: Sherian Rein 09/25/2009 11:31:40  _____________________________________________________________________  External Attachment:    Type:   Image     Comment:   External Document

## 2010-04-20 NOTE — Consult Note (Signed)
Summary: [facility]  [facility]   Imported By: Lanelle Bal 05/05/2009 11:50:04  _____________________________________________________________________  External Attachment:    Type:   Image     Comment:   External Document

## 2010-04-25 ENCOUNTER — Ambulatory Visit (INDEPENDENT_AMBULATORY_CARE_PROVIDER_SITE_OTHER): Admitting: Family Medicine

## 2010-04-25 ENCOUNTER — Encounter: Payer: Self-pay | Admitting: Family Medicine

## 2010-04-25 DIAGNOSIS — J019 Acute sinusitis, unspecified: Secondary | ICD-10-CM | POA: Insufficient documentation

## 2010-04-25 DIAGNOSIS — L989 Disorder of the skin and subcutaneous tissue, unspecified: Secondary | ICD-10-CM

## 2010-05-03 NOTE — Assessment & Plan Note (Signed)
Summary: COLD HANGING ON and BURNING SPOT ON BACK / LFW   Vital Signs:  Patient profile:   55 year old female Height:      64 inches Weight:      170.50 pounds BMI:     29.37 Temp:     98.1 degrees F oral Pulse rate:   76 / minute Pulse rhythm:   regular BP sitting:   120 / 80  (left arm) Cuff size:   regular  Vitals Entered By: Benny Lennert CMA Duncan Dull) (April 25, 2010 10:46 AM)  History of Present Illness: Chief complaint ? cold and Burning spot on back  55 year old female:  sinusitis: Headaches have been behind left eye.  Has had a persistent cold that has been ongoing now for a couple of months. Taken some coricidin hbp.  pain behind left eye "like a knife" some cough, rarely tinged with blood some ha no n/v/d.  no st now  gets hot and cold.   Dr. Danielle Dess, injection, really got much   Spot on left upper back, growing in size, now changed coloration.  Allergies: 1)  ! * Lexiscan 2)  * Savella  Past History:  Past medical, surgical, family and social histories (including risk factors) reviewed, and no changes noted (except as noted below).  Past Medical History: OA - hips, back IBS HTN Fibromyalgia h/o Depression  Suicidal gesture, > 20 years ago Hyperlipidemia Chronic small vessel ischemic changes, pons (small T2 change), Neuro felt not c/w MS Tobacco abuse  Rheum = Dr. Christell Faith [facility]  Past Surgical History: Reviewed history from 11/26/2009 and no changes required. CTS, 2007 R Gall Bladder, Dr. Clydie Braun Colonoscopy, 2008, polyps, [place]5 [place] (03[place]29/2005-hyperplastic polyp)  Family History: Reviewed history from 04/07/2009 and no changes required. Mother - 31, d/c ischemic bowel, HTN, back pain, uterine CA, borderline DM Father, alcoholic, CAD, MI Family History of Alcoholism/Addiction Family History of Colon CA 1st degree relative <60, Aunt - 46's  Sister, CAD, first MI at age 40, 3 cardiac procedures, Chrohn's  Dz Sister - thyroid dysfuction  DM, multiple  Social History: Reviewed history from 04/07/2009 and no changes required. December moved from [facility] [place] Married, [place] 2 children - 31, 34 (step -40) Current Smoker, 70 pack years (down to 1/2 ppd) Alcohol use-yes, 1-2 glasses of wine every two weeks Drug use-no Regular exercise-yes, 30 min, twice a week  Physical Exam  General:  Well-developed,well-nourished,in no acute distress; alert,appropriate and cooperative throughout examination Head:  Normocephalic and atraumatic without obvious abnormalities. No apparent alopecia or balding.  TTP L frontal sinus Ears:  External ear exam shows no significant lesions or deformities.  Otoscopic examination reveals clear canals, tympanic membranes are intact bilaterally without bulging, retraction, inflammation or discharge. Hearing is grossly normal bilaterally. Nose:  External nasal examination shows no deformity or inflammation. Nasal mucosa are pink and moist without lesions or exudates. Mouth:  Oral mucosa and oropharynx without lesions or exudates.  Teeth in good repair. Neck:  No deformities, masses, or tenderness noted. Lungs:  Normal respiratory effort, chest expands symmetrically. Lungs are clear to auscultation, no crackles or wheezes. Heart:  Normal rate and regular rhythm. S1 and S2 normal without gallop, murmur, click, rub or other extra sounds. Abdomen:  Bowel sounds positive,abdomen soft and non-tender without masses, organomegaly or hernias noted. Neurologic:  alert & oriented X3 and gait normal.   Cervical Nodes:  No lymphadenopathy noted Psych:  Cognition and judgment appear intact. Alert and cooperative with  normal attention span and concentration. No apparent delusions, illusions, hallucinations   Impression & Recommendations:  Problem # 1:  SINUSITIS - ACUTE-NOS (ICD-461.9) Assessment New  Her updated medication list for this problem includes:    Amoxicillin 500 Mg Tabs  (Amoxicillin) .Marland KitchenMarland KitchenMarland KitchenMarland Kitchen 3 tabs by mouth two times a day  Instructed on treatment. Call if symptoms persist or worsen.   Problem # 2:  SKIN LESION (ICD-709.9) enlarging, changing colors, - concern for potential skin ca, consult derm.  Orders: Dermatology Referral (Derma)  Complete Medication List: 1)  Voltaren 1 % Gel (Diclofenac sodium) .... Apply as directed 4 times daily as needed (3 month supply) 2)  Carvedilol 3.125 Mg Tabs (Carvedilol) .Marland Kitchen.. 1 by mouth two times a day 3)  Spiriva Handihaler 18 Mcg Caps (Tiotropium bromide monohydrate) .Marland Kitchen.. 1 inh  daily 4)  Centrum Silver Ultra Womens Tabs (Multiple vitamins-minerals) .... Once daily 5)  Phillips Colon Health Caps (Probiotic product) .... Once daily 6)  Vitamin D3 2000iu  .... Once daily 7)  Prevacid 30 Mg Cpdr (Lansoprazole) .Marland Kitchen.. 1 capsule twice a day 30 minutes before meals 8)  Lipitor 40 Mg Tabs (Atorvastatin calcium) .... One tablet daily 9)  Amoxicillin 500 Mg Tabs (Amoxicillin) .... 3 tabs by mouth two times a day  Patient Instructions: 1)  Referral Appointment Information 2)  Day/Date: 3)  Time: 4)  Place/MD: 5)  Address: 6)  Phone/Fax: 7)  Patient given appointment information. Information/Orders faxed/mailed.  8)  OK to try: 9)  Benadryl, 2 tablets at night 10)  or Dramamine 2 tablets at night 11)  or Unisom, 1 tablet at night Prescriptions: SPIRIVA HANDIHALER 18 MCG CAPS (TIOTROPIUM BROMIDE MONOHYDRATE) 1 inh  daily  #3 x 3   Entered and Authorized by:   Hannah Beat MD   Signed by:   Hannah Beat MD on 04/25/2010   Method used:   Print then Give to Patient   RxID:   [id number] AMOXICILLIN 500 MG TABS (AMOXICILLIN) 3 tabs by mouth two times a day  #60 x 0   Entered and Authorized by:   Hannah Beat MD   Signed by:   Hannah Beat MD on 04/25/2010   Method used:   Print then Give to Patient   RxID:   [id number] SPIRIVA HANDIHALER 18 MCG CAPS (TIOTROPIUM BROMIDE MONOHYDRATE) 1 inh  daily  #1 x  11   Entered by:   Benny Lennert CMA (AAMA)   Authorized by:   Hannah Beat MD   Signed by:   Benny Lennert CMA (AAMA) on 04/25/2010   Method used:   Print then Give to Patient   RxID:   [id number]    Orders Added: 1)  Dermatology Referral [Derma] 2)  Est. Patient Level IV [73220]    Current Allergies (reviewed today): ! Eugenie Birks [facility]

## 2010-05-22 ENCOUNTER — Encounter: Payer: Self-pay | Admitting: Family Medicine

## 2010-06-05 ENCOUNTER — Telehealth: Payer: Self-pay | Admitting: Internal Medicine

## 2010-06-05 NOTE — Consult Note (Signed)
Summary: [facility]  [facility]   Imported By: Lanelle Bal 05/31/2010 13:12:19  _____________________________________________________________________  External Attachment:    Type:   Image     Comment:   External Document

## 2010-06-06 NOTE — Telephone Encounter (Signed)
Patient is scheduled on 06/08/10 at 8:30 AM with Mike Gip, PA. Patient aware.

## 2010-06-08 ENCOUNTER — Encounter: Payer: Self-pay | Admitting: Physician Assistant

## 2010-06-08 ENCOUNTER — Other Ambulatory Visit (INDEPENDENT_AMBULATORY_CARE_PROVIDER_SITE_OTHER)

## 2010-06-08 ENCOUNTER — Telehealth: Payer: Self-pay | Admitting: *Deleted

## 2010-06-08 ENCOUNTER — Ambulatory Visit (INDEPENDENT_AMBULATORY_CARE_PROVIDER_SITE_OTHER): Admitting: Physician Assistant

## 2010-06-08 VITALS — BP 124/72 | HR 88 | Ht 64.0 in | Wt 166.0 lb

## 2010-06-08 DIAGNOSIS — Z800 Family history of malignant neoplasm of digestive organs: Secondary | ICD-10-CM

## 2010-06-08 DIAGNOSIS — R195 Other fecal abnormalities: Secondary | ICD-10-CM

## 2010-06-08 DIAGNOSIS — Z860100 Personal history of colon polyps, unspecified: Secondary | ICD-10-CM

## 2010-06-08 LAB — CBC WITH DIFFERENTIAL/PLATELET
Eosinophils Absolute: 0.1 10*3/uL (ref 0.0–0.7)
Eosinophils Relative: 1.1 % (ref 0.0–5.0)
HCT: 40.7 % (ref 36.0–46.0)
Lymphs Abs: 3 10*3/uL (ref 0.7–4.0)
MCHC: 35.2 g/dL (ref 30.0–36.0)
MCV: 95 fl (ref 78.0–100.0)
Monocytes Absolute: 0.6 10*3/uL (ref 0.1–1.0)
Platelets: 252 10*3/uL (ref 150.0–400.0)
WBC: 9 10*3/uL (ref 4.5–10.5)

## 2010-06-08 MED ORDER — PEG-KCL-NACL-NASULF-NA ASC-C 100 G PO SOLR: 1.0000 | Freq: Once | ORAL | Status: AC

## 2010-06-08 NOTE — Patient Instructions (Signed)
Please go to the basement level to have your labs drawn.   Colonoscopy scheduled with Dr. Lina Sar. Directions and brochure provided. We have printed the prescription for you to take to your pharmacy.

## 2010-06-08 NOTE — Progress Notes (Signed)
  Subjective:    Patient ID: Lindsay Morse, female    DOB: 1955/06/09, 55 y.o.   MRN: [id number]  HPI Bruit is a pleasant 55 year old white female known to Dr. Felicity Pellegrini , last seen in June of 2011 at which time she had upper endoscopy for complaints of nausea reflux and dysphasia. She was found to have a small sessile polyp in the esophagus normal GE junction and evidence of duodenitis. She had an empiric Maloney dilation. Biopsy from the esophagus showed benign squamous tissue. Patient had colonoscopy in 2007  , in [place]. That report shows 2 small polyps and a tortuous sigmoid colon , the polyps were hyperplastic.   Patient had recent physical through her gynecologist and if Hemoccults which returned positive. She currently has no GI symptoms , she does stay on Prevacid which controls her reflux well and has no current complaints of dysphagia. She has no complaint of abdominal pain has not noticed any changes in her bowel habits nor any melena or hematochezia. She has been on a diet supplement called "Infinity" and has lost about 35 pounds over the past 4 months. This is an herbal preparation which includes fiber and she says that it's actually making her very regular. She does mention some mild right flank discomfort over the past one month which is not affected by by mouth intake, she has no urinary symptoms and had a recent negative UA.   Patient does have positive family history of colon cancer in a.m. And her grandfather as well as family history of polyps.    Review of Systems  Constitutional: Negative.   HENT: Negative.   Eyes: Negative.   Gastrointestinal: Negative.   Genitourinary: Negative.   Musculoskeletal: Positive for myalgias.  Neurological: Negative.   Hematological: Negative.   Psychiatric/Behavioral: Negative.        Objective:   Physical Exam  Well-developed white female in no acute distress , pleasant. Alert   and oriented x3 HEENT; nontraumatic normocephalic EOMI  PERRLA sclera anicteric Neck supple; no JVD  Cardiovascular; regular rate and rhythm with S1-S2 no murmur rub or gallop Pulmonary clear Abdomen soft basically nontender no palpable mass or hepatosplenomegaly bowel sounds active Rectal exam; not done recent Hemoccult-positive  Extremities; no clubbing cyanosis or edema Skin; benign without obvious lesion Psych;mood and affect  Normal and appropriate.       Assessment & Plan:   #51 55 year old female with Hemoccult positive stool , asymptomatic from a GI standpoint. Patient does have family history of colon cancer and a personal history of hyperplastic polyps.  She also had duodenitis on endoscopy  June 2011.  she could have persistent duodenitis causing heme positive stool, that is being treated with Prevacid and she is   not on any gastric irritants. Need to rule out recurrent polyps or occult colon neoplasm.   Plan;  schedule for colonoscopy with Dr. Juanda Chance, the procedure was discussed in detail with the patient and she is agreeable   check CBC today    #2 Chronic GERD   continue Prevacid 30 mg daily    #3 Family history of colon cancer   patient will need screening colonoscopy every  5 years.    #4 Personal history of hyperplastic polyps   see plan as above

## 2010-06-08 NOTE — Telephone Encounter (Signed)
Message copied by Jesse Fall on Fri Jun 08, 2010  3:18 PM ------      Message from: [place], [place]      Created: Fri Jun 08, 2010  3:03 PM       PLEASE LET Lindsay Morse KNOW HER HGB IS NORMAL AT 14.3 WHICH IS GOOD

## 2010-06-08 NOTE — Progress Notes (Signed)
I agree

## 2010-06-08 NOTE — Telephone Encounter (Signed)
Patient notified of lab results as per Amy Esterwood, PA 

## 2010-06-11 ENCOUNTER — Encounter: Payer: Self-pay | Admitting: *Deleted

## 2010-06-11 ENCOUNTER — Telehealth: Payer: Self-pay | Admitting: *Deleted

## 2010-06-11 NOTE — Telephone Encounter (Signed)
Message copied by Jesse Fall on Mon Jun 11, 2010  4:54 PM ------      Message from: [place], [place]      Created: Mon Jun 11, 2010  4:33 PM       Call pt with nrmal results-hgb 14.3

## 2010-06-11 NOTE — Telephone Encounter (Signed)
Patient notified of lab results as per Amy Esterwood, PA 

## 2010-06-12 ENCOUNTER — Ambulatory Visit (AMBULATORY_SURGERY_CENTER): Admitting: Internal Medicine

## 2010-06-12 ENCOUNTER — Encounter: Payer: Self-pay | Admitting: Internal Medicine

## 2010-06-12 VITALS — BP 148/85 | HR 76 | Temp 96.6°F | Resp 27 | Ht 64.0 in | Wt 165.0 lb

## 2010-06-12 DIAGNOSIS — Z86010 Personal history of colonic polyps: Secondary | ICD-10-CM

## 2010-06-12 DIAGNOSIS — Z1211 Encounter for screening for malignant neoplasm of colon: Secondary | ICD-10-CM

## 2010-06-12 DIAGNOSIS — R195 Other fecal abnormalities: Secondary | ICD-10-CM

## 2010-06-12 DIAGNOSIS — K921 Melena: Secondary | ICD-10-CM

## 2010-06-12 DIAGNOSIS — D126 Benign neoplasm of colon, unspecified: Secondary | ICD-10-CM

## 2010-06-12 LAB — HM COLONOSCOPY

## 2010-06-12 NOTE — Patient Instructions (Signed)
Multiple polyps, otherwise normal exam. Await pathology results.   No specific reason for heme positive stool. Recall colonoscopy in 10 years.

## 2010-06-13 ENCOUNTER — Telehealth: Payer: Self-pay

## 2010-06-13 NOTE — Telephone Encounter (Signed)

## 2010-06-18 ENCOUNTER — Ambulatory Visit: Admitting: Internal Medicine

## 2010-06-19 NOTE — Procedures (Signed)
Summary: Colonoscopy  Patient: Lindsay Morse Note: All result statuses are Final unless otherwise noted.  Tests: (1) Colonoscopy (COL)   COL Colonoscopy           DONE     [facility]     [address]     [place], [place]  [address]          COLONOSCOPY PROCEDURE REPORT          PATIENT:  Lindsay Morse, Lindsay Morse  MR#:  [id number]     BIRTHDATE:  Feb 10, 1956, 54 yrs. old  GENDER:  female     ENDOSCOPIST:  Hedwig Morton. Juanda Chance, MD     REF. BY:  Karleen Hampshire T. Copland, M.D.     PROCEDURE DATE:  06/12/2010     PROCEDURE:  Colonoscopy 29562     ASA CLASS:  Class II     INDICATIONS:  history of hyperplastic polyps, heme positive stool     2007 hyperplastic polyps     heme positive on home screening     MEDICATIONS:   Versed 12 mg, Fentanyl 100 mcg, Benadryl 25 mg          DESCRIPTION OF PROCEDURE:   After the risks benefits and     alternatives of the procedure were thoroughly explained, informed     consent was obtained.  Digital rectal exam was performed and     revealed no rectal masses.   The LB [id number]-[id number] endoscope     was introduced through the anus and advanced to the cecum, which     was identified by both the appendix and ileocecal valve, without     limitations.  The quality of the prep was good, using MoviPrep.     The instrument was then slowly withdrawn as the colon was fully     examined.     <<PROCEDUREIMAGES>>          FINDINGS:  There were multiple polyps identified and removed. 3 mm     polyp at 70cm, and at 20cm, 3 diminutive polyps at 3 cm The polyps     were removed using cold biopsy forceps (see image4, image6,     image5, and image7).  This was otherwise a normal examination of     the colon (see image5, image3, image2, and image1).   Retroflexed     views in the rectum revealed no abnormalities.    The scope was     then withdrawn from the patient and the procedure completed.          COMPLICATIONS:  None     ENDOSCOPIC IMPRESSION:     1) Polyps,  multiple     2) Otherwise normal examination     difficult exam, tortuous colon, suggest Propofol in the future     RECOMMENDATIONS:     1) Await pathology results     Propofol anesthesia for future recalls     no specific reason for heme positive stool, suspect anorectal     source     REPEAT EXAM:  In 10 year(s) for.          ______________________________     Hedwig Morton. Juanda Chance, MD          CC:          n.     eSIGNED:   Hedwig Morton. Raksha Wolfgang at 06/12/2010 04:06 PM          Cecelia Byars, [id number]  Note: An exclamation mark Marland Kitchen)  indicates a result that was not dispersed into the flowsheet. Document Creation Date: 06/12/2010 4:06 PM _______________________________________________________________________  (1) Order result status: Final Collection or observation date-time: 06/12/2010 15:53 Requested date-time:  Receipt date-time:  Reported date-time:  Referring Physician:   Ordering Physician: Lina Sar [phone]) Specimen Source:  Source: Launa Grill Order Number: [id number] Lab site:

## 2010-06-26 LAB — CBC
MCHC: 34.3 g/dL (ref 30.0–36.0)
MCV: 94.2 fL (ref 78.0–100.0)
Platelets: 224 10*3/uL (ref 150–400)

## 2010-06-26 LAB — BASIC METABOLIC PANEL
BUN: 20 mg/dL (ref 6–23)
CO2: 27 mEq/L (ref 19–32)
Chloride: 102 mEq/L (ref 96–112)
Creatinine, Ser: 0.8 mg/dL (ref 0.4–1.2)

## 2010-06-26 LAB — DIFFERENTIAL
Basophils Absolute: 0.1 10*3/uL (ref 0.0–0.1)
Basophils Relative: 1 % (ref 0–1)
Eosinophils Absolute: 0.3 10*3/uL (ref 0.0–0.7)
Monocytes Absolute: 0.5 10*3/uL (ref 0.1–1.0)
Monocytes Relative: 6 % (ref 3–12)
Neutro Abs: 5.4 10*3/uL (ref 1.7–7.7)
Neutrophils Relative %: 56 % (ref 43–77)

## 2010-06-26 LAB — ETHANOL: Alcohol, Ethyl (B): <5 mg/dL (ref 0–10)

## 2010-07-05 ENCOUNTER — Encounter: Payer: Self-pay | Admitting: Family Medicine

## 2010-07-06 ENCOUNTER — Encounter: Payer: Self-pay | Admitting: Family Medicine

## 2010-07-06 ENCOUNTER — Ambulatory Visit (INDEPENDENT_AMBULATORY_CARE_PROVIDER_SITE_OTHER): Admitting: Family Medicine

## 2010-07-06 VITALS — BP 118/80 | HR 84 | Temp 98.2°F | Ht 64.0 in | Wt 165.0 lb

## 2010-07-06 DIAGNOSIS — J40 Bronchitis, not specified as acute or chronic: Secondary | ICD-10-CM

## 2010-07-06 DIAGNOSIS — F17200 Nicotine dependence, unspecified, uncomplicated: Secondary | ICD-10-CM

## 2010-07-06 MED ORDER — DOXYCYCLINE HYCLATE 100 MG PO CAPS: 100.0000 mg | ORAL_CAPSULE | Freq: Two times a day (BID) | ORAL | Status: AC

## 2010-07-06 MED ORDER — DOXYCYCLINE HYCLATE 100 MG PO CAPS: 100.0000 mg | ORAL_CAPSULE | Freq: Two times a day (BID) | ORAL | Status: DC

## 2010-07-06 MED ORDER — PREDNISONE 50 MG PO TABS: 50.0000 mg | ORAL_TABLET | Freq: Every day | ORAL | Status: AC

## 2010-07-06 NOTE — Assessment & Plan Note (Signed)
Expect COPD flare - meets criteria.   Start prednisone and doxy to cover. Update if worsening.

## 2010-07-06 NOTE — Patient Instructions (Signed)
I think you have COPD flare. Steroid course for 7 days and antibiotics for 10 days. If not better, return to be seen. Good job with cutting back on smoking, will give you some more resources and set you up today with coach.

## 2010-07-06 NOTE — Assessment & Plan Note (Signed)
Discussed cessation, congratulated as so far.  [place] [email or web address] and pt filled out form to get set up with resources/coach as she is interested today.

## 2010-07-06 NOTE — Progress Notes (Signed)
  Subjective:    Patient ID: Lindsay Morse, female    DOB: 1955/09/05, 55 y.o.   MRN: [id number]  HPI CC: bronchitis?  5d h/o ST, chest burning, coughing up green mucous.  More productive sputum, more cough, more SOB than normal.  Low grade fever.  Using mucinex and tussionex.  No abd pain/n/v/d, rashes, body aches other than normal.  No ear pain or tooth pain.  No sinus pressure.  Smoking 6 cig/day (from 2 ppd).  Trying to cut out.  Husband has been sick as well.  Had MI last week.  Interested in quit[facility].  Told mild COPD.  Review of Systems Per HPI    Objective:   Physical Exam  Vitals reviewed. Constitutional: She appears well-developed and well-nourished. No distress.  HENT:  Head: Normocephalic and atraumatic.  Right Ear: External ear normal.  Left Ear: External ear normal.  Nose: No mucosal edema or rhinorrhea. Right sinus exhibits no maxillary sinus tenderness and no frontal sinus tenderness. Left sinus exhibits no maxillary sinus tenderness and no frontal sinus tenderness.  Mouth/Throat: Uvula is midline, oropharynx is clear and moist and mucous membranes are normal. No oropharyngeal exudate.  Eyes: Conjunctivae and EOM are normal. Pupils are equal, round, and reactive to light. No scleral icterus.  Neck: Normal range of motion. Neck supple. No JVD present. No thyromegaly present.  Cardiovascular: Normal rate, regular rhythm, normal heart sounds and intact distal pulses.   No murmur heard. Pulmonary/Chest: Effort normal. No respiratory distress. She has wheezes (expiratory). She has no rales.       Coarse breath sounds, wheezing with forced expiration and starts coughing  Lymphadenopathy:    She has no cervical adenopathy.  Skin: Skin is warm and dry. No rash noted.          Assessment & Plan:

## 2010-07-19 ENCOUNTER — Telehealth: Payer: Self-pay | Admitting: Internal Medicine

## 2010-07-19 NOTE — Telephone Encounter (Signed)
Read patient the letter mailed to her address. Patient did not receive the letter. Mailed another letter to her.

## 2010-07-24 ENCOUNTER — Telehealth: Payer: Self-pay | Admitting: Internal Medicine

## 2010-07-24 NOTE — Telephone Encounter (Signed)
Patient requested a copy of path report. Copy mailed to patient.

## 2010-07-30 ENCOUNTER — Ambulatory Visit (INDEPENDENT_AMBULATORY_CARE_PROVIDER_SITE_OTHER): Admitting: Family Medicine

## 2010-07-30 ENCOUNTER — Ambulatory Visit: Admitting: Family Medicine

## 2010-07-30 ENCOUNTER — Encounter: Payer: Self-pay | Admitting: Family Medicine

## 2010-07-30 VITALS — BP 100/68 | HR 104 | Temp 98.3°F | Ht 64.0 in | Wt 162.8 lb

## 2010-07-30 DIAGNOSIS — G479 Sleep disorder, unspecified: Secondary | ICD-10-CM

## 2010-07-30 DIAGNOSIS — E785 Hyperlipidemia, unspecified: Secondary | ICD-10-CM

## 2010-07-30 DIAGNOSIS — Z79899 Other long term (current) drug therapy: Secondary | ICD-10-CM

## 2010-07-30 DIAGNOSIS — Z1231 Encounter for screening mammogram for malignant neoplasm of breast: Secondary | ICD-10-CM

## 2010-07-30 DIAGNOSIS — IMO0001 Reserved for inherently not codable concepts without codable children: Secondary | ICD-10-CM

## 2010-07-30 DIAGNOSIS — F17200 Nicotine dependence, unspecified, uncomplicated: Secondary | ICD-10-CM

## 2010-07-30 MED ORDER — ESTRADIOL-NORETHINDRONE ACET 0.5-0.1 MG PO TABS: 1.0000 | ORAL_TABLET | Freq: Every day | ORAL | Status: DC

## 2010-07-30 MED ORDER — ZOLPIDEM TARTRATE 10 MG PO TABS: 10.0000 mg | ORAL_TABLET | Freq: Every evening | ORAL | Status: AC | PRN

## 2010-07-30 MED ORDER — LIDOCAINE 5 % EX PTCH: 1.0000 | MEDICATED_PATCH | CUTANEOUS | Status: AC

## 2010-07-30 MED ORDER — DICLOFENAC SODIUM 1 % TD GEL: 1.0000 "application " | Freq: Four times a day (QID) | TRANSDERMAL | Status: DC

## 2010-07-30 NOTE — Assessment & Plan Note (Signed)
Off of tobacco now Discussed I think this would have tremendous positive impact on her health.

## 2010-07-30 NOTE — Progress Notes (Signed)
55 year old, following up on multiple med probs:  Fu HTN: stable, off of all medications, normalized after 40 pound weight loss  Tobacco d/c: Smoking 08/06/2010. Quit date. Off today Husband had heart attack -- really ready to quit  Fibromyalgia: continued pain, nightly walking, difficulty sleeping.   COPD: stable, doing well - quitting smoking  Lipids, taking every few days. Lipitor, no se  Off of BP meds.  The PMH, PSH, Social History, Family History, Medications, and allergies have been reviewed in [facility], and have been updated if relevant.  Bad hot fashes. Saw a Ob, did exam.   Had heme + stools, f/u colonoscopy by Dr. Juanda Chance. Recall colon needed in 10 years. Aunt and Maternal GF d/c from colon CA.  Patient Active Problem List  Diagnoses  . HYPERLIPIDEMIA  . DEPRESSION, MAJOR, RECURRENT, SEVERE  . TOBACCO ABUSE  . IBS  . BACK PAIN, LUMBAR, WITH RADICULOPATHY  . FIBROMYALGIA  . SLEEP DISORDER  . COLONIC POLYPS, HX OF  . SKIN LESION   Past Medical History  Diagnosis Date  . OA (osteoarthritis)     hips, back  . Irritable bowel syndrome   . HTN (hypertension)     now off all meds  . Fibromyalgia   . Depression   . Hyperlipidemia   . Suicidal behavior     >20 years ago  . COPD (chronic obstructive pulmonary disease)   . Personal history of colonic polyps    Past Surgical History  Procedure Date  . Carpal tunnel release 2007    right  . Cholecystectomy     Dr. Clydie Braun  . Colonoscopy 2008    repeat every 5 years,polyps- [place], [place] (03/29/2005-Hyperplastic polyp)  . Upper gastrointestinal endoscopy 6/11    duodenitis,maloney dilated   History  Substance Use Topics  . Smoking status: Current Everyday Smoker -- 70 years    Types: Cigarettes  . Smokeless tobacco: Not on file   Comment: ~ 6 cigarettes/day  . Alcohol Use: No   Family History  Problem Relation Age of Onset  . Hypertension Mother   . Uterine cancer Mother     ischemic bowel; back pain,  borderline DM  . Coronary artery disease Father   . Heart attack Father   . Coronary artery disease Sister     first MI at 29, 3 cardiac procedures  . Heart attack Sister   . Crohn's disease Sister   . Colon cancer Maternal Aunt   . Colon cancer Maternal Grandfather   . Alcohol abuse Father   . Thyroid disease Sister     dysfunction   Allergies  Allergen Reactions  . Lexiscan Other (See Comments)    Trouble breathing  . Milnacipran     REACTION: tachycardia   Current Outpatient Prescriptions on File Prior to Visit  Medication Sig Dispense Refill  . atorvastatin (LIPITOR) 40 MG tablet Take 40 mg by mouth daily.        . Cholecalciferol (VITAMIN D3) 2000 UNITS TABS Take by mouth daily.        . lansoprazole (PREVACID) 30 MG capsule Take 30 mg by mouth. 1 capsule twice a day 30 minutes before meals       . Multiple Vitamins-Minerals (CENTRUM SILVER PO) Take by mouth daily.        Marland Kitchen tiotropium (SPIRIVA HANDIHALER) 18 MCG inhalation capsule Place 18 mcg into inhaler and inhale daily.        Marland Kitchen DISCONTD: diclofenac sodium (VOLTAREN) 1 % GEL Apply  topically. Apply as directed 4 times daily as needed(3 month supply)       . DISCONTD: Estradiol-Norethindrone Acet (ACTIVELLA) 0.5-0.1 MG per tablet Take 1 tablet by mouth daily.        . Probiotic Product (PROBIOTIC FORMULA PO) Take by mouth daily.        Marland Kitchen DISCONTD: carvedilol (COREG) 3.125 MG tablet Take 3.125 mg by mouth 2 (two) times daily.         ROS: GEN: recent bronchitis, resolved GI: No n/v/d, eating normally - feeling good.  Pulm: No SOB now, s/p recent prednisone course Insomnia persists - severe Pain continues. Interactive and getting along well at home.  Otherwise, ROS is as per the HPI.  GEN: WDWN, NAD, Non-toxic, A & O x 3 HEENT: Atraumatic, Normocephalic. Neck supple. No masses, No LAD. Ears and Nose: No external deformity. CV: RRR, No M/G/R. No JVD. No thrill. No extra heart sounds. PULM: CTA B, no wheezes,  crackles, rhonchi. No retractions. No resp. distress. No accessory muscle use. EXTR: No c/c/e NEURO Normal gait.  PSYCH: Normally interactive. Conversant. Not depressed or anxious appearing.  Calm demeanor.

## 2010-07-30 NOTE — Assessment & Plan Note (Signed)
Tolerating lipid meds - check Dr. Renaldo Fiddler labs  We will obtain records from the patient's prior physicians.

## 2010-07-30 NOTE — Assessment & Plan Note (Signed)
No improvement, failure of virtually all medical management. Lost 40 pounds. Encouraged, continue working out, will try Ambien for sleep again, now that titrated off all other meds.

## 2010-07-30 NOTE — Patient Instructions (Signed)
F/u 3-4 month

## 2010-07-30 NOTE — Assessment & Plan Note (Signed)
Chronic, under poor control - trial of ambien If not work, pt will call and we can try [place]

## 2010-07-31 ENCOUNTER — Telehealth: Payer: Self-pay | Admitting: *Deleted

## 2010-07-31 NOTE — Telephone Encounter (Signed)
Message copied by Jesse Fall on Tue Jul 31, 2010  1:23 PM ------      Message from: Lina Sar      Created: Tue Jul 31, 2010 12:30 PM      Regarding: change recall       Rene Kocher, could You, please, change her colon  recall to 5 years? Thank You

## 2010-07-31 NOTE — Telephone Encounter (Signed)
Changed recall to 5 years as per Dr. Juanda Chance

## 2010-08-02 NOTE — Progress Notes (Signed)
Addended by: Hannah Beat on: 08/02/2010 12:59 PM   Modules accepted: Orders

## 2010-08-08 ENCOUNTER — Ambulatory Visit

## 2010-08-08 ENCOUNTER — Encounter: Payer: Self-pay | Admitting: Family Medicine

## 2010-08-09 ENCOUNTER — Ambulatory Visit

## 2010-10-31 ENCOUNTER — Ambulatory Visit (INDEPENDENT_AMBULATORY_CARE_PROVIDER_SITE_OTHER): Admitting: Family Medicine

## 2010-10-31 ENCOUNTER — Encounter: Payer: Self-pay | Admitting: Family Medicine

## 2010-10-31 VITALS — BP 120/72 | HR 92 | Temp 97.9°F | Ht 64.0 in | Wt 166.8 lb

## 2010-10-31 DIAGNOSIS — R300 Dysuria: Secondary | ICD-10-CM

## 2010-10-31 LAB — POCT URINALYSIS DIPSTICK
Bilirubin, UA: NEGATIVE
Glucose, UA: NEGATIVE
Nitrite, UA: NEGATIVE
Spec Grav, UA: 1.025
pH, UA: 5

## 2010-10-31 MED ORDER — CIPROFLOXACIN HCL 250 MG PO TABS: 250.0000 mg | ORAL_TABLET | Freq: Two times a day (BID) | ORAL | Status: AC

## 2010-10-31 NOTE — Progress Notes (Signed)
  Subjective:    Patient ID: Lindsay Morse, female    DOB: 1955-04-27, 55 y.o.   MRN: [id number]  HPI  Johnathon Mittal, a 55 y.o. female presents today in the office for the following:    Up and down to go to the bathroom A week or so.  No pain.  Urgency most all of the time.  No blood in urine. Some lower abd discomfort  The PMH, PSH, Social History, Family History, Medications, and allergies have been reviewed in [facility], and have been updated if relevant.  Review of Systems ROS: GEN: Acute illness details above GI: Tolerating PO intake GU: maintaining adequate hydration and urination Pulm: No SOB Interactive and getting along well at home.  Otherwise, ROS is as per the HPI.     Objective:   Physical Exam   Physical Exam  Blood pressure 120/72, pulse 92, temperature 97.9 F (36.6 C), temperature source Oral, height 5\' 4"  (1.626 m), weight 166 lb 12.8 oz (75.66 kg), SpO2 98.00%.  GEN: WDWN, A&Ox4,NAD. Non-toxic HEENT: Atraumatc, normocephalic. CV: RRR, No M/G/R PULM: CTA B, No wheezes, crackles, or rhonchi ABD: S, NT, ND, +BS, no rebound. No CVAT. + suprapubic tenderness. EXT: No c/c/e       Assessment & Plan:   1. Dysuria  POCT Urinalysis Dipstick, ciprofloxacin (CIPRO) 250 MG tablet, Urine culture   Probable UTI. Treat presumptively, check culture

## 2010-11-02 LAB — URINE CULTURE

## 2011-06-03 ENCOUNTER — Ambulatory Visit (INDEPENDENT_AMBULATORY_CARE_PROVIDER_SITE_OTHER): Payer: Medicare Other | Admitting: Family Medicine

## 2011-06-03 ENCOUNTER — Ambulatory Visit (INDEPENDENT_AMBULATORY_CARE_PROVIDER_SITE_OTHER)
Admission: RE | Admit: 2011-06-03 | Discharge: 2011-06-03 | Disposition: A | Discharge status: Home / Self Care | Payer: Medicare Other | Source: Ambulatory Visit | Attending: Family Medicine | Admitting: Family Medicine

## 2011-06-03 ENCOUNTER — Encounter: Payer: Self-pay | Admitting: Family Medicine

## 2011-06-03 VITALS — BP 110/72 | HR 68 | Temp 97.4°F | Ht 64.0 in | Wt 169.8 lb

## 2011-06-03 DIAGNOSIS — J209 Acute bronchitis, unspecified: Secondary | ICD-10-CM

## 2011-06-03 DIAGNOSIS — R059 Cough, unspecified: Secondary | ICD-10-CM

## 2011-06-03 DIAGNOSIS — R05 Cough: Secondary | ICD-10-CM

## 2011-06-03 DIAGNOSIS — J439 Emphysema, unspecified: Secondary | ICD-10-CM

## 2011-06-03 DIAGNOSIS — J441 Chronic obstructive pulmonary disease with (acute) exacerbation: Secondary | ICD-10-CM

## 2011-06-03 DIAGNOSIS — J438 Other emphysema: Secondary | ICD-10-CM

## 2011-06-03 MED ORDER — LIDOCAINE 5 % EX PTCH: 2.0000 | MEDICATED_PATCH | CUTANEOUS | Status: DC

## 2011-06-03 MED ORDER — PREDNISONE 20 MG PO TABS: 40.0000 mg | ORAL_TABLET | Freq: Every day | ORAL | Status: AC

## 2011-06-03 MED ORDER — DOXYCYCLINE HYCLATE 100 MG PO TABS: 100.0000 mg | ORAL_TABLET | Freq: Two times a day (BID) | ORAL | Status: AC

## 2011-06-03 MED ORDER — TIOTROPIUM BROMIDE MONOHYDRATE 18 MCG IN CAPS: 18.0000 ug | ORAL_CAPSULE | Freq: Every day | RESPIRATORY_TRACT | Status: DC

## 2011-06-03 NOTE — Progress Notes (Signed)
Patient Name: Lindsay Morse Date of Birth: 1955-03-26 Age: 56 y.o. Medical Record Number: [id number] Gender: female Date of Encounter: 06/03/2011  History of Present Illness:  Lindsay Morse is a 56 y.o. very pleasant female patient who presents with the following:  Husband had CABG and a aortic valve  Sister in hospital in every night.   Ran a fever, a week ago Sunday. Has been wheezing and lungs are hurting. Yesterday was bright red blood from her nose. Prod cough  Fever resolved. Coughing a great deal  Came home last weekend, came home from the grocery. Came out and had to call her husband to come get her felt some strobe lights, and felt like could not see anything. Lasted maybe five minutes, and never has had tat happen before. Could not see. 9 days ago.   Past Medical History, Surgical History, Social History, Family History, Problem List, Medications, and Allergies have been reviewed and updated if relevant.  Review of Systems: ROS: GEN: Acute illness details above GI: Tolerating PO intake GU: maintaining adequate hydration and urination Pulm: No SOB Interactive and getting along well at home.  Otherwise, ROS is as per the HPI.   Physical Examination: Filed Vitals:   06/03/11 0939  BP: 110/72  Pulse: 68  Temp: 97.4 F (36.3 C)  TempSrc: Oral  Height: 5\' 4"  (1.626 m)  Weight: 169 lb 12.8 oz (77.021 kg)  SpO2: 99%    Body mass index is 29.15 kg/(m^2).   GEN: A and O x 3. WDWN. NAD.    ENT: Nose clear, ext NML.  No LAD.  No JVD.  TM's clear. Oropharynx clear.  PULM: Normal WOB, no distress. No crackles, wheezes, rhonchi. CV: RRR, no M/G/R, No rubs, No JVD.   EXT: warm and well-perfused, No c/c/e. PSYCH: Pleasant and conversant.   Assessment and Plan:  1. Bronchitis, acute    2. Cough  DG Chest 2 View  3. COPD with emphysema    4. COPD exacerbation      Orders Today: Orders Placed This Encounter  Procedures  . DG Chest 2 View    Standing Status: Future       Number of Occurrences: 1     Standing Expiration Date: 08/02/2012    Order Specific Question:  Preferred imaging location?    Answer:  [facility]    Order Specific Question:  Reason for exam:    Answer:  coughing    Medications Today: Meds ordered this encounter  Medications  . DISCONTD: lidocaine (LIDODERM) 5 %    Sig: Place 1 patch onto the skin daily. Remove & Discard patch within 12 hours or as directed by MD  . tiotropium (SPIRIVA HANDIHALER) 18 MCG inhalation capsule    Sig: Place 1 capsule (18 mcg total) into inhaler and inhale daily.    Dispense:  30 capsule    Refill:  11  . lidocaine (LIDODERM) 5 %    Sig: Place 2 patches onto the skin daily. Remove & Discard patch within 12 hours or as directed by MD    Dispense:  60 patch    Refill:  11  . predniSONE (DELTASONE) 20 MG tablet    Sig: Take 2 tablets (40 mg total) by mouth daily.    Dispense:  14 tablet    Refill:  0  . doxycycline (VIBRA-TABS) 100 MG tablet    Sig: Take 1 tablet (100 mg total) by mouth 2 (two) times daily.    Dispense:  20 tablet  Refill:  0    CXR, AP and Lateral Indication: Cough, shortness or breath: Findings: No focal infiltrate, there is some increased peribronchial markings which may represent a viral process vs. atypical PNA   Acute bronchitis with COPD exac ABX, steroids  All visual symptoms have resolved. Unclear etiology. Start ASA -- brain MRI reviewed from before

## 2011-06-04 ENCOUNTER — Other Ambulatory Visit: Payer: Self-pay | Admitting: *Deleted

## 2011-06-04 ENCOUNTER — Telehealth: Payer: Self-pay | Admitting: *Deleted

## 2011-06-04 MED ORDER — TIOTROPIUM BROMIDE MONOHYDRATE 18 MCG IN CAPS: 18.0000 ug | ORAL_CAPSULE | Freq: Every day | RESPIRATORY_TRACT | Status: DC

## 2011-06-04 MED ORDER — LANSOPRAZOLE 30 MG PO CPDR: 30.0000 mg | DELAYED_RELEASE_CAPSULE | Freq: Every day | ORAL | Status: DC

## 2011-06-04 NOTE — Telephone Encounter (Signed)
noted 

## 2011-06-04 NOTE — Telephone Encounter (Signed)
[facility] says it would be cheaper for patient to get medication through mail order. Printed rx and put in your inbox for you signature then will advise patient when ready.

## 2011-06-17 ENCOUNTER — Ambulatory Visit (INDEPENDENT_AMBULATORY_CARE_PROVIDER_SITE_OTHER): Payer: Medicare Other | Admitting: Family Medicine

## 2011-06-17 ENCOUNTER — Encounter: Payer: Self-pay | Admitting: Family Medicine

## 2011-06-17 VITALS — BP 110/70 | HR 102 | Temp 98.4°F | Ht 64.0 in | Wt 166.0 lb

## 2011-06-17 DIAGNOSIS — R42 Dizziness and giddiness: Secondary | ICD-10-CM

## 2011-06-17 DIAGNOSIS — H5359 Other color vision deficiencies: Secondary | ICD-10-CM

## 2011-06-17 DIAGNOSIS — H5350 Unspecified color vision deficiencies: Secondary | ICD-10-CM

## 2011-06-17 MED ORDER — PANTOPRAZOLE SODIUM 40 MG PO TBEC: 40.0000 mg | DELAYED_RELEASE_TABLET | Freq: Every day | ORAL | Status: DC

## 2011-06-17 NOTE — Progress Notes (Addendum)
Patient Name: Lindsay Morse Date of Birth: [birth year] Medical Record Number: [id number] Gender: female Date of Encounter: 06/17/2011  History of Present Illness:  Lindsay Morse is a 56 y.o. very pleasant female patient who presents with the following:  December started off and on -- not all at once. When getting out of of once, then everything spinning.  Starting in December, the patient has had some episodes of intermittent dizziness. She describes having some sensation of the room spinning the last several minutes but are not brought on by any certain precipitating movement. Several weeks ago, the patient also had some blurring and distortion of her vision and coloration of her vision that lasted for 5-10 minutes as well. These episodes have been persistent in nature. No known history of chronic carrier dysfunction. No known history of Mnire's or other such conditions.   Patient Active Problem List  Diagnoses  . HYPERLIPIDEMIA  . Depression  . TOBACCO ABUSE  . IBS  . FIBROMYALGIA  . SLEEP DISORDER  . COLONIC POLYPS, HX OF  . COPD with emphysema   Past Medical History  Diagnosis Date  . OA (osteoarthritis)     hips, back  . Irritable bowel syndrome   . HTN (hypertension)     now off all meds  . Fibromyalgia   . Depression   . Hyperlipidemia   . Suicidal behavior     >20 years ago  . COPD (chronic obstructive pulmonary disease)   . Personal history of colonic polyps    Past Surgical History  Procedure Date  . Carpal tunnel release 2007    right  . Cholecystectomy     Dr. Clydie Braun  . Colonoscopy 2008    repeat every 5 years,polyps- [place], [place] (03/29/2005-Hyperplastic polyp)  . Upper gastrointestinal endoscopy 6/11    duodenitis,maloney dilated   History  Substance Use Topics  . Smoking status: Current Everyday Smoker -- 70 years    Types: Cigarettes  . Smokeless tobacco: Not on file   Comment: ~ 6 cigarettes/day  . Alcohol Use: No   Family History  Problem  Relation Age of Onset  . Hypertension Mother   . Uterine cancer Mother     ischemic bowel; back pain, borderline DM  . Coronary artery disease Father   . Heart attack Father   . Coronary artery disease Sister     first MI at 67, 3 cardiac procedures  . Heart attack Sister   . Crohn's disease Sister   . Colon cancer Maternal Aunt   . Colon cancer Maternal Grandfather   . Alcohol abuse Father   . Thyroid disease Sister     dysfunction   Allergies  Allergen Reactions  . Lexiscan Other (See Comments)    Trouble breathing  . Milnacipran     REACTION: tachycardia    Medication list has been reviewed and updated.  Review of Systems:  GEN: No acute illnesses, no fevers, chills. GI: No n/v/d, eating normally Pulm: No SOB Interactive and getting along well at home.  Otherwise, ROS is as per the HPI.   Physical Examination: Filed Vitals:   06/17/11 1552 06/17/11 1553 06/17/11 1554 06/17/11 1556  BP: 130/70 130/70  110/70  Pulse: 96 99  102  Temp:   98.4 F (36.9 C)   TempSrc:   Oral   Height:   5\' 4"  (1.626 m)   Weight:   166 lb (75.297 kg)   SpO2:   98%     Body mass index is 28.49  kg/(m^2).   GEN: WDWN, NAD, Non-toxic, A & O x 3 HEENT: Atraumatic, Normocephalic. Neck supple. No masses, No LAD. Ears and Nose: No external deformity. CV: RRR, No M/G/R. No JVD. No thrill. No extra heart sounds. PULM: CTA B, no wheezes, crackles, rhonchi. No retractions. No resp. distress. No accessory muscle use. ABD: S, NT, ND, +BS. No rebound tenderness. No HSM.  EXTR: No c/c/e  Neuro: CN 2-12 grossly intact. PERRLA. EOMI. Sensation intact throughout. Str 5/5 all extremities. DTR 2+. No clonus. A and o x 4. Romberg neg. Finger nose neg. Heel -shin neg.   PSYCH: Normally interactive. Conversant. Not depressed or anxious appearing.  Calm demeanor.     Assessment and Plan:  1. Dizziness  MR Brain Wo Contrast  2. Visual color changes  MR Brain Wo Contrast   4 month history of  persistent dizziness intermittently. Recent visual color changes several weeks ago lasting up to 10 minutes. These are distinct episodes and events, and we must exclude a unifying potential neurological lesion. Her neurological exam is  Normal today. Obtain an MRI of the brain without contrast to evaluate for cranial nerve 8 tumor, and exclude multiple sclerosis or other demyelinating pathology.  Orders Today: Orders Placed This Encounter  Procedures  . MR Brain Wo Contrast    Standing Status: Future     Number of Occurrences:      Standing Expiration Date: 08/16/2012    WT-166/NOT CLAUS/PREV MR BRAIN AUG 2010/NO NEEDS PER OFFICE/EPIC ORDER/ [facility]    Order Specific Question:  Does the patient have a pacemaker, internal devices, implants, aneury    Answer:  No    Order Specific Question:  Preferred imaging location?    Answer:  [facility]    Order Specific Question:  Reason for exam:    Answer:  dizziness, visual change, eval for potential small CVA, potential MS, or other lesions    Medications Today: Meds ordered this encounter  Medications  . pantoprazole (PROTONIX) 40 MG tablet    Sig: Take 1 tablet (40 mg total) by mouth daily.    Dispense:  90 tablet    Refill:  3    Addendum:  Mr Brain Wo Contrast  06/20/2011  *RADIOLOGY REPORT*  Clinical Data: 56 year old female with dizziness, visual disturbance.  Intermittent episodes.  MRI HEAD WITHOUT CONTRAST  Technique:  Multiplanar, multiecho pulse sequences of the brain and surrounding structures were obtained according to standard protocol without intravenous contrast.  Comparison: Brain MRI 11/02/2008.  Findings: No restricted diffusion to suggest acute infarction.  No midline shift, mass effect, evidence of mass lesion, ventriculomegaly, extra-axial collection or acute intracranial hemorrhage.  Cervicomedullary junction and pituitary are within normal limits.  Major intracranial vascular flow voids are  stable. Stable mild T2 hyperintensity in the central pons.  Otherwise normal gray and white matter signal throughout the brain.  Negative visualized cervical spine.  Normal bone marrow signal. Visualized orbit soft tissues are within normal limits.  Mild maxillary sinus mucosal thickening.  Other Visualized paranasal sinuses and mastoids are clear.  Negative scalp soft tissues.  IMPRESSION: Stable and largely unremarkable noncontrast MRI appearance of the brain with no acute or interval intracranial abnormality.  Mild nonspecific signal changes in the pons.  Original Report Authenticated By: Harley Hallmark, M.D.   Obtain carotid dopplers to evaluate for potential carotid stenosis contributing to potential TIA

## 2011-06-17 NOTE — Patient Instructions (Signed)
REFERRAL: GO THE THE FRONT ROOM AT THE ENTRANCE OF OUR CLINIC, NEAR CHECK IN. ASK FOR MARION. SHE WILL HELP YOU SET UP YOUR REFERRAL. DATE: TIME:  

## 2011-06-20 ENCOUNTER — Ambulatory Visit
Admission: RE | Admit: 2011-06-20 | Discharge: 2011-06-20 | Disposition: A | Discharge status: Home / Self Care | Payer: Medicare Other | Source: Ambulatory Visit | Attending: Family Medicine | Admitting: Family Medicine

## 2011-06-20 DIAGNOSIS — H5350 Unspecified color vision deficiencies: Secondary | ICD-10-CM

## 2011-06-20 DIAGNOSIS — R42 Dizziness and giddiness: Secondary | ICD-10-CM

## 2011-06-20 NOTE — Progress Notes (Signed)
Addended by: Hannah Beat on: 06/20/2011 05:36 PM   Modules accepted: Orders

## 2011-07-04 ENCOUNTER — Encounter (INDEPENDENT_AMBULATORY_CARE_PROVIDER_SITE_OTHER): Payer: Medicare Other

## 2011-07-04 DIAGNOSIS — R42 Dizziness and giddiness: Secondary | ICD-10-CM

## 2011-07-04 DIAGNOSIS — I6529 Occlusion and stenosis of unspecified carotid artery: Secondary | ICD-10-CM

## 2011-07-04 DIAGNOSIS — H5350 Unspecified color vision deficiencies: Secondary | ICD-10-CM

## 2011-08-29 ENCOUNTER — Ambulatory Visit (INDEPENDENT_AMBULATORY_CARE_PROVIDER_SITE_OTHER): Payer: Medicare Other | Admitting: Family Medicine

## 2011-08-29 ENCOUNTER — Encounter: Payer: Self-pay | Admitting: Family Medicine

## 2011-08-29 VITALS — BP 120/72 | HR 78 | Temp 98.6°F | Ht 64.0 in | Wt 164.5 lb

## 2011-08-29 DIAGNOSIS — M2550 Pain in unspecified joint: Secondary | ICD-10-CM

## 2011-08-29 DIAGNOSIS — M5412 Radiculopathy, cervical region: Secondary | ICD-10-CM

## 2011-08-29 DIAGNOSIS — R21 Rash and other nonspecific skin eruption: Secondary | ICD-10-CM

## 2011-08-29 MED ORDER — ESTRADIOL-NORETHINDRONE ACET 1-0.5 MG PO TABS: 1.0000 | ORAL_TABLET | Freq: Every day | ORAL | Status: DC

## 2011-08-29 MED ORDER — CYCLOBENZAPRINE HCL 10 MG PO TABS: 10.0000 mg | ORAL_TABLET | Freq: Three times a day (TID) | ORAL | Status: AC | PRN

## 2011-08-29 MED ORDER — PREDNISONE 20 MG PO TABS: ORAL_TABLET | ORAL | Status: DC

## 2011-08-29 NOTE — Progress Notes (Signed)
Nature conservation officer at [facility] [address] [place] [address] Phone: [phone] Fax: [phone]   Patient Name: Lindsay Morse Date of Birth: [birth year] Age: 56 y.o. Medical Record Number: [id number] Gender: female Date of Encounter: 08/29/2011  History of Present Illness:  Lindsay Morse is a 56 y.o. very pleasant female patient who presents with the following:  Moved and was maybe downsizing and then started to get some throbbing ache and for a week or two, now has some numbness and tingling on the right side all the way to the elbow. Feels like ice water on skin. Str intact. Some neck pain and shoulder blade pain.  MRI CERVICAL SPINE WITHOUT CONTRAST, 10/12/2009    Technique:  Multiplanar and multiecho pulse sequences of the cervical spine, to include the craniocervical junction and cervicothoracic junction, were obtained according to standard protocol without intravenous contrast.    Comparison: None.    Findings: There is straightening of the normal cervical lordosis. Vertebral body height and alignment are normal.  Mild degenerative discogenic marrow signal changes is noted at C5-6.  No worrisome marrow lesion.  The craniocervical junction is normal and cervical cord signal is normal.    C2-3:  There is posterior bony ridging eccentric to the left with left worse than right facet arthropathy.  Moderate left foraminal narrowing is present.  Central canal and right foramen open.    C3-4:  Bilateral uncovertebral disease is worse on the right. There is bilateral facet degenerative change.  Posterior bony ridging narrows but does not efface the ventral thecal sac. Moderately severe right and mild to moderate left foraminal narrowing is seen.    C4-5:  Disc osteophyte complexes and uncovertebral disease.  Disc effaces the ventral thecal sac.  Moderately severe right and moderate left foraminal narrowing seen.    C5-6:  Disc osteophyte complex, uncovertebral  disease and facet arthropathy are seen.  The ventral thecal sac is effaced.  Mild bilateral foraminal narrowing is present.    C6-7:  There is a disc bulge narrowing but not effacing the ventral thecal sac.  Uncovertebral disease is seen on the left causing mild left foraminal narrowing.  Right foramen open.    C7-T1:  The patient has a focal right paracentral disc protrusion. The disc indents the ventral thecal sac but does not deform the cord.  It could impact the right T1 root.  Left foramen open.    IMPRESSION:    1.  Focal right-sided protrusion at C7-T1 could impact the right T1 root is enters the foramen. 2.  Disc osteophyte complexes at C4-5 and C5-6 efface the ventral thecal sac. 3.  Uncovertebral disease causes multilevel foraminal narrowing as detailed above. 4.  Facet arthropathy which appears worse on the left at C2-3.   Provider: Isaiah Blakes  Also with chronic pain, occ rash, ? If this could be lupus - has had a large prior rheum work-up in the past, but no ana or other labs found  Past Medical History, Surgical History, Social History, Family History, Problem List, Medications, and Allergies have been reviewed and updated if relevant.  Prior to Admission medications   Medication Sig Start Date End Date Taking? Authorizing Provider  diclofenac sodium (VOLTAREN) 1 % GEL Apply 1 application topically 4 (four) times daily. Apply as directed 4 times daily as needed(3 month supply) 07/30/10  Yes Hannah Beat, MD  Estradiol-Norethindrone Acet (ACTIVELLA) 0.5-0.1 MG per tablet Take 1 tablet by mouth daily. 07/30/10  Yes Hannah Beat, MD  lidocaine (LIDODERM) 5 % Place 2 patches onto the skin daily. Remove & Discard patch within 12 hours or as directed by MD 06/03/11  Yes Hannah Beat, MD  Multiple Vitamins-Minerals (CENTRUM SILVER PO) Take by mouth daily.     Yes Historical Provider, MD  pantoprazole (PROTONIX) 40 MG tablet Take 1 tablet (40 mg total) by mouth daily.  06/17/11 06/16/12 Yes Victorine Mcnee, MD  tiotropium (SPIRIVA HANDIHALER) 18 MCG inhalation capsule Place 1 capsule (18 mcg total) into inhaler and inhale daily. 06/04/11  Yes Hannah Beat, MD    Review of Systems:  GEN: No fevers, chills. Nontoxic. Primarily MSK c/o today. MSK: Detailed in the HPI GI: tolerating PO intake without difficulty Neuro: detailed above Otherwise the pertinent positives of the ROS are noted above.    Physical Examination: Filed Vitals:   08/29/11 0827  BP: 120/72  Pulse: 78  Temp: 98.6 F (37 C)   Filed Vitals:   08/29/11 0827  Height: 5\' 4"  (1.626 m)  Weight: 164 lb 8 oz (74.617 kg)   Body mass index is 28.24 kg/(m^2). Ideal Body Weight: Weight in (lb) to have BMI = 25: 145.3    GEN: Well-developed,well-nourished,in no acute distress; alert,appropriate and cooperative throughout examination HEENT: Normocephalic and atraumatic without obvious abnormalities. Ears, externally no deformities PULM: Breathing comfortably in no respiratory distress EXT: No clubbing, cyanosis, or edema PSYCH: Normally interactive. Cooperative during the interview. Pleasant. Friendly and conversant. Not anxious or depressed appearing. Normal, full affect.  CERVICAL SPINE EXAM Range of motion: Flexion, extension, lateral bending, and rotation: mild restriction Pain with terminal motion: y Spinous Processes: nt SCM: NT Upper paracervical muscles: ttp Upper traps: NT C5-T1 intact, motor Sensation - "different" UE compared to L, upper arm   Assessment and Plan:  1. Cervical radiculopathy    2. Arthralgia  ANA, Anti-DNA antibody, double-stranded, Anti-Smith antibody  3. Rash  ANA, Anti-DNA antibody, double-stranded, Anti-Smith antibody   pred burst and taper Flexeril at night  Check for lupus  Orders Today: Orders Placed This Encounter  Procedures  . ANA  . Anti-DNA antibody, double-stranded  . Anti-Smith antibody    Medications Today: Meds ordered this  encounter  Medications  . cyclobenzaprine (FLEXERIL) 10 MG tablet    Sig: Take 1 tablet (10 mg total) by mouth 3 (three) times daily as needed for muscle spasms.    Dispense:  45 tablet    Refill:  2  . predniSONE (DELTASONE) 20 MG tablet    Sig: 2 tabs po daily for 4 days, then 1 po daily for 4 days    Dispense:  12 tablet    Refill:  0  . estradiol-norethindrone (ACTIVELLA) 1-0.5 MG per tablet    Sig: Take 1 tablet by mouth daily.    Dispense:  90 tablet    Refill:  3     Hannah Beat, MD

## 2011-08-30 LAB — ANTI-SMITH ANTIBODY: ENA SM Ab Ser-aCnc: <1 AU/mL (ref <30)

## 2011-08-30 LAB — ANTI-DNA ANTIBODY, DOUBLE-STRANDED: ds DNA Ab: <1 IU/mL (ref <30)

## 2011-08-30 LAB — ANA: Anti Nuclear Antibody(ANA): NEGATIVE

## 2011-09-04 ENCOUNTER — Encounter: Payer: Self-pay | Admitting: *Deleted

## 2011-09-18 ENCOUNTER — Encounter: Payer: Self-pay | Admitting: Family Medicine

## 2011-09-18 ENCOUNTER — Ambulatory Visit (INDEPENDENT_AMBULATORY_CARE_PROVIDER_SITE_OTHER): Payer: Medicare Other | Admitting: Family Medicine

## 2011-09-18 VITALS — BP 120/78 | HR 96 | Temp 98.6°F | Ht 64.0 in | Wt 165.5 lb

## 2011-09-18 DIAGNOSIS — J441 Chronic obstructive pulmonary disease with (acute) exacerbation: Secondary | ICD-10-CM

## 2011-09-18 DIAGNOSIS — J449 Chronic obstructive pulmonary disease, unspecified: Secondary | ICD-10-CM

## 2011-09-18 MED ORDER — PREDNISONE 20 MG PO TABS: ORAL_TABLET | ORAL | Status: DC

## 2011-09-18 MED ORDER — AZITHROMYCIN 250 MG PO TABS: ORAL_TABLET | ORAL | Status: AC

## 2011-09-18 MED ORDER — GUAIFENESIN-CODEINE 100-10 MG/5ML PO SYRP: 5.0000 mL | ORAL_SOLUTION | Freq: Three times a day (TID) | ORAL | Status: AC | PRN

## 2011-09-18 NOTE — Progress Notes (Signed)
  Subjective:    Patient ID: Lindsay Morse, female    DOB: Dec 04, 1955, 56 y.o.   MRN: [id number]  Cough This is a new problem. The current episode started in the past 7 days. The problem has been gradually worsening. The cough is productive of sputum and productive of bloody sputum (change in sputum). Associated symptoms include ear congestion, a fever, myalgias, rhinorrhea, a sore throat, shortness of breath and wheezing. Associated symptoms comments: Some sinus pain. Treatments tried: using coricidin and mucinex. The treatment provided mild relief. Her past medical history is significant for emphysema. tobacco user    Does not have albuterol inhaler but uses Spiriva regularly.  Recently on steroids for cervical disc issue. Just finished them 1 and 1/2 weeks ago.  Review of Systems  Constitutional: Positive for fever.  HENT: Positive for sore throat and rhinorrhea.   Respiratory: Positive for cough, shortness of breath and wheezing.   Musculoskeletal: Positive for myalgias.       Objective:   Physical Exam  Constitutional: Vital signs are normal. She appears well-developed and well-nourished. She is cooperative.  Non-toxic appearance. She does not appear ill. No distress.       Hoarse voice, smells of smoke  HENT:  Head: Normocephalic.  Right Ear: Hearing, tympanic membrane, external ear and ear canal normal. Tympanic membrane is not erythematous, not retracted and not bulging.  Left Ear: Hearing, tympanic membrane, external ear and ear canal normal. Tympanic membrane is not erythematous, not retracted and not bulging.  Nose: Mucosal edema and rhinorrhea present. Right sinus exhibits no maxillary sinus tenderness and no frontal sinus tenderness. Left sinus exhibits no maxillary sinus tenderness and no frontal sinus tenderness.  Mouth/Throat: Uvula is midline, oropharynx is clear and moist and mucous membranes are normal.  Eyes: Conjunctivae, EOM and lids are normal. Pupils are equal, round,  and reactive to light. No foreign bodies found.  Neck: Trachea normal and normal range of motion. Neck supple. Carotid bruit is not present. No mass and no thyromegaly present.  Cardiovascular: Normal rate, regular rhythm, S1 normal, S2 normal, normal heart sounds, intact distal pulses and normal pulses.  Exam reveals no gallop and no friction rub.   No murmur heard. Pulmonary/Chest: Effort normal. Not tachypneic. No respiratory distress. She has no decreased breath sounds. She has wheezes in the right lower field and the left lower field. She has no rhonchi. She has no rales.  Neurological: She is alert.  Skin: Skin is warm, dry and intact. No rash noted.  Psychiatric: Her speech is normal and behavior is normal. Judgment normal. Her mood appears not anxious. Cognition and memory are normal. She does not exhibit a depressed mood.          Assessment & Plan:

## 2011-09-18 NOTE — Assessment & Plan Note (Signed)
Treat with antibiotics given sputum change. STOP smoking.  Prednisone for SOB/wheeze.  Cough suppressant at night.  Call if not improving as expected.

## 2011-09-18 NOTE — Patient Instructions (Signed)
Rest. Complete course of antibiotics. Cough suppressant at noight, mucinex during the day. Prednisone for breathing issues as well as neck.  Call Dr. Patsy Lager if neck pain not improved after second course of prednisone.

## 2011-11-13 ENCOUNTER — Encounter: Payer: Self-pay | Admitting: Family Medicine

## 2011-11-13 ENCOUNTER — Ambulatory Visit (INDEPENDENT_AMBULATORY_CARE_PROVIDER_SITE_OTHER): Payer: Medicare Other | Admitting: Family Medicine

## 2011-11-13 VITALS — BP 140/90 | HR 80 | Temp 98.3°F | Wt 171.0 lb

## 2011-11-13 DIAGNOSIS — M542 Cervicalgia: Secondary | ICD-10-CM

## 2011-11-13 DIAGNOSIS — IMO0002 Reserved for concepts with insufficient information to code with codable children: Secondary | ICD-10-CM

## 2011-11-13 DIAGNOSIS — M5416 Radiculopathy, lumbar region: Secondary | ICD-10-CM

## 2011-11-13 MED ORDER — HYDROCODONE-ACETAMINOPHEN 10-325 MG PO TABS: 1.0000 | ORAL_TABLET | Freq: Three times a day (TID) | ORAL | Status: AC | PRN

## 2011-11-13 NOTE — Progress Notes (Signed)
Nature conservation officer at [facility] [address] [place] [address] Phone: [phone] Fax: [phone]  Date:  11/13/2011   Name:  Lindsay Morse   DOB:  [birth year]   MRN:  [id number] Gender: female Age: 56 y.o.  PCP:  Hannah Beat, MD    Chief Complaint: Pain in both hips, going down into legs and groin   History of Present Illness:  Lindsay Morse is a 56 y.o. pleasant patient who presents with the following:  A patient I know very well whopresented 2 months ago with a flare up of her known cervical disease is having some numbness in and around her lower neck and trapezius region on the RIGHT, without any focal weakness, now presents with acute exacerbation of bilateral radicular pain in the lumbar spine. No numbness, tingling, bowel or bladder incontinence.  Neck pain is ongoing, but relatively stable compared to the last time I examined the patient. At that time, she has been doing some Flexeril as well as some Voltaren gel. She also has some lidocaine patches, and I placed her on a relatively long prednisone taper at that time. Since that time she had an infection and some wheezing and one of my partners placed her on some prednisone orally as well. None of these significantly impacted her neck.  Discs in back, down groin and in the front of her legs. Discks in her neck, has some numb feeling on the R side.    Objective Data Reviewed (7/28/20111 MRI's)  MRI LUMBAR SPINE WITHOUT CONTRAST    Technique:  Multiplanar and multiecho pulse sequences of the lumbar spine were obtained without intravenous contrast.    Comparison: None.    Findings: Vertebral body height, signal and alignment are maintained.  The conus medullaris is normal in signal position. 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 Note is made of some sclerosis about the right sacral iliac joint. Imaged intra-abdominal contents are unremarkable.    T11-12:  Minimal disc bulge  without central canal or foraminal narrowing.    T12-L1:  Negative.    L1-2:  Negative.    L2-3:  Moderate facet arthropathy and mild disc bulging.  The central spinal canal and neural foramina remain widely patent.    L3-4:  Mild disc bulging and moderate facet degenerative change. Central canal is mildly narrowed.  Foramina are open.    L4-5:  Moderate to advanced bilateral facet degenerative disease with ligamentum flavum thickening and a disc bulge.  There is mild to moderate central canal narrowing.  Mild right foraminal narrowing is also seen.    L5-S1:  There is some facet arthropathy and a mild disc bulge. Central spinal canal and neural foramina remain open.    IMPRESSION:    1.  Mild to moderate central canal narrowing at L4-5 due to disc bulging and ligamentum flavum thickening.  Facet arthropathy at this level noted. 2.  Mild central canal narrowing L3-4 due to some disc bulging and ligamentum flavum thickening.   MRI CERVICAL SPINE WITHOUT CONTRAST    Technique:  Multiplanar and multiecho pulse sequences of the cervical spine, to include the craniocervical junction and cervicothoracic junction, were obtained according to standard protocol without intravenous contrast.    Comparison: None.    Findings: There is straightening of the normal cervical lordosis. Vertebral body height and alignment are normal.  Mild degenerative discogenic marrow signal changes is noted at C5-6.  No worrisome marrow lesion.  The craniocervical junction is  normal and cervical cord signal is normal.    C2-3:  There is posterior bony ridging eccentric to the left with left worse than right facet arthropathy.  Moderate left foraminal narrowing is present.  Central canal and right foramen open.    C3-4:  Bilateral uncovertebral disease is worse on the right. There is bilateral facet degenerative change.  Posterior bony ridging narrows but does not efface the ventral thecal  sac. Moderately severe right and mild to moderate left foraminal narrowing is seen.    C4-5:  Disc osteophyte complexes and uncovertebral disease.  Disc effaces the ventral thecal sac.  Moderately severe right and moderate left foraminal narrowing seen.    C5-6:  Disc osteophyte complex, uncovertebral disease and facet arthropathy are seen.  The ventral thecal sac is effaced.  Mild bilateral foraminal narrowing is present.    C6-7:  There is a disc bulge narrowing but not effacing the ventral thecal sac.  Uncovertebral disease is seen on the left causing mild left foraminal narrowing.  Right foramen open.    C7-T1:  The patient has a focal right paracentral disc protrusion. The disc indents the ventral thecal sac but does not deform the cord.  It could impact the right T1 root.  Left foramen open.    IMPRESSION:    1.  Focal right-sided protrusion at C7-T1 could impact the right T1 root is enters the foramen. 2.  Disc osteophyte complexes at C4-5 and C5-6 efface the ventral thecal sac. 3.  Uncovertebral disease causes multilevel foraminal narrowing as detailed above. 4.  Facet arthropathy which appears worse on the left at C2-3.   Provider: Isaiah Blakes    Patient Active Problem List  Diagnosis  . HYPERLIPIDEMIA  . Depression  . TOBACCO ABUSE  . IBS  . FIBROMYALGIA  . SLEEP DISORDER  . COLONIC POLYPS, HX OF  . COPD with emphysema    Past Medical History  Diagnosis Date  . OA (osteoarthritis)     hips, back  . Irritable bowel syndrome   . HTN (hypertension)     now off all meds  . Fibromyalgia   . Depression   . Hyperlipidemia   . Suicidal behavior     >20 years ago  . COPD (chronic obstructive pulmonary disease)   . Personal history of colonic polyps     Past Surgical History  Procedure Date  . Carpal tunnel release 2007    right  . Cholecystectomy     Dr. Clydie Braun  . Colonoscopy 2008    repeat every 5 years,polyps- [place], [place] (03/29/2005-Hyperplastic  polyp)  . Upper gastrointestinal endoscopy 6/11    duodenitis,maloney dilated    History  Substance Use Topics  . Smoking status: Current Everyday Smoker -- 70 years    Types: Cigarettes  . Smokeless tobacco: Not on file   Comment: ~ 6 cigarettes/day  . Alcohol Use: No    Family History  Problem Relation Age of Onset  . Hypertension Mother   . Uterine cancer Mother     ischemic bowel; back pain, borderline DM  . Coronary artery disease Father   . Heart attack Father   . Coronary artery disease Sister     first MI at 73, 3 cardiac procedures  . Heart attack Sister   . Crohn's disease Sister   . Colon cancer Maternal Aunt   . Colon cancer Maternal Grandfather   . Alcohol abuse Father   . Thyroid disease Sister     dysfunction  Allergies  Allergen Reactions  . Regadenoson Other (See Comments)    Trouble breathing  . Milnacipran     REACTION: tachycardia    Medication list has been reviewed and updated.  Current Outpatient Prescriptions on File Prior to Visit  Medication Sig Dispense Refill  . diclofenac sodium (VOLTAREN) 1 % GEL Apply 1 application topically 4 (four) times daily. Apply as directed 4 times daily as needed(3 month supply)  15 Tube  3  . estradiol-norethindrone (ACTIVELLA) 1-0.5 MG per tablet Take 1 tablet by mouth daily.  90 tablet  3  . lidocaine (LIDODERM) 5 % Place 2 patches onto the skin daily. Remove & Discard patch within 12 hours or as directed by MD  60 patch  11  . Multiple Vitamins-Minerals (CENTRUM SILVER PO) Take by mouth daily.        . pantoprazole (PROTONIX) 40 MG tablet Take 1 tablet (40 mg total) by mouth daily.  90 tablet  3  . tiotropium (SPIRIVA HANDIHALER) 18 MCG inhalation capsule Place 1 capsule (18 mcg total) into inhaler and inhale daily.  90 capsule  3  . DISCONTD: atorvastatin (LIPITOR) 40 MG tablet Take 40 mg by mouth daily.          Review of Systems:   GEN: No fevers, chills. Nontoxic. Primarily MSK c/o today. MSK:  Detailed in the HPI GI: tolerating PO intake without difficulty Neuro: as above Otherwise the pertinent positives of the ROS are noted above.    Physical Examination: Filed Vitals:   11/13/11 1608  BP: 140/90  Pulse: 80  Temp: 98.3 F (36.8 C)   Filed Vitals:   11/13/11 1608  Weight: 171 lb (77.565 kg)   There is no height on file to calculate BMI. Ideal Body Weight:     GEN: WDWN, NAD, Non-toxic, Alert & Oriented x 3 HEENT: Atraumatic, Normocephalic.  Ears and Nose: No external deformity. EXTR: No clubbing/cyanosis/edema NEURO: Normal gait.  PSYCH: Normally interactive. Conversant. Not depressed or anxious appearing.  Calm demeanor.   Cervical spine is mild to moderately tender in the posterior aspect, more in and around C4-C7. She also has some diffuse muscular tightness in the paracervical region and trapezius. C5-T1 are intact from a motor and sensory standpoint grossly, however she subjectively feels like there is some difference in around the lower RIGHT sided neck and trapezius  Range of motion is relatively preserved in the cervical spine.  Lumbar spine: Moderately significant tenderness from around L3-S1. Diffuse paraspinal muscular tenderness. Tenderness at the SI joints. Mild tenderness at the trochanteric bursa. Some buttocks tenderness. She is unable to sit for examination. Old flexion, extension as well as lateral bending and rotation are all relatively preserved, however the patient does have radicular symptoms throughout the examination. Sensation is preserved grossly.  Assessment and Plan:  1. Neck pain   2. Lumbar radiculopathy    She has a f/u with Dr. Danielle Dess in about 3 weeks, which I think is appropriate.   Last night she had to take one Vicodin 5 that she had from a long time ago, and he did not ease her pain significantly. Continue with baseline medications, none but to give her some Norco 10 mg to take as needed now. Consideration of epidural steroid  are deferred to Dr. Verlee Rossetti opinion. Diffuse multilevel disease.  Orders Today:  No orders of the defined types were placed in this encounter.    Medications Today: (Includes new updates added during medication reconciliation) Meds ordered this encounter  Medications  . cyclobenzaprine (FLEXERIL) 10 MG tablet    Sig: Take 10 mg by mouth 3 (three) times daily as needed.  Marland Kitchen HYDROcodone-acetaminophen (NORCO) 10-325 MG per tablet    Sig: Take 1 tablet by mouth every 8 (eight) hours as needed for pain.    Dispense:  40 tablet    Refill:  0    Medications Discontinued: Medications Discontinued During This Encounter  Medication Reason  . predniSONE (DELTASONE) 20 MG tablet Error     Hannah Beat, MD

## 2011-11-14 ENCOUNTER — Ambulatory Visit: Payer: Medicare Other | Admitting: Family Medicine

## 2011-11-20 ENCOUNTER — Ambulatory Visit: Payer: Medicare Other | Admitting: Family Medicine

## 2011-12-10 ENCOUNTER — Other Ambulatory Visit: Payer: Self-pay | Admitting: Neurological Surgery

## 2011-12-10 DIAGNOSIS — M47812 Spondylosis without myelopathy or radiculopathy, cervical region: Secondary | ICD-10-CM

## 2011-12-19 ENCOUNTER — Ambulatory Visit
Admission: RE | Admit: 2011-12-19 | Discharge: 2011-12-19 | Disposition: A | Discharge status: Home / Self Care | Payer: Medicare Other | Source: Ambulatory Visit | Attending: Neurological Surgery | Admitting: Neurological Surgery

## 2011-12-19 DIAGNOSIS — M47812 Spondylosis without myelopathy or radiculopathy, cervical region: Secondary | ICD-10-CM

## 2012-01-30 ENCOUNTER — Ambulatory Visit (INDEPENDENT_AMBULATORY_CARE_PROVIDER_SITE_OTHER): Payer: Medicare Other | Admitting: Family Medicine

## 2012-01-30 ENCOUNTER — Encounter: Payer: Self-pay | Admitting: Family Medicine

## 2012-01-30 VITALS — BP 120/84 | HR 105 | Temp 98.3°F | Ht 64.0 in | Wt 176.0 lb

## 2012-01-30 DIAGNOSIS — J438 Other emphysema: Secondary | ICD-10-CM

## 2012-01-30 DIAGNOSIS — J439 Emphysema, unspecified: Secondary | ICD-10-CM

## 2012-01-30 DIAGNOSIS — J209 Acute bronchitis, unspecified: Secondary | ICD-10-CM

## 2012-01-30 MED ORDER — AZITHROMYCIN 250 MG PO TABS: ORAL_TABLET | ORAL | Status: DC

## 2012-01-30 MED ORDER — CONJ ESTROG-MEDROXYPROGEST ACE 0.3-1.5 MG PO TABS: 1.0000 | ORAL_TABLET | Freq: Every day | ORAL | Status: DC

## 2012-01-30 MED ORDER — CYCLOBENZAPRINE HCL 10 MG PO TABS: 10.0000 mg | ORAL_TABLET | Freq: Three times a day (TID) | ORAL | Status: DC | PRN

## 2012-01-30 NOTE — Progress Notes (Signed)
Nature conservation officer at [facility] [address] [place] [address] Phone: [phone] Fax: [phone]  Date:  01/30/2012   Name:  Lindsay Morse   DOB:  05/04/1955   MRN:  [id number] Gender: female Age: 56 y.o.  PCP:  Hannah Beat, MD  Evaluating MD: Hannah Beat, MD   Chief Complaint: Cough   History of Present Illness:  Lindsay Morse is a 56 y.o. pleasant patient who presents with the following:  Started about  aweek ago, had some swollen glands and then the first of the week. Clear to brown. No temp.  Chills at night. Having some severe night sweats. Waking up and taking a shower.   Acute Bronchitis: Patient presents for presents evaluation of productive cough, productive cough with sputum described as yellow, green and brown, sneezing, sore throat and wheezing. Symptoms began 7 days ago and are gradually worsening since that time.  Past history is significant for COPD and emphysema.  Sweating and menopause sx not as good effect from meds as before  Patient Active Problem List  Diagnosis  . HYPERLIPIDEMIA  . Depression  . TOBACCO ABUSE  . IBS  . FIBROMYALGIA  . SLEEP DISORDER  . COLONIC POLYPS, HX OF  . COPD with emphysema    Past Medical History  Diagnosis Date  . OA (osteoarthritis)     hips, back  . Irritable bowel syndrome   . HTN (hypertension)     now off all meds  . Fibromyalgia   . Depression   . Hyperlipidemia   . Suicidal behavior     >20 years ago  . COPD (chronic obstructive pulmonary disease)   . Personal history of colonic polyps     Past Surgical History  Procedure Date  . Carpal tunnel release 2007    right  . Cholecystectomy     Dr. Clydie Braun  . Colonoscopy 2008    repeat every 5 years,polyps- [place], [place] (03/29/2005-Hyperplastic polyp)  . Upper gastrointestinal endoscopy 6/11    duodenitis,maloney dilated    History  Substance Use Topics  . Smoking status: Current Every Day Smoker -- 70 years    Types: Cigarettes    . Smokeless tobacco: Not on file     Comment: ~ 6 cigarettes/day  . Alcohol Use: No    Family History  Problem Relation Age of Onset  . Hypertension Mother   . Uterine cancer Mother     ischemic bowel; back pain, borderline DM  . Coronary artery disease Father   . Heart attack Father   . Coronary artery disease Sister     first MI at 9, 3 cardiac procedures  . Heart attack Sister   . Crohn's disease Sister   . Colon cancer Maternal Aunt   . Colon cancer Maternal Grandfather   . Alcohol abuse Father   . Thyroid disease Sister     dysfunction    Allergies  Allergen Reactions  . Regadenoson Other (See Comments)    Trouble breathing  . Milnacipran     REACTION: tachycardia    Medication list has been reviewed and updated.  Outpatient Prescriptions Prior to Visit  Medication Sig Dispense Refill  . cyclobenzaprine (FLEXERIL) 10 MG tablet Take 10 mg by mouth 3 (three) times daily as needed.      . diclofenac sodium (VOLTAREN) 1 % GEL Apply 1 application topically 4 (four) times daily. Apply as directed 4 times daily as needed(3 month supply)  15 Tube  3  . estradiol-norethindrone (ACTIVELLA)  1-0.5 MG per tablet Take 1 tablet by mouth daily.  90 tablet  3  . lidocaine (LIDODERM) 5 % Place 2 patches onto the skin daily. Remove & Discard patch within 12 hours or as directed by MD  60 patch  11  . Multiple Vitamins-Minerals (CENTRUM SILVER PO) Take by mouth daily.        . pantoprazole (PROTONIX) 40 MG tablet Take 1 tablet (40 mg total) by mouth daily.  90 tablet  3  . tiotropium (SPIRIVA HANDIHALER) 18 MCG inhalation capsule Place 1 capsule (18 mcg total) into inhaler and inhale daily.  90 capsule  3   Last reviewed on 01/30/2012 10:28 AM by Consuello Masse, CMA  Review of Systems:  ROS: GEN: Acute illness details above GI: Tolerating PO intake GU: maintaining adequate hydration and urination Pulm: No SOB Interactive and getting along well at home.  Otherwise, ROS is  as per the HPI.   Physical Examination: Filed Vitals:   01/30/12 1028  BP: 120/84  Pulse: 105  Temp: 98.3 F (36.8 C)  TempSrc: Oral  Height: 5\' 4"  (1.626 m)  Weight: 176 lb (79.833 kg)  SpO2: 92%    Body mass index is 30.21 kg/(m^2). Ideal Body Weight: Weight in (lb) to have BMI = 25: 145.3    GEN: A and O x 3. WDWN. NAD.    ENT: Nose clear, ext NML.  No LAD.  No JVD.  TM's clear. Oropharynx clear.  PULM: Normal WOB, no distress. No crackles, wheezes, rhonchi. CV: RRR, no M/G/R, No rubs, No JVD.   EXT: warm and well-perfused, No c/c/e. PSYCH: Pleasant and conversant.   Assessment and Plan:  1. Bronchitis, acute   2. COPD with emphysema    zpak Refill flex  Change hormones - trial of prempro, may need to titrate dose to effect, will call in 1 mo if not getting better.   Orders Today:  No orders of the defined types were placed in this encounter.    Updated Medication List: (Includes new medications, updates to list, dose adjustments) Meds ordered this encounter  Medications  . azithromycin (ZITHROMAX) 250 MG tablet    Sig: 2 tabs po on day 1, then 1 tab po for 4 days    Dispense:  6 tablet    Refill:  0  . cyclobenzaprine (FLEXERIL) 10 MG tablet    Sig: Take 1 tablet (10 mg total) by mouth 3 (three) times daily as needed.    Dispense:  50 tablet    Refill:  11  . estrogen, conjugated,-medroxyprogesterone (PREMPRO) 0.3-1.5 MG per tablet    Sig: Take 1 tablet by mouth daily.    Dispense:  30 tablet    Refill:  5    Medications Discontinued: Medications Discontinued During This Encounter  Medication Reason  . cyclobenzaprine (FLEXERIL) 10 MG tablet Reorder  . estradiol-norethindrone (ACTIVELLA) 1-0.5 MG per tablet      Hannah Beat, MD

## 2012-02-03 ENCOUNTER — Ambulatory Visit (INDEPENDENT_AMBULATORY_CARE_PROVIDER_SITE_OTHER): Payer: Medicare Other | Admitting: Internal Medicine

## 2012-02-03 ENCOUNTER — Encounter: Payer: Self-pay | Admitting: Internal Medicine

## 2012-02-03 VITALS — BP 120/78 | HR 101 | Temp 98.6°F | Resp 16 | Wt 176.0 lb

## 2012-02-03 DIAGNOSIS — F17200 Nicotine dependence, unspecified, uncomplicated: Secondary | ICD-10-CM

## 2012-02-03 DIAGNOSIS — J209 Acute bronchitis, unspecified: Secondary | ICD-10-CM

## 2012-02-03 DIAGNOSIS — J441 Chronic obstructive pulmonary disease with (acute) exacerbation: Secondary | ICD-10-CM | POA: Insufficient documentation

## 2012-02-03 MED ORDER — LEVOFLOXACIN 500 MG PO TABS: 500.0000 mg | ORAL_TABLET | Freq: Every day | ORAL | Status: DC

## 2012-02-03 MED ORDER — PREDNISONE 20 MG PO TABS: 40.0000 mg | ORAL_TABLET | Freq: Every day | ORAL | Status: DC

## 2012-02-03 MED ORDER — ALBUTEROL SULFATE HFA 108 (90 BASE) MCG/ACT IN AERS: 2.0000 | INHALATION_SPRAY | Freq: Four times a day (QID) | RESPIRATORY_TRACT | Status: DC | PRN

## 2012-02-03 NOTE — Assessment & Plan Note (Signed)
Still producing yellow sputum Clinically worse  No signs of pneumonia Will start levofloxacin

## 2012-02-03 NOTE — Progress Notes (Signed)
Subjective:    Patient ID: Lindsay Morse, female    DOB: 02/14/1956, 56 y.o.   MRN: [id number]  HPI "I'm miserable"  Feels like "I have an elephant on my chest blocking my breathing" Throat feels constricted and trouble with breathing Cough productive of yellow sputum---on last day of z-pak  No fever No sig head congestion or rhinorrhea No sore throat or ear pain Some headaches  Has been using spiriva Using mucinex--may help loosening up her mucus  Current Outpatient Prescriptions on File Prior to Visit  Medication Sig Dispense Refill  . cyclobenzaprine (FLEXERIL) 10 MG tablet Take 1 tablet (10 mg total) by mouth 3 (three) times daily as needed.  50 tablet  11  . estrogen, conjugated,-medroxyprogesterone (PREMPRO) 0.3-1.5 MG per tablet Take 1 tablet by mouth daily.  30 tablet  5  . lidocaine (LIDODERM) 5 % Place 2 patches onto the skin daily. Remove & Discard patch within 12 hours or as directed by MD  60 patch  11  . Multiple Vitamins-Minerals (CENTRUM SILVER PO) Take by mouth daily.        . pantoprazole (PROTONIX) 40 MG tablet Take 1 tablet (40 mg total) by mouth daily.  90 tablet  3  . tiotropium (SPIRIVA HANDIHALER) 18 MCG inhalation capsule Place 1 capsule (18 mcg total) into inhaler and inhale daily.  90 capsule  3  . [DISCONTINUED] atorvastatin (LIPITOR) 40 MG tablet Take 40 mg by mouth daily.          Allergies  Allergen Reactions  . Regadenoson Other (See Comments)    Trouble breathing  . Milnacipran     REACTION: tachycardia    Past Medical History  Diagnosis Date  . OA (osteoarthritis)     hips, back  . Irritable bowel syndrome   . HTN (hypertension)     now off all meds  . Fibromyalgia   . Depression   . Hyperlipidemia   . Suicidal behavior     >20 years ago  . COPD (chronic obstructive pulmonary disease)   . Personal history of colonic polyps     Past Surgical History  Procedure Date  . Carpal tunnel release 2007    right  . Cholecystectomy    Dr. Clydie Braun  . Colonoscopy 2008    repeat every 5 years,polyps- [place], [place] (03/29/2005-Hyperplastic polyp)  . Upper gastrointestinal endoscopy 6/11    duodenitis,maloney dilated    Family History  Problem Relation Age of Onset  . Hypertension Mother   . Uterine cancer Mother     ischemic bowel; back pain, borderline DM  . Coronary artery disease Father   . Heart attack Father   . Coronary artery disease Sister     first MI at 67, 3 cardiac procedures  . Heart attack Sister   . Crohn's disease Sister   . Colon cancer Maternal Aunt   . Colon cancer Maternal Grandfather   . Alcohol abuse Father   . Thyroid disease Sister     dysfunction    History   Social History  . Marital Status: Married    Spouse Name: N/A    Number of Children: N/A  . Years of Education: N/A   Occupational History  . Unemployed     Sales   Social History Main Topics  . Smoking status: Current Every Day Smoker -- 70 years    Types: Cigarettes  . Smokeless tobacco: Not on file     Comment: ~ 6 cigarettes/day  . Alcohol Use: No  .  Drug Use: No  . Sexually Active: Not on file   Other Topics Concern  . Not on file   Social History Narrative   December moved from [place], [place] children 31,34 (step-40)Regular exercise-yes, 30 minutes, twice/week   Review of Systems Slight facial rash No vomiting Slight loose stool 4 days ago Still able to eat okay     Objective:   Physical Exam  Constitutional: She appears well-developed and well-nourished. No distress.       Coarse, tight cough  HENT:  Mouth/Throat: Oropharynx is clear and moist. No oropharyngeal exudate.       No sinus tenderness Mild nasal congestion TMs normal  Neck: Normal range of motion. Neck supple.  Pulmonary/Chest: Effort normal. No respiratory distress. She has no rales.       Mildly decreased breath sounds Tight cough but no sig prolonged exp phase Slight exp wheeze (mostly with cough)  Musculoskeletal: She  exhibits no edema.  Lymphadenopathy:    She has no cervical adenopathy.  Psychiatric: She has a normal mood and affect. Her behavior is normal.          Assessment & Plan:

## 2012-02-03 NOTE — Patient Instructions (Signed)
Please stop all smoking Start the nicotine patch again and use it for at least the next 2 months

## 2012-02-03 NOTE — Assessment & Plan Note (Signed)
Counseled 3-4 minutes She had cut down due to dyspnea Will have her restart the patch and stop all smoking

## 2012-02-03 NOTE — Assessment & Plan Note (Signed)
Clearly is tight and some dyspnea Will treat with prednisone Rescue inhaler Discussed cigarettes Continues on spiriva

## 2012-02-10 ENCOUNTER — Ambulatory Visit (INDEPENDENT_AMBULATORY_CARE_PROVIDER_SITE_OTHER)
Admission: RE | Admit: 2012-02-10 | Discharge: 2012-02-10 | Disposition: A | Discharge status: Home / Self Care | Payer: Medicare Other | Source: Ambulatory Visit | Attending: Family Medicine | Admitting: Family Medicine

## 2012-02-10 ENCOUNTER — Ambulatory Visit (INDEPENDENT_AMBULATORY_CARE_PROVIDER_SITE_OTHER): Payer: Medicare Other | Admitting: Family Medicine

## 2012-02-10 ENCOUNTER — Encounter: Payer: Self-pay | Admitting: Family Medicine

## 2012-02-10 VITALS — BP 130/80 | HR 98 | Temp 98.4°F | Ht 64.0 in | Wt 178.2 lb

## 2012-02-10 DIAGNOSIS — R059 Cough, unspecified: Secondary | ICD-10-CM

## 2012-02-10 DIAGNOSIS — R05 Cough: Secondary | ICD-10-CM

## 2012-02-10 DIAGNOSIS — J441 Chronic obstructive pulmonary disease with (acute) exacerbation: Secondary | ICD-10-CM

## 2012-02-10 MED ORDER — PREDNISONE 20 MG PO TABS: ORAL_TABLET | ORAL | Status: DC

## 2012-02-10 MED ORDER — NYSTATIN 100000 UNIT/ML MT SUSP: 500000.0000 [IU] | Freq: Four times a day (QID) | OROMUCOSAL | Status: DC

## 2012-02-10 MED ORDER — HYDROCODONE-HOMATROPINE 5-1.5 MG/5ML PO SYRP: ORAL_SOLUTION | ORAL | Status: DC

## 2012-02-10 NOTE — Progress Notes (Signed)
Nature conservation officer at [facility] [address] [place] [address] Phone: [phone] Fax: [phone]  Date:  02/10/2012   Name:  Lindsay Morse   DOB:  1955-07-29   MRN:  [id number] Gender: female Age: 56 y.o.  PCP:  Hannah Beat, MD  Evaluating MD: Hannah Beat, MD   Chief Complaint: Cough and Thrush   History of Present Illness:  Lindsay Morse is a 56 y.o. pleasant patient who presents with the following:  3 weeks now cough with production - sickest in 15 years.  Coughing productive sputum all night. Coughing all the time and feels terrible. She has not been able to quit smoking.   Initially placed on Zpak Then on reeval Dr. Alphonsus Sias placed on prednisone and Levaquin  Now, with continued sx. Unsure if prednisone or inhalers are making her feel better.   Patient Active Problem List  Diagnosis  . HYPERLIPIDEMIA  . Depression  . TOBACCO ABUSE  . IBS  . FIBROMYALGIA  . SLEEP DISORDER  . COLONIC POLYPS, HX OF  . COPD with emphysema    Past Medical History  Diagnosis Date  . OA (osteoarthritis)     hips, back  . Irritable bowel syndrome   . HTN (hypertension)     now off all meds  . Fibromyalgia   . Depression   . Hyperlipidemia   . Suicidal behavior     >20 years ago  . COPD (chronic obstructive pulmonary disease)   . Personal history of colonic polyps     Past Surgical History  Procedure Date  . Carpal tunnel release 2007    right  . Cholecystectomy     Dr. Clydie Braun  . Colonoscopy 2008    repeat every 5 years,polyps- [place], [place] (03/29/2005-Hyperplastic polyp)  . Upper gastrointestinal endoscopy 6/11    duodenitis,maloney dilated    History  Substance Use Topics  . Smoking status: Current Every Day Smoker -- 70 years    Types: Cigarettes  . Smokeless tobacco: Not on file     Comment: ~ 6 cigarettes/day  . Alcohol Use: No    Family History  Problem Relation Age of Onset  . Hypertension Mother   . Uterine cancer Mother    ischemic bowel; back pain, borderline DM  . Coronary artery disease Father   . Heart attack Father   . Coronary artery disease Sister     first MI at 47, 3 cardiac procedures  . Heart attack Sister   . Crohn's disease Sister   . Colon cancer Maternal Aunt   . Colon cancer Maternal Grandfather   . Alcohol abuse Father   . Thyroid disease Sister     dysfunction    Allergies  Allergen Reactions  . Regadenoson Other (See Comments)    Trouble breathing  . Milnacipran     REACTION: tachycardia    Medication list has been reviewed and updated.  Outpatient Prescriptions Prior to Visit  Medication Sig Dispense Refill  . albuterol (PROVENTIL HFA;VENTOLIN HFA) 108 (90 BASE) MCG/ACT inhaler Inhale 2 puffs into the lungs every 6 (six) hours as needed for wheezing.  1 Inhaler  2  . cyclobenzaprine (FLEXERIL) 10 MG tablet Take 1 tablet (10 mg total) by mouth 3 (three) times daily as needed.  50 tablet  11  . diclofenac sodium (VOLTAREN) 1 % GEL Apply 1 application topically 4 (four) times daily.      Marland Kitchen estrogen, conjugated,-medroxyprogesterone (PREMPRO) 0.3-1.5 MG per tablet Take 1 tablet by mouth daily.  30 tablet  5  . levofloxacin (LEVAQUIN) 500 MG tablet Take 1 tablet (500 mg total) by mouth daily.  10 tablet  0  . lidocaine (LIDODERM) 5 % Place 2 patches onto the skin daily. Remove & Discard patch within 12 hours or as directed by MD  60 patch  11  . Multiple Vitamins-Minerals (CENTRUM SILVER PO) Take by mouth daily.        . pantoprazole (PROTONIX) 40 MG tablet Take 1 tablet (40 mg total) by mouth daily.  90 tablet  3  . predniSONE (DELTASONE) 20 MG tablet Take 2 tablets (40 mg total) by mouth daily.  15 tablet  0  . tiotropium (SPIRIVA HANDIHALER) 18 MCG inhalation capsule Place 1 capsule (18 mcg total) into inhaler and inhale daily.  90 capsule  3   Last reviewed on 02/10/2012 11:15 AM by Consuello Masse, CMA  Review of Systems:  ROS: GEN: Acute illness details above GI:  Tolerating PO intake GU: maintaining adequate hydration and urination Pulm: as above  Interactive and getting along well at home.  Otherwise, ROS is as per the HPI.   Physical Examination: Filed Vitals:   02/10/12 1115  BP: 130/80  Pulse: 98  Temp: 98.4 F (36.9 C)  TempSrc: Oral  Height: 5\' 4"  (1.626 m)  Weight: 178 lb 4 oz (80.854 kg)  SpO2: 96%    Body mass index is 30.60 kg/(m^2). Ideal Body Weight: Weight in (lb) to have BMI = 25: 145.3    GEN: A and O x 3. WDWN. NAD.    ENT: Nose clear, ext NML.  No LAD.  No JVD.  TM's clear. Oropharynx clear.  PULM: Normal WOB, no distress. No crackles, decreased BS and scattered rare wheezes. CV: RRR, no M/G/R, No rubs, No JVD.   EXT: warm and well-perfused, No c/c/e. PSYCH: Pleasant and conversant.   Assessment and Plan:  1. COPD exacerbation  DG Chest 2 View  2. Cough  DG Chest 2 View   Most likely mostly from COPD CXR clear Extend prednisone  CXR, AP and Lateral Indication: Cough, shortness or breath: Findings: No focal infiltrate, there is some increased peribronchial markings  Orders Today:  Orders Placed This Encounter  Procedures  . DG Chest 2 View    Standing Status: Future     Number of Occurrences: 1     Standing Expiration Date: 04/11/2013    Order Specific Question:  Preferred imaging location?    Answer:  [facility]    Order Specific Question:  Reason for exam:    Answer:  severe cough    Updated Medication List: (Includes new medications, updates to list, dose adjustments) Meds ordered this encounter  Medications  . predniSONE (DELTASONE) 20 MG tablet    Sig: Take 2 tablets po for 3 days, then 1 tablet for 5 days    Dispense:  11 tablet    Refill:  0  . HYDROcodone-homatropine (HYCODAN) 5-1.5 MG/5ML syrup    Sig: 1 tsp po at night before bed prn cough    Dispense:  240 mL    Refill:  0  . nystatin (MYCOSTATIN) 100000 UNIT/ML suspension    Sig: Take 5 mLs (500,000 Units total) by  mouth 4 (four) times daily.    Dispense:  240 mL    Refill:  0    Medications Discontinued: There are no discontinued medications.   Hannah Beat, MD

## 2012-02-26 ENCOUNTER — Encounter: Payer: Self-pay | Admitting: Family Medicine

## 2012-02-26 ENCOUNTER — Ambulatory Visit (INDEPENDENT_AMBULATORY_CARE_PROVIDER_SITE_OTHER): Payer: Medicare Other | Admitting: Family Medicine

## 2012-02-26 VITALS — BP 130/84 | HR 106 | Temp 97.8°F | Ht 64.0 in | Wt 182.8 lb

## 2012-02-26 DIAGNOSIS — J449 Chronic obstructive pulmonary disease, unspecified: Secondary | ICD-10-CM

## 2012-02-26 DIAGNOSIS — R252 Cramp and spasm: Secondary | ICD-10-CM

## 2012-02-26 DIAGNOSIS — J159 Unspecified bacterial pneumonia: Secondary | ICD-10-CM

## 2012-02-26 LAB — BASIC METABOLIC PANEL
BUN: 16 mg/dL (ref 6–23)
CO2: 27 mEq/L (ref 19–32)
Chloride: 103 mEq/L (ref 96–112)
Potassium: 4.6 mEq/L (ref 3.5–5.1)

## 2012-02-26 MED ORDER — BUDESONIDE-FORMOTEROL FUMARATE 160-4.5 MCG/ACT IN AERO: 2.0000 | INHALATION_SPRAY | Freq: Two times a day (BID) | RESPIRATORY_TRACT | Status: DC

## 2012-02-26 MED ORDER — AMOXICILLIN-POT CLAVULANATE 875-125 MG PO TABS: 1.0000 | ORAL_TABLET | Freq: Two times a day (BID) | ORAL | Status: DC

## 2012-02-26 MED ORDER — AMITRIPTYLINE HCL 25 MG PO TABS: 25.0000 mg | ORAL_TABLET | Freq: Every day | ORAL | Status: DC

## 2012-02-26 NOTE — Progress Notes (Signed)
Nature conservation officer at [facility] [address] [place] [address] Phone: [phone] Fax: [phone]  Date:  02/26/2012   Name:  Lindsay Morse   DOB:  Mar 16, 1956   MRN:  [id number] Gender: female Age: 57 y.o.  PCP:  Hannah Beat, MD  Evaluating MD: Hannah Beat, MD   Chief Complaint: Cough and Fatigue   History of Present Illness:  Lindsay Morse is a 56 y.o. pleasant patient who presents with the following:  4th OV for prolonged cough in 1 month: baseline COPD  Has not gotten better at all. Feels worse.  Terrible cramps all over, all over legs and chest. Coughing -  Still smoking 1/2 PPD. 5 weeks now.  Failure 1 round of azithro, then LVQ 2 rounds of steroids.  Now worse wheezing, cough, production of sputum, some SOB. Has been using spiriva and albuterol. Smoking 1/2 PPD still.  Now also having significant leg cramps - lower legs all the way up.  Pulse ox 97%  Patient Active Problem List  Diagnosis  . HYPERLIPIDEMIA  . Depression  . TOBACCO ABUSE  . IBS  . FIBROMYALGIA  . SLEEP DISORDER  . COLONIC POLYPS, HX OF  . COPD with emphysema    Past Medical History  Diagnosis Date  . OA (osteoarthritis)     hips, back  . Irritable bowel syndrome   . HTN (hypertension)     now off all meds  . Fibromyalgia   . Depression   . Hyperlipidemia   . Suicidal behavior     >20 years ago  . COPD (chronic obstructive pulmonary disease)   . Personal history of colonic polyps     Past Surgical History  Procedure Date  . Carpal tunnel release 2007    right  . Cholecystectomy     Dr. Clydie Braun  . Colonoscopy 2008    repeat every 5 years,polyps- [place], [place] (03/29/2005-Hyperplastic polyp)  . Upper gastrointestinal endoscopy 6/11    duodenitis,maloney dilated    History  Substance Use Topics  . Smoking status: Current Every Day Smoker -- 70 years    Types: Cigarettes  . Smokeless tobacco: Not on file     Comment: ~ 6 cigarettes/day  . Alcohol  Use: No    Family History  Problem Relation Age of Onset  . Hypertension Mother   . Uterine cancer Mother     ischemic bowel; back pain, borderline DM  . Coronary artery disease Father   . Heart attack Father   . Coronary artery disease Sister     first MI at 48, 3 cardiac procedures  . Heart attack Sister   . Crohn's disease Sister   . Colon cancer Maternal Aunt   . Colon cancer Maternal Grandfather   . Alcohol abuse Father   . Thyroid disease Sister     dysfunction    Allergies  Allergen Reactions  . Regadenoson Other (See Comments)    Trouble breathing  . Milnacipran     REACTION: tachycardia    Medication list has been reviewed and updated.  Outpatient Prescriptions Prior to Visit  Medication Sig Dispense Refill  . albuterol (PROVENTIL HFA;VENTOLIN HFA) 108 (90 BASE) MCG/ACT inhaler Inhale 2 puffs into the lungs every 6 (six) hours as needed for wheezing.  1 Inhaler  2  . cyclobenzaprine (FLEXERIL) 10 MG tablet Take 1 tablet (10 mg total) by mouth 3 (three) times daily as needed.  50 tablet  11  . diclofenac sodium (VOLTAREN) 1 % GEL  Apply 1 application topically 4 (four) times daily.      Marland Kitchen estrogen, conjugated,-medroxyprogesterone (PREMPRO) 0.3-1.5 MG per tablet Take 1 tablet by mouth daily.  30 tablet  5  . lidocaine (LIDODERM) 5 % Place 2 patches onto the skin daily. Remove & Discard patch within 12 hours or as directed by MD  60 patch  11  . Multiple Vitamins-Minerals (CENTRUM SILVER PO) Take by mouth daily.        . pantoprazole (PROTONIX) 40 MG tablet Take 1 tablet (40 mg total) by mouth daily.  90 tablet  3  . tiotropium (SPIRIVA HANDIHALER) 18 MCG inhalation capsule Place 1 capsule (18 mcg total) into inhaler and inhale daily.  90 capsule  3  . [DISCONTINUED] HYDROcodone-homatropine (HYCODAN) 5-1.5 MG/5ML syrup 1 tsp po at night before bed prn cough  240 mL  0  . [DISCONTINUED] levofloxacin (LEVAQUIN) 500 MG tablet Take 1 tablet (500 mg total) by mouth daily.   10 tablet  0  . [DISCONTINUED] nystatin (MYCOSTATIN) 100000 UNIT/ML suspension Take 5 mLs (500,000 Units total) by mouth 4 (four) times daily.  240 mL  0  . [DISCONTINUED] predniSONE (DELTASONE) 20 MG tablet Take 2 tablets (40 mg total) by mouth daily.  15 tablet  0  . [DISCONTINUED] predniSONE (DELTASONE) 20 MG tablet Take 2 tablets po for 3 days, then 1 tablet for 5 days  11 tablet  0   Last reviewed on 02/26/2012  9:22 AM by Consuello Masse, CMA  Review of Systems:  ROS: GEN: Acute illness details above GI: Tolerating PO intake GU: maintaining adequate hydration and urination Pulm: above Interactive and getting along well at home.  Otherwise, ROS is as per the HPI.   Physical Examination: Filed Vitals:   02/26/12 0849  BP: 130/84  Pulse: 106  Temp: 97.8 F (36.6 C)  TempSrc: Oral  Height: 5\' 4"  (1.626 m)  Weight: 182 lb 12 oz (82.895 kg)  SpO2: 97%    Body mass index is 31.37 kg/(m^2). Ideal Body Weight: Weight in (lb) to have BMI = 25: 145.3    GEN: A and O x 3. WDWN. NAD.    ENT: Nose clear, ext NML.  No LAD.  No JVD.  TM's clear. Oropharynx clear.  PULM: Normal WOB, no distress. Diffuse wheezing, rhonchi more on the R side CV: RRR, no M/G/R, No rubs, No JVD.   EXT: warm and well-perfused, No c/c/e. PSYCH: Pleasant and conversant.   Assessment and Plan:  1. Pneumonia, bacterial  amoxicillin-clavulanate (AUGMENTIN) 875-125 MG per tablet  2. Cramps, extremity  Basic metabolic panel  3. COPD (chronic obstructive pulmonary disease)  budesonide-formoterol (SYMBICORT) 160-4.5 MCG/ACT inhaler, Ambulatory referral to Pulmonology   Augmentin, add symbicort - taught in the office. Check BMP for cramps  Sciatica, discussed -- for neuro impingement, trial of elavil, which could help her with sleep, too.  The patient has frequent OV for pulm c/o, and I wanted to get pulmonary opinion to see if they had recommendations for long-term management.  Orders Today:  Orders  Placed This Encounter  Procedures  . Basic metabolic panel  . Ambulatory referral to Pulmonology    Referral Priority:  Routine    Referral Type:  Consultation    Referral Reason:  Specialty Services Required    Requested Specialty:  Pulmonary Disease    Number of Visits Requested:  1    Updated Medication List: (Includes new medications, updates to list, dose adjustments) Meds ordered this encounter  Medications  . budesonide-formoterol (SYMBICORT) 160-4.5 MCG/ACT inhaler    Sig: Inhale 2 puffs into the lungs 2 (two) times daily.    Dispense:  1 Inhaler    Refill:  3  . amoxicillin-clavulanate (AUGMENTIN) 875-125 MG per tablet    Sig: Take 1 tablet by mouth 2 (two) times daily.    Dispense:  20 tablet    Refill:  0  . amitriptyline (ELAVIL) 25 MG tablet    Sig: Take 1 tablet (25 mg total) by mouth at bedtime.    Dispense:  30 tablet    Refill:  2    Medications Discontinued: Medications Discontinued During This Encounter  Medication Reason  . HYDROcodone-homatropine (HYCODAN) 5-1.5 MG/5ML syrup Error  . predniSONE (DELTASONE) 20 MG tablet Error  . predniSONE (DELTASONE) 20 MG tablet Error  . nystatin (MYCOSTATIN) 100000 UNIT/ML suspension Error  . levofloxacin (LEVAQUIN) 500 MG tablet Error     Hannah Beat, MD

## 2012-02-26 NOTE — Patient Instructions (Addendum)
REFERRAL: GO THE THE FRONT ROOM AT THE ENTRANCE OF OUR CLINIC, NEAR CHECK IN. ASK FOR MARION. SHE WILL HELP YOU SET UP YOUR REFERRAL. DATE: TIME:  

## 2012-02-27 ENCOUNTER — Encounter: Payer: Self-pay | Admitting: *Deleted

## 2012-03-02 ENCOUNTER — Other Ambulatory Visit: Payer: Self-pay

## 2012-03-02 MED ORDER — CONJ ESTROG-MEDROXYPROGEST ACE 0.3-1.5 MG PO TABS: 1.0000 | ORAL_TABLET | Freq: Every day | ORAL | Status: DC

## 2012-03-02 NOTE — Telephone Encounter (Signed)
Pt request 90 day written rx for mail order pharmacy for Prempro. Call pt when ready for pick up. (pt also requested recent lab results;Patient notified as instructed by telephone and advised would receive letter in mail.)

## 2012-03-02 NOTE — Telephone Encounter (Signed)
done

## 2012-03-24 ENCOUNTER — Ambulatory Visit (INDEPENDENT_AMBULATORY_CARE_PROVIDER_SITE_OTHER): Payer: Medicare Other | Admitting: Pulmonary Disease

## 2012-03-24 ENCOUNTER — Encounter: Payer: Self-pay | Admitting: Pulmonary Disease

## 2012-03-24 VITALS — BP 142/84 | HR 70 | Temp 98.0°F | Ht 64.0 in | Wt 184.0 lb

## 2012-03-24 DIAGNOSIS — R042 Hemoptysis: Secondary | ICD-10-CM

## 2012-03-24 DIAGNOSIS — R05 Cough: Secondary | ICD-10-CM

## 2012-03-24 DIAGNOSIS — F17200 Nicotine dependence, unspecified, uncomplicated: Secondary | ICD-10-CM

## 2012-03-24 DIAGNOSIS — J439 Emphysema, unspecified: Secondary | ICD-10-CM

## 2012-03-24 DIAGNOSIS — R059 Cough, unspecified: Secondary | ICD-10-CM

## 2012-03-24 DIAGNOSIS — J438 Other emphysema: Secondary | ICD-10-CM

## 2012-03-24 LAB — POCT INFLUENZA A/B
Influenza A, POC: NEGATIVE
Influenza B, POC: NEGATIVE

## 2012-03-24 MED ORDER — NICOTINE 10 MG IN INHA: 1.0000 | RESPIRATORY_TRACT | Status: DC | PRN

## 2012-03-24 MED ORDER — HYDROCOD POLST-CHLORPHEN POLST 10-8 MG/5ML PO LQCR: 5.0000 mL | Freq: Every evening | ORAL | Status: DC | PRN

## 2012-03-24 NOTE — Assessment & Plan Note (Signed)
I question this diagnosis because her simple spirometry today showed no obstruction.  She has chronic bronchitis based on her recurrent cough and phlegm, but this does not mean that she has COPD. While she may benefit from her inhalers, there is no clear indication for both at this point.  Because she has struggled so much lately I don't want to back off on her regimen, but she could likely take either the Spiriva or Symbicort alone.  We'll sort that out on the next visit.  Plan: I'd like for her to have full PFT's at [facility]. Continue symbicort and spiriva for now

## 2012-03-24 NOTE — Assessment & Plan Note (Addendum)
Given the duration of her symptoms, I worry that this is a prolonged exacerbation of chronic bronchitis vs an atypical infection such as pertussis.  Given all the antibiotics she has seen lately, I will hold off on adding more at this point.  She is reluctant to try more steroids.   Other diagnostic considerations are allergic reactions such as ABPA (though her lack of obstruction on PFT's argue against this) vs sinus disease (she has nearly none) vs reflux.  Our care at this point needs to focus on breaking her cough cycle. She may have some component of postnasal drip that is contributing.  Overall getting her to stop coughing is going to be very difficult considering her ongoing tobacco use.  Plan: -QUIT SMOKING! -tussionex qPM -delsym, typical sinus-cough regimen during the day (chlorpheniramine, pseudophed, delsym) -CXR -pertussis Ab's -Flu test -f/u 2 weeks

## 2012-03-24 NOTE — Assessment & Plan Note (Signed)
Discussed at length today.  She would prefer to use the Nicotrol inhaler.

## 2012-03-24 NOTE — Patient Instructions (Addendum)
We will call you with the results of your flu and pertussis tests  We will have you get a chest x-ray at the [facility] office before you go Quit Smoking! Use the nicotrol inhaler to help quit smoking Keep using your inhalers as you are doing Use the tussionex as needed at night to help with the cough; DO NOT take this and drive as it will make you drowsy During the day, you can use delsym, chlorpheniramine, and an over the counter decongestant (pseudophed or phenylephrine) for the cough  We will see you back in 2-3 weeks or sooner if needed

## 2012-03-24 NOTE — Progress Notes (Signed)
Subjective:    Patient ID: Lindsay Morse, female    DOB: 04/02/1955, 57 y.o.   MRN: [id number]  HPI  Date is a 57 year old female referred to [place] for evaluation of ongoing cough and presumptive COPD. She has smoked one to 2 packs of cigarettes daily since she was a teenager and currently smokes one pack a day. She's tried to quit smoking several times was never been able to quit. In November of 2013 she developed a cough and has since been to her primary care physician at least 4 times. During that time she's been treated with azithromycin, Levaquin, Augmentin, and prednisone.  She was initially treated with azithromycin. She has not improved so she was treated with prednisone and Levaquin. Her symptoms still persisted so Symbicort was added in addition to Augmentin for presumptive diagnosis of pneumonia. This all occurred over the course of several weeks from mid-November through the beginning of December. According to office notes on at least one occasion she was noted to be wheezing and "tight". She says that she felt like the Symbicort improved her symptoms somewhat. She tells me that her cough persists but has improved somewhat in the last couple weeks. She still has some wheezing and some shortness of breath but this also has improved. When her symptoms were the worst she did note some blood in her sputum on occasion. In the last several weeks she has not had fevers, chills, or chest pain. She states that it least once a year she gets an episode of bronchitis but this is clearly been the worst that she's ever had. She did not receive the flu shot this year. She had been treated with Spiriva since about 2011, but she says she only use this on an as needed basis. She stated that when she used it it helped quite a bit. She denies significant sinus symptoms or acid reflux.   Past Medical History  Diagnosis Date  . OA (osteoarthritis)     hips, back  . Irritable bowel syndrome   . HTN (hypertension)    now off all meds  . Fibromyalgia   . Depression   . Hyperlipidemia   . Suicidal behavior     >20 years ago  . COPD (chronic obstructive pulmonary disease)   . Personal history of colonic polyps      Family History  Problem Relation Age of Onset  . Hypertension Mother   . Uterine cancer Mother     ischemic bowel; back pain, borderline DM  . Coronary artery disease Father   . Heart attack Father   . Coronary artery disease Sister     first MI at 50, 3 cardiac procedures  . Heart attack Sister   . Crohn's disease Sister   . Colon cancer Maternal Aunt   . Colon cancer Maternal Grandfather   . Alcohol abuse Father   . Thyroid disease Sister     dysfunction  . Asthma Mother      History   Social History  . Marital Status: Married    Spouse Name: N/A    Number of Children: N/A  . Years of Education: N/A   Occupational History  . Unemployed     Sales   Social History Main Topics  . Smoking status: Current Every Day Smoker -- 1.0 packs/day for 43 years    Types: Cigarettes  . Smokeless tobacco: Never Used  . Alcohol Use: No  . Drug Use: No  . Sexually Active: Not on file  Other Topics Concern  . Not on file   Social History Narrative   December moved from [place], [place] children 31,34 (step-40)Regular exercise-yes, 30 minutes, twice/week     Allergies  Allergen Reactions  . Regadenoson Other (See Comments)    Trouble breathing  . Milnacipran     REACTION: tachycardia     Outpatient Prescriptions Prior to Visit  Medication Sig Dispense Refill  . albuterol (PROVENTIL HFA;VENTOLIN HFA) 108 (90 BASE) MCG/ACT inhaler Inhale 2 puffs into the lungs every 6 (six) hours as needed for wheezing.  1 Inhaler  2  . amitriptyline (ELAVIL) 25 MG tablet Take 1 tablet (25 mg total) by mouth at bedtime.  30 tablet  2  . budesonide-formoterol (SYMBICORT) 160-4.5 MCG/ACT inhaler Inhale 2 puffs into the lungs 2 (two) times daily.  1 Inhaler  3  . cyclobenzaprine  (FLEXERIL) 10 MG tablet Take 1 tablet (10 mg total) by mouth 3 (three) times daily as needed.  50 tablet  11  . diclofenac sodium (VOLTAREN) 1 % GEL Apply 1 application topically 4 (four) times daily as needed.       Marland Kitchen estrogen, conjugated,-medroxyprogesterone (PREMPRO) 0.3-1.5 MG per tablet Take 1 tablet by mouth daily.  90 tablet  3  . lidocaine (LIDODERM) 5 % Place 2 patches onto the skin daily. Remove & Discard patch within 12 hours or as directed by MD  60 patch  11  . Multiple Vitamins-Minerals (CENTRUM SILVER PO) Take by mouth daily.        . pantoprazole (PROTONIX) 40 MG tablet Take 1 tablet (40 mg total) by mouth daily.  90 tablet  3  . tiotropium (SPIRIVA HANDIHALER) 18 MCG inhalation capsule Place 1 capsule (18 mcg total) into inhaler and inhale daily.  90 capsule  3  . [DISCONTINUED] amoxicillin-clavulanate (AUGMENTIN) 875-125 MG per tablet Take 1 tablet by mouth 2 (two) times daily.  20 tablet  0   Last reviewed on 03/24/2012  3:17 PM by Christen Butter, CMA    Review of Systems  Constitutional: Negative for fever, chills and unexpected weight change.  HENT: Positive for congestion, rhinorrhea and postnasal drip. Negative for ear pain, nosebleeds, sore throat, sneezing, trouble swallowing, dental problem, voice change and sinus pressure.   Eyes: Negative for visual disturbance.  Respiratory: Positive for cough. Negative for choking and shortness of breath.   Cardiovascular: Positive for leg swelling. Negative for chest pain.  Gastrointestinal: Negative for vomiting, abdominal pain and diarrhea.  Genitourinary: Negative for difficulty urinating.  Musculoskeletal: Positive for arthralgias.  Skin: Positive for rash.  Neurological: Positive for headaches. Negative for tremors and syncope.  Hematological: Does not bruise/bleed easily.       Objective:   Physical Exam Filed Vitals:   03/24/12 1519  BP: 142/84  Pulse: 70  Temp: 98 F (36.7 C)  TempSrc: Oral  Height: 5\' 4"   (1.626 m)  Weight: 184 lb (83.462 kg)  SpO2: 96%    Gen: well appearing, no acute distress HEENT: NCAT, PERRL, EOMi, OP clear, neck supple without masses PULM: CTA B CV: RRR, no mgr, no JVD AB: BS+, soft, nontender, no hsm Ext: warm, no edema, no clubbing, no cyanosis Derm: no rash or skin breakdown Neuro: A&Ox4, CN II-XII intact, strength 5/5 in all 4 extremities  03/2012 Simple spirometry: normal 03/2012 IgE 4 IU/mL 03/2012 CXR >> normal cardiac sillhouette, no parenchymal changes; normal inflation, overall normal study     Assessment & Plan:   COPD with emphysema I question  this diagnosis because her simple spirometry today showed no obstruction.  She has chronic bronchitis based on her recurrent cough and phlegm, but this does not mean that she has COPD. While she may benefit from her inhalers, there is no clear indication for both at this point.  Because she has struggled so much lately I don't want to back off on her regimen, but she could likely take either the Spiriva or Symbicort alone.  We'll sort that out on the next visit.  Plan: I'd like for her to have full PFT's at [facility]. Continue symbicort and spiriva for now    Cough Given the duration of her symptoms, I worry that this is a prolonged exacerbation of chronic bronchitis vs an atypical infection such as pertussis.  Given all the antibiotics she has seen lately, I will hold off on adding more at this point.  She is reluctant to try more steroids.   Other diagnostic considerations are allergic reactions such as ABPA (though her lack of obstruction on PFT's argue against this) vs sinus disease (she has nearly none) vs reflux.  Our care at this point needs to focus on breaking her cough cycle. She may have some component of postnasal drip that is contributing.  Overall getting her to stop coughing is going to be very difficult considering her ongoing tobacco use.  Plan: -QUIT SMOKING! -tussionex qPM -delsym, typical  sinus-cough regimen during the day (chlorpheniramine, pseudophed, delsym) -CXR -pertussis Ab's -Flu test -f/u 2 weeks  TOBACCO ABUSE Discussed at length today.  She would prefer to use the Nicotrol inhaler.  Hemoptysis Bronchitis as the most likely etiology of her hemoptysis given its duration and severity. However considering her ongoing and lengthy tobacco use if this symptom does not resolve quickly she will need to have a CT scan and consideration of bronchoscopy.     Updated Medication List Outpatient Encounter Prescriptions as of 03/24/2012  Medication Sig Dispense Refill  . albuterol (PROVENTIL HFA;VENTOLIN HFA) 108 (90 BASE) MCG/ACT inhaler Inhale 2 puffs into the lungs every 6 (six) hours as needed for wheezing.  1 Inhaler  2  . amitriptyline (ELAVIL) 25 MG tablet Take 1 tablet (25 mg total) by mouth at bedtime.  30 tablet  2  . budesonide-formoterol (SYMBICORT) 160-4.5 MCG/ACT inhaler Inhale 2 puffs into the lungs 2 (two) times daily.  1 Inhaler  3  . cyclobenzaprine (FLEXERIL) 10 MG tablet Take 1 tablet (10 mg total) by mouth 3 (three) times daily as needed.  50 tablet  11  . diclofenac sodium (VOLTAREN) 1 % GEL Apply 1 application topically 4 (four) times daily as needed.       Marland Kitchen estrogen, conjugated,-medroxyprogesterone (PREMPRO) 0.3-1.5 MG per tablet Take 1 tablet by mouth daily.  90 tablet  3  . lidocaine (LIDODERM) 5 % Place 2 patches onto the skin daily. Remove & Discard patch within 12 hours or as directed by MD  60 patch  11  . Multiple Vitamins-Minerals (CENTRUM SILVER PO) Take by mouth daily.        . pantoprazole (PROTONIX) 40 MG tablet Take 1 tablet (40 mg total) by mouth daily.  90 tablet  3  . tiotropium (SPIRIVA HANDIHALER) 18 MCG inhalation capsule Place 1 capsule (18 mcg total) into inhaler and inhale daily.  90 capsule  3  . chlorpheniramine-HYDROcodone (TUSSIONEX) 10-8 MG/5ML LQCR Take 5 mLs by mouth at bedtime as needed.  140 mL  0  . nicotine (NICOTROL) 10 MG  inhaler Inhale 1  puff into the lungs as needed for smoking cessation.  42 each  1  . [DISCONTINUED] amoxicillin-clavulanate (AUGMENTIN) 875-125 MG per tablet Take 1 tablet by mouth 2 (two) times daily.  20 tablet  0

## 2012-03-25 ENCOUNTER — Telehealth: Payer: Self-pay | Admitting: *Deleted

## 2012-03-25 ENCOUNTER — Ambulatory Visit (INDEPENDENT_AMBULATORY_CARE_PROVIDER_SITE_OTHER)
Admission: RE | Admit: 2012-03-25 | Discharge: 2012-03-25 | Disposition: A | Discharge status: Home / Self Care | Payer: Medicare Other | Source: Ambulatory Visit | Attending: Pulmonary Disease | Admitting: Pulmonary Disease

## 2012-03-25 ENCOUNTER — Encounter: Payer: Self-pay | Admitting: Pulmonary Disease

## 2012-03-25 ENCOUNTER — Ambulatory Visit: Payer: Medicare Other

## 2012-03-25 DIAGNOSIS — R05 Cough: Secondary | ICD-10-CM

## 2012-03-25 DIAGNOSIS — R042 Hemoptysis: Secondary | ICD-10-CM | POA: Insufficient documentation

## 2012-03-25 DIAGNOSIS — R059 Cough, unspecified: Secondary | ICD-10-CM

## 2012-03-25 LAB — IGE: IgE (Immunoglobulin E), Serum: 5.6 IU/mL (ref 0.0–180.0)

## 2012-03-25 NOTE — Assessment & Plan Note (Signed)
Bronchitis as the most likely etiology of her hemoptysis given its duration and severity. However considering her ongoing and lengthy tobacco use if this symptom does not resolve quickly she will need to have a CT scan and consideration of bronchoscopy.

## 2012-03-25 NOTE — Telephone Encounter (Signed)
Message copied by Christen Butter on Wed Mar 25, 2012 12:10 PM ------      Message from: Lupita Leash      Created: Tue Mar 24, 2012  5:40 PM       L,            Can we send this lady for Full PFT's at [facility]?  She actually did not have evidence of COPD on spirometry, shocking.            Thanks,      B

## 2012-03-25 NOTE — Telephone Encounter (Signed)
Spoke with pt and made aware of recs pre Dr. Kendrick Fries She verbalized understanding Agrees to have the PFT Order was sent to [facility]

## 2012-03-26 LAB — B PERTUSSIS IGG/IGM AB: B pertussis IgG Ab, Quant: 66 U/mL — ABNORMAL HIGH (ref 0–9)

## 2012-03-26 NOTE — Progress Notes (Signed)
Quick Note:  Spoke with pt and notified of results per Dr. Wert. Pt verbalized understanding and denied any questions.  ______ 

## 2012-03-30 ENCOUNTER — Encounter: Payer: Self-pay | Admitting: Family Medicine

## 2012-03-30 ENCOUNTER — Ambulatory Visit (INDEPENDENT_AMBULATORY_CARE_PROVIDER_SITE_OTHER): Payer: Medicare Other | Admitting: Family Medicine

## 2012-03-30 VITALS — BP 150/90 | HR 108 | Temp 98.0°F | Ht 64.0 in | Wt 183.0 lb

## 2012-03-30 DIAGNOSIS — B3781 Candidal esophagitis: Secondary | ICD-10-CM

## 2012-03-30 DIAGNOSIS — Z23 Encounter for immunization: Secondary | ICD-10-CM

## 2012-03-30 DIAGNOSIS — B370 Candidal stomatitis: Secondary | ICD-10-CM

## 2012-03-30 MED ORDER — CONJ ESTROG-MEDROXYPROGEST ACE 0.3-1.5 MG PO TABS: 1.0000 | ORAL_TABLET | Freq: Every day | ORAL | Status: DC

## 2012-03-30 MED ORDER — FLUCONAZOLE 200 MG PO TABS: 200.0000 mg | ORAL_TABLET | Freq: Every day | ORAL | Status: DC

## 2012-03-30 NOTE — Progress Notes (Signed)
Nature conservation officer at [facility] [address] [place] [address] Phone: [phone] Fax: [phone]  Date:  03/30/2012   Name:  Lindsay Morse   DOB:  June 26, 1955   MRN:  [id number] Gender: female Age: 57 y.o.  PCP:  Hannah Beat, MD  Evaluating MD: Hannah Beat, MD   Chief Complaint: Thrush   History of Present Illness:  Lindsay Morse is a 57 y.o. pleasant patient who presents with the following:  Feeling better --- everything eating will hurt throat.  Prior thrush, got a little better in mouth, but has pain in throat. Not rinsing with using symbicort.  Pertussis AB was Positive.  Patient Active Problem List  Diagnosis  . HYPERLIPIDEMIA  . Depression  . TOBACCO ABUSE  . IBS  . FIBROMYALGIA  . SLEEP DISORDER  . COLONIC POLYPS, HX OF  . COPD with emphysema  . Hemoptysis    Past Medical History  Diagnosis Date  . OA (osteoarthritis)     hips, back  . Irritable bowel syndrome   . HTN (hypertension)     now off all meds  . Fibromyalgia   . Depression   . Hyperlipidemia   . Suicidal behavior     >20 years ago  . COPD (chronic obstructive pulmonary disease)   . Personal history of colonic polyps     Past Surgical History  Procedure Date  . Carpal tunnel release 2007    right  . Cholecystectomy     Dr. Clydie Braun  . Colonoscopy 2008    repeat every 5 years,polyps- [place], [place] (03/29/2005-Hyperplastic polyp)  . Upper gastrointestinal endoscopy 6/11    duodenitis,maloney dilated    History  Substance Use Topics  . Smoking status: Current Every Day Smoker -- 1.0 packs/day for 43 years    Types: Cigarettes  . Smokeless tobacco: Never Used  . Alcohol Use: No    Family History  Problem Relation Age of Onset  . Hypertension Mother   . Uterine cancer Mother     ischemic bowel; back pain, borderline DM  . Coronary artery disease Father   . Heart attack Father   . Coronary artery disease Sister     first MI at 53, 3 cardiac procedures  .  Heart attack Sister   . Crohn's disease Sister   . Colon cancer Maternal Aunt   . Colon cancer Maternal Grandfather   . Alcohol abuse Father   . Thyroid disease Sister     dysfunction  . Asthma Mother     Allergies  Allergen Reactions  . Regadenoson Other (See Comments)    Trouble breathing  . Milnacipran     REACTION: tachycardia    Medication list has been reviewed and updated.  Outpatient Prescriptions Prior to Visit  Medication Sig Dispense Refill  . albuterol (PROVENTIL HFA;VENTOLIN HFA) 108 (90 BASE) MCG/ACT inhaler Inhale 2 puffs into the lungs every 6 (six) hours as needed for wheezing.  1 Inhaler  2  . amitriptyline (ELAVIL) 25 MG tablet Take 1 tablet (25 mg total) by mouth at bedtime.  30 tablet  2  . budesonide-formoterol (SYMBICORT) 160-4.5 MCG/ACT inhaler Inhale 2 puffs into the lungs 2 (two) times daily.  1 Inhaler  3  . chlorpheniramine-HYDROcodone (TUSSIONEX) 10-8 MG/5ML LQCR Take 5 mLs by mouth at bedtime as needed.  140 mL  0  . cyclobenzaprine (FLEXERIL) 10 MG tablet Take 1 tablet (10 mg total) by mouth 3 (three) times daily as needed.  50 tablet  11  .  diclofenac sodium (VOLTAREN) 1 % GEL Apply 1 application topically 4 (four) times daily as needed.       Marland Kitchen estrogen, conjugated,-medroxyprogesterone (PREMPRO) 0.3-1.5 MG per tablet Take 1 tablet by mouth daily.  90 tablet  3  . lidocaine (LIDODERM) 5 % Place 2 patches onto the skin daily. Remove & Discard patch within 12 hours or as directed by MD  60 patch  11  . Multiple Vitamins-Minerals (CENTRUM SILVER PO) Take by mouth daily.        . nicotine (NICOTROL) 10 MG inhaler Inhale 1 puff into the lungs as needed for smoking cessation.  42 each  1  . pantoprazole (PROTONIX) 40 MG tablet Take 1 tablet (40 mg total) by mouth daily.  90 tablet  3  . tiotropium (SPIRIVA HANDIHALER) 18 MCG inhalation capsule Place 1 capsule (18 mcg total) into inhaler and inhale daily.  90 capsule  3   Last reviewed on 03/30/2012  3:13 PM  by Consuello Masse, CMA  Review of Systems:  Feeling better now, cough is getting better.   Physical Examination: BP 150/90  Pulse 108  Temp 98 F (36.7 C) (Oral)  Ht 5\' 4"  (1.626 m)  Wt 183 lb (83.008 kg)  BMI 31.41 kg/m2  SpO2 98%  Ideal Body Weight: Weight in (lb) to have BMI = 25: 145.3    GEN: WDWN, NAD, Non-toxic, Alert & Oriented x 3 HEENT: Atraumatic, Normocephalic. Oropharynx clear. Ears and Nose: No external deformity. EXTR: No clubbing/cyanosis/edema NEURO: Normal gait.  PSYCH: Normally interactive. Conversant. Not depressed or anxious appearing.  Calm demeanor.    Assessment and Plan:  1. Thrush of mouth and esophagus    2. Need for prophylactic vaccination and inoculation against influenza  Flu vaccine greater than or equal to 3yo preservative free IM    Diflucan - suspect origin in the throat now, rx x 2 weeks. If still symptomatic, then we can extend.  Orders Today:  Orders Placed This Encounter  Procedures  . Flu vaccine greater than or equal to 3yo preservative free IM    Updated Medication List: (Includes new medications, updates to list, dose adjustments) Meds ordered this encounter  Medications  . fluconazole (DIFLUCAN) 200 MG tablet    Sig: Take 1 tablet (200 mg total) by mouth daily.    Dispense:  14 tablet    Refill:  0  . estrogen, conjugated,-medroxyprogesterone (PREMPRO) 0.3-1.5 MG per tablet    Sig: Take 1 tablet by mouth daily.    Dispense:  90 tablet    Refill:  3    Medications Discontinued: Medications Discontinued During This Encounter  Medication Reason  . estrogen, conjugated,-medroxyprogesterone (PREMPRO) 0.3-1.5 MG per tablet Reorder     Hannah Beat, MD

## 2012-04-02 ENCOUNTER — Ambulatory Visit: Payer: Self-pay | Admitting: Pulmonary Disease

## 2012-04-02 LAB — PULMONARY FUNCTION TEST

## 2012-04-07 ENCOUNTER — Encounter: Payer: Self-pay | Admitting: Pulmonary Disease

## 2012-04-07 ENCOUNTER — Telehealth: Payer: Self-pay | Admitting: *Deleted

## 2012-04-07 DIAGNOSIS — J439 Emphysema, unspecified: Secondary | ICD-10-CM

## 2012-04-07 NOTE — Telephone Encounter (Signed)
Message copied by Christen Butter on Tue Apr 07, 2012  2:43 PM ------      Message from: Max Fickle B      Created: Tue Apr 07, 2012  1:16 PM       L,            Can you let her know that I reviewed her PFT's from [facility] and I want her to undergo a CT chest (ideally within [facility] system, but [facility] OK) to evaluate for emphysema before our next visit.            Thanks,      B

## 2012-04-07 NOTE — Telephone Encounter (Signed)
Spoke with pt and notified of recs per Dr. Kendrick Fries She verbalized understanding and denied any questions/concerns Order was sent to [facility] foe CT Chest

## 2012-04-09 ENCOUNTER — Ambulatory Visit (INDEPENDENT_AMBULATORY_CARE_PROVIDER_SITE_OTHER)
Admission: RE | Admit: 2012-04-09 | Discharge: 2012-04-09 | Disposition: A | Discharge status: Home / Self Care | Payer: Medicare Other | Source: Ambulatory Visit | Attending: Pulmonary Disease | Admitting: Pulmonary Disease

## 2012-04-09 DIAGNOSIS — J439 Emphysema, unspecified: Secondary | ICD-10-CM

## 2012-04-09 DIAGNOSIS — J438 Other emphysema: Secondary | ICD-10-CM

## 2012-04-10 ENCOUNTER — Telehealth: Payer: Self-pay | Admitting: Pulmonary Disease

## 2012-04-10 NOTE — Telephone Encounter (Signed)
Per Dr. Kendrick Fries---- Please let her know that the CT showed emphysema which explains her PFT findings. This is due to smoking and is more reason for her to quit. At this point she should continue her inhalers as written (and other medications) until she sees me next. The radiologist also said that she may have some blockages in her coronary arteries. If she is aware of this then we can wait until the next visit to address it again. However if she didn't know this then she either needs to talk to her primary care doctor about it (Dr. Dallas Schimke) or we can refer her to Dr. Mariah Milling for a stress test. Thanks, Kipp Brood  Patient has already spoken with Dr. Dallas Schimke and was told she had the blockages but NO emphysema. Would like to know why is this? Stress test done 1 yr ago at [facility] they will be sending results to our office  Will forward first half of msg to Dr. Kendrick Fries to address.  Also, patient still has cough. Is planning to travel this weekend and be around children, would like to know if she is past the "contamination" stage. (regarding her pertussis lab results) Notes Recorded by Lupita Leash, MD on 03/26/2012 at 6:44   Please let her know that her labs show that she has been exposed to pertussis (this may include immunization, but we can't know for sure).  If she still has the cough next week, I want her to have a culture and PCR for pertussis. It has to be collected by Dacron swab from the posterior nasopharynx. I'm not sure where to have this collected, best guess is the health department unless Almyra Free knows of something different.   Thanks, B  Will forward to Doc of the day (Dr. Sherene Sires) to address second half as patient would like to travel this weekend.  Patient will also need to be set up from Pertussis PCR and culture-- ATC call Health Dept on hold x .  Will need to call back.

## 2012-04-10 NOTE — Telephone Encounter (Signed)
Spoke with pt and notified of recs per Dr. Sherene Sires She verbalized understanding  Will forward back to Dr Kendrick Fries to address the issue re emphysema dx, thanks

## 2012-04-10 NOTE — Progress Notes (Signed)
Quick Note:  See phone note dated 04/10/12 ______

## 2012-04-10 NOTE — Telephone Encounter (Signed)
If she has taken a Zpack already she does not need anything further to prevent or treat a pertussis infection - if not, give her one and ok to fly

## 2012-04-13 ENCOUNTER — Telehealth: Payer: Self-pay | Admitting: Pulmonary Disease

## 2012-04-13 NOTE — Telephone Encounter (Signed)
I called Lindsay Morse to clarify the CT results.  She does in fact have emphysema (I reviewed the images myself).  Apparently there was some confusion regarding from various recent phone calls.    She told me that she is doing better and her cough has nearly resolved.  She wanted to reschedule tomorrow's visit since she is doing OK.    She understands that she doesn't need a new stress test since she had one in 2011.  I instructed her to let us know if her shortness of breath worsens or if she develops chest pain.

## 2012-04-14 ENCOUNTER — Ambulatory Visit: Payer: Medicare Other | Admitting: Pulmonary Disease

## 2012-05-29 ENCOUNTER — Encounter: Payer: Self-pay | Admitting: Pulmonary Disease

## 2012-07-08 ENCOUNTER — Encounter (INDEPENDENT_AMBULATORY_CARE_PROVIDER_SITE_OTHER): Payer: Medicare Other

## 2012-07-08 DIAGNOSIS — I6529 Occlusion and stenosis of unspecified carotid artery: Secondary | ICD-10-CM

## 2012-07-13 ENCOUNTER — Other Ambulatory Visit: Payer: Self-pay | Admitting: *Deleted

## 2012-07-13 NOTE — Telephone Encounter (Signed)
Patient request refills printed to mail to [facility]

## 2012-07-14 MED ORDER — DICLOFENAC SODIUM 1 % TD GEL: 2.0000 g | Freq: Four times a day (QID) | TRANSDERMAL | Status: DC | PRN

## 2012-07-14 MED ORDER — LIDOCAINE 5 % EX PTCH: 2.0000 | MEDICATED_PATCH | CUTANEOUS | Status: DC

## 2012-07-14 NOTE — Telephone Encounter (Signed)
Printed and in your inbox

## 2012-07-14 NOTE — Telephone Encounter (Signed)
She will want 90 day supplies. Print and put in my box

## 2012-10-05 ENCOUNTER — Ambulatory Visit (INDEPENDENT_AMBULATORY_CARE_PROVIDER_SITE_OTHER): Payer: Medicare Other | Admitting: Family Medicine

## 2012-10-05 ENCOUNTER — Encounter: Payer: Self-pay | Admitting: Family Medicine

## 2012-10-05 VITALS — BP 138/74 | HR 80 | Temp 98.5°F | Ht 63.0 in | Wt 178.5 lb

## 2012-10-05 DIAGNOSIS — J438 Other emphysema: Secondary | ICD-10-CM

## 2012-10-05 DIAGNOSIS — J439 Emphysema, unspecified: Secondary | ICD-10-CM

## 2012-10-05 DIAGNOSIS — K589 Irritable bowel syndrome without diarrhea: Secondary | ICD-10-CM

## 2012-10-05 DIAGNOSIS — J449 Chronic obstructive pulmonary disease, unspecified: Secondary | ICD-10-CM

## 2012-10-05 DIAGNOSIS — R5383 Other fatigue: Secondary | ICD-10-CM

## 2012-10-05 DIAGNOSIS — Z79899 Other long term (current) drug therapy: Secondary | ICD-10-CM

## 2012-10-05 DIAGNOSIS — E785 Hyperlipidemia, unspecified: Secondary | ICD-10-CM

## 2012-10-05 DIAGNOSIS — IMO0001 Reserved for inherently not codable concepts without codable children: Secondary | ICD-10-CM

## 2012-10-05 DIAGNOSIS — J4489 Other specified chronic obstructive pulmonary disease: Secondary | ICD-10-CM

## 2012-10-05 DIAGNOSIS — R5381 Other malaise: Secondary | ICD-10-CM

## 2012-10-05 LAB — BASIC METABOLIC PANEL
BUN: 14 mg/dL (ref 6–23)
Chloride: 105 mEq/L (ref 96–112)
Glucose, Bld: 92 mg/dL (ref 70–99)
Potassium: 4.4 mEq/L (ref 3.5–5.1)

## 2012-10-05 LAB — HEPATIC FUNCTION PANEL
ALT: 23 U/L (ref 0–35)
Bilirubin, Direct: 0 mg/dL (ref 0.0–0.3)
Total Bilirubin: 0.3 mg/dL (ref 0.3–1.2)
Total Protein: 7.1 g/dL (ref 6.0–8.3)

## 2012-10-05 LAB — CBC WITH DIFFERENTIAL/PLATELET
Eosinophils Absolute: 0.1 10*3/uL (ref 0.0–0.7)
Eosinophils Relative: 1.2 % (ref 0.0–5.0)
HCT: 41.9 % (ref 36.0–46.0)
Lymphs Abs: 3.1 10*3/uL (ref 0.7–4.0)
MCHC: 34 g/dL (ref 30.0–36.0)
MCV: 96 fl (ref 78.0–100.0)
Monocytes Absolute: 0.6 10*3/uL (ref 0.1–1.0)
Neutrophils Relative %: 55.8 % (ref 43.0–77.0)
Platelets: 246 10*3/uL (ref 150.0–400.0)

## 2012-10-05 LAB — LIPID PANEL
Cholesterol: 269 mg/dL — ABNORMAL HIGH (ref 0–200)
HDL: 41.8 mg/dL (ref >39.00)
Triglycerides: 273 mg/dL — ABNORMAL HIGH (ref 0.0–149.0)

## 2012-10-05 LAB — TSH: TSH: 0.86 u[IU]/mL (ref 0.35–5.50)

## 2012-10-05 MED ORDER — DICLOFENAC SODIUM 1 % TD GEL: 4.0000 g | Freq: Four times a day (QID) | TRANSDERMAL | Status: DC | PRN

## 2012-10-05 MED ORDER — CONJ ESTROG-MEDROXYPROGEST ACE 0.3-1.5 MG PO TABS: 1.0000 | ORAL_TABLET | Freq: Every day | ORAL | Status: DC

## 2012-10-05 MED ORDER — TIOTROPIUM BROMIDE MONOHYDRATE 18 MCG IN CAPS: 18.0000 ug | ORAL_CAPSULE | Freq: Every day | RESPIRATORY_TRACT | Status: DC

## 2012-10-05 MED ORDER — ALBUTEROL SULFATE HFA 108 (90 BASE) MCG/ACT IN AERS: 2.0000 | INHALATION_SPRAY | Freq: Four times a day (QID) | RESPIRATORY_TRACT | Status: DC | PRN

## 2012-10-05 MED ORDER — BUDESONIDE-FORMOTEROL FUMARATE 160-4.5 MCG/ACT IN AERO: 2.0000 | INHALATION_SPRAY | Freq: Two times a day (BID) | RESPIRATORY_TRACT | Status: DC

## 2012-10-05 MED ORDER — LIDOCAINE 5 % EX PTCH: 2.0000 | MEDICATED_PATCH | CUTANEOUS | Status: DC

## 2012-10-05 MED ORDER — CYCLOBENZAPRINE HCL 10 MG PO TABS: 10.0000 mg | ORAL_TABLET | Freq: Three times a day (TID) | ORAL | Status: DC | PRN

## 2012-10-05 NOTE — Progress Notes (Signed)
Nature conservation officer at [facility] [address] [place] [address] Phone: [phone] Fax: [phone]  Date:  10/05/2012   Name:  Lindsay Morse   DOB:  10/17/1955   MRN:  [id number] Gender: female Age: 57 y.o.  Primary Physician:  Hannah Beat, MD  Evaluating MD: Hannah Beat, MD   Chief Complaint: medication refills   History of Present Illness:  Lindsay Morse is a 57 y.o. pleasant patient who presents with the following:  F/u multiple chronic medical problems:  Down to 1/2 a pack a day. Has been unable to fully quit.   Last three or four weeks and left arm will start aching like a toothache. Neck. L sided  Fibromyalgia is relatively stable on lidoderm patches, voltaren gel.   COPD: compliant with spiriva and less compliant with symbicort, but she will use it during seasonal changes when she can tell a difference in her lungs.   She d/c elavil b/c did not think it made much difference.   Patient Active Problem List   Diagnosis Date Noted  . Hemoptysis 03/25/2012  . 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 COLONIC POLYPS, HX OF 08/30/2009  . IBS 06/26/2009  . TOBACCO ABUSE 04/07/2009  . SLEEP DISORDER 09/26/2008  . HYPERLIPIDEMIA 08/19/2008  . Depression 08/09/2008  . FIBROMYALGIA 11/25/2007    Past Medical History  Diagnosis Date  . OA (osteoarthritis)     hips, back  . Irritable bowel syndrome   . HTN (hypertension)     now off all meds  . Fibromyalgia   . Depression   . Hyperlipidemia   . Suicidal behavior     >20 years ago  . COPD (chronic obstructive pulmonary disease)   . Personal history of colonic polyps     Past Surgical History  Procedure Laterality Date  . Carpal tunnel release  2007    right  . Cholecystectomy      Dr. Clydie Braun  . Colonoscopy  2008    repeat every 5 years,polyps- [place] (03/29/2005-Hyperplastic polyp)  . Upper gastrointestinal endoscopy  6/11    duodenitis,maloney dilated    History   Social History  .  Marital Status: Married    Spouse Name: N/A    Number of Children: N/A  . Years of Education: N/A   Occupational History  . Unemployed     Sales   Social History Main Topics  . Smoking status: Current Every Day Smoker -- 0.50 packs/day for 43 years    Types: Cigarettes  . Smokeless tobacco: Never Used  . Alcohol Use: No  . Drug Use: No  . Sexually Active: Not on file   Other Topics Concern  . Not on file   Social History Narrative   December moved from [facility]      Married, [place]      2 children 31,34 (step-40)      Regular exercise-yes, 30 minutes, twice/week             Family History  Problem Relation Age of Onset  . Hypertension Mother   . Uterine cancer Mother     ischemic bowel; back pain, borderline DM  . Coronary artery disease Father   . Heart attack Father   . Coronary artery disease Sister     first MI at 14, 3 cardiac procedures  . Heart attack Sister   . Crohn's disease Sister   . Colon cancer Maternal Aunt   . Colon cancer Maternal Grandfather   . Alcohol  abuse Father   . Thyroid disease Sister     dysfunction  . Asthma Mother     Allergies  Allergen Reactions  . Regadenoson Other (See Comments)    Trouble breathing  . Milnacipran     REACTION: tachycardia    Medication list has been reviewed and updated.  Outpatient Prescriptions Prior to Visit  Medication Sig Dispense Refill  . albuterol (PROVENTIL HFA;VENTOLIN HFA) 108 (90 BASE) MCG/ACT inhaler Inhale 2 puffs into the lungs every 6 (six) hours as needed for wheezing.  1 Inhaler  2  . amitriptyline (ELAVIL) 25 MG tablet Take 1 tablet (25 mg total) by mouth at bedtime.  30 tablet  2  . budesonide-formoterol (SYMBICORT) 160-4.5 MCG/ACT inhaler Inhale 2 puffs into the lungs 2 (two) times daily.  1 Inhaler  3  . cyclobenzaprine (FLEXERIL) 10 MG tablet Take 1 tablet (10 mg total) by mouth 3 (three) times daily as needed.  50 tablet  11  . diclofenac sodium (VOLTAREN) 1 % GEL Apply 2 g  topically 4 (four) times daily as needed.  300 g  3  . estrogen, conjugated,-medroxyprogesterone (PREMPRO) 0.3-1.5 MG per tablet Take 1 tablet by mouth daily.  90 tablet  3  . lidocaine (LIDODERM) 5 % Place 2 patches onto the skin daily. Remove & Discard patch within 12 hours or as directed by MD  180 patch  3  . Multiple Vitamins-Minerals (CENTRUM SILVER PO) Take by mouth daily.        . nicotine (NICOTROL) 10 MG inhaler Inhale 1 puff into the lungs as needed for smoking cessation.  42 each  1  . tiotropium (SPIRIVA HANDIHALER) 18 MCG inhalation capsule Place 1 capsule (18 mcg total) into inhaler and inhale daily.  90 capsule  3  . chlorpheniramine-HYDROcodone (TUSSIONEX) 10-8 MG/5ML LQCR Take 5 mLs by mouth at bedtime as needed.  140 mL  0  . pantoprazole (PROTONIX) 40 MG tablet Take 1 tablet (40 mg total) by mouth daily.  90 tablet  3  . fluconazole (DIFLUCAN) 200 MG tablet Take 1 tablet (200 mg total) by mouth daily.  14 tablet  0   No facility-administered medications prior to visit.    Review of Systems:   GEN: No acute illnesses, no fevers, chills. GI: No n/v/d, eating normally Pulm: No SOB Interactive and getting along well at home.  Otherwise, ROS is as per the HPI.   Physical Examination: BP 138/74  Pulse 80  Temp(Src) 98.5 F (36.9 C) (Oral)  Ht 5\' 3"  (1.6 m)  Wt 178 lb 8 oz (80.967 kg)  BMI 31.63 kg/m2  Ideal Body Weight: Weight in (lb) to have BMI = 25: 140.8   GEN: WDWN, NAD, Non-toxic, A & O x 3 HEENT: Atraumatic, Normocephalic. Neck supple. No masses, No LAD. Ears and Nose: No external deformity. CV: RRR, No M/G/R. No JVD. No thrill. No extra heart sounds. PULM: Rare wheezes, no crackles, rhonchi. No retractions. No resp. distress. No accessory muscle use. EXTR: No c/c/e NEURO Normal gait.  PSYCH: Normally interactive. Conversant. Not depressed or anxious appearing.  Calm demeanor.    *RADIOLOGY REPORT*   Clinical Data: Productive cough.  Chest  congestion.  Emphysema.   CT CHEST WITHOUT CONTRAST   Technique:  Multidetector CT imaging of the chest was performed following the standard protocol without IV contrast.   Comparison: No priors.   Findings:   Mediastinum: Heart size is normal. There is no significant pericardial fluid, thickening or pericardial calcification.  There is atherosclerosis of the thoracic aorta, the great vessels of the mediastinum and the coronary arteries, including calcified atherosclerotic plaque in the left main, left anterior descending, left circumflex and right coronary arteries. No pathologically enlarged mediastinal or hilar lymph nodes. Please note that accurate exclusion of hilar adenopathy is limited on noncontrast CT scans.  Esophagus is unremarkable in appearance.   Lungs/Pleura: No acute consolidative air space disease.  No pleural effusions.  No definite suspicious appearing pulmonary nodules or masses are identified.  There is a 3 mm ground-glass attenuation nodule in the superior segment of the left lower lobe (image 23 of series 3); nodules of this appearance and size are considered statistically benign and require no imaging follow-up.  Mild bilateral apical nodular pleural parenchymal thickening, likely represent chronic post infectious scarring.  Mild diffuse bronchial wall thickening with mild centrilobular and paraseptal emphysema.   Upper Abdomen: Status post cholecystectomy. Subcentimeter low attenuation lesion in segment eight of the liver is incompletely characterized.  1.2 cm low attenuation right adrenal nodule (1 HU) is compatible with an adenoma. Atherosclerosis.   Musculoskeletal: There are no aggressive appearing lytic or blastic lesions noted in the visualized portions of the skeleton.   IMPRESSION: 1.  No acute findings in the thorax to account for the patient's symptoms. 2.  Mild diffuse bronchial wall thickening with mild centrilobular paraseptal  emphysema; imaging findings compatible with underlying COPD. 3. Atherosclerosis, including left main and three-vessel coronary artery disease. Please note that although the presence of coronary artery calcium documents the presence of coronary artery disease, the severity of this disease and any potential stenosis cannot be assessed on this non-gated CT examination.  Assessment for potential risk factor modification, dietary therapy or pharmacologic therapy may be warranted, if clinically indicated.   4.  1.2 cm low attenuation right adrenal nodules compatible with an adenoma. 5.  Status post cholecystectomy. 6.  Additional incidental findings, as above.     Original Report Authenticated By: Trudie Reed, M.  Nuclear Med Study 1 or 2 day study:  1 day     Stress Test Type:  Eugenie Birks Reading MD:  Dietrich Pates, MD     Referring MD:  Karleen Hampshire Pheobe Sandiford Resting Radionuclide:  Technetium 21m Tetrofosmin     Resting Radionuclide Dose:  10.2 mCi  Stress Radionuclide:  Technetium 37m Tetrofosmin     Stress Radionuclide Dose:  33.0 mCi     Stress Protocol     Lexiscan: 0.4 mg     Stress Test Technologist:  Milana Na EMT-P     Nuclear Technologist:  Burna Mortimer Deal RT-N   Rest Procedure   Myocardial perfusion imaging was performed at rest 45 minutes following the intravenous administration of Myoview Technetium 37m Tetrofosmin.   Stress Procedure   The patient received IV Lexiscan 0.4 mg over 15-seconds.  Myoview injected at 30-seconds.  There were no significant changes, but she had chest tightness with radiation to her jaw with infusion.  Quantitative spect images were obtained after a 45 minute delay.   QPS Raw Data Images:  Imageswere motion corrected.  Note soft tissue (diaphragm, breast) surround heart. Stress Images:  There is normal uptake in all areas. Rest Images:  Normal homogeneous uptake in all areas of the myocardium. Subtraction (SDS):  No evidence of  ischemia. Transient Ischemic Dilatation:  .94  (Normal <1.22)  Lung/Heart Ratio:  .33  (Normal <0.45)   Quantitative Gated Spect Images QGS EDV:  60 ml QGS ESV:  16 ml QGS EF:  74 %     Overall Impression   Exercise Capacity: Lexiscan BP Response: Normal blood pressure response. Clinical Symptoms: Mild chest pain/dyspnea. ECG Impression: No significant ST segment change suggestive of ischemia. Overall Impression: Normal stress nuclear study.   Appended Document: Cardiology Nuclear Study    Assessment and Plan:  COPD (chronic obstructive pulmonary disease) - Plan: budesonide-formoterol (SYMBICORT) 160-4.5 MCG/ACT inhaler  HYPERLIPIDEMIA - Plan: Lipid panel  COPD with emphysema  FIBROMYALGIA  IBS  Encounter for long-term (current) use of other medications - Plan: Basic metabolic panel, CBC with Differential, Hepatic function panel  Other malaise and fatigue - Plan: TSH  Reviewed above studies again with patient. Coronary calcium ID'd on CT, but 03/2009 nuclear stress test is normal and patient asymptomatic. Reviewed reduction of coronary risk factors. Recheck FLP. Working on tobacco - at least less than before.  In reviewing the chart and discussing with patient, there is confusion about what was related to her. I certainly never told the patient that she did not have COPD, since I diagnosed her with COPD initially and have been treating her for years. I did make some comments about coronary calcium that Dr. Kendrick Fries asked me about, but it seems as if there was miscommunication from our office staff. I know Lindsay Morse well, and she understands now.  Check labs. Refill all meds.   Orders Today:  Orders Placed This Encounter  Procedures  . Basic metabolic panel  . CBC with Differential  . Hepatic function panel  . Lipid panel  . TSH  . LDL cholesterol, direct    Updated Medication List: (Includes new medications, updates to list, dose adjustments) Meds ordered this  encounter  Medications  . budesonide-formoterol (SYMBICORT) 160-4.5 MCG/ACT inhaler    Sig: Inhale 2 puffs into the lungs 2 (two) times daily.    Dispense:  3 Inhaler    Refill:  3  . albuterol (PROVENTIL HFA;VENTOLIN HFA) 108 (90 BASE) MCG/ACT inhaler    Sig: Inhale 2 puffs into the lungs every 6 (six) hours as needed for wheezing.    Dispense:  3 Inhaler    Refill:  1    Use with spacer  . cyclobenzaprine (FLEXERIL) 10 MG tablet    Sig: Take 1 tablet (10 mg total) by mouth 3 (three) times daily as needed.    Dispense:  90 tablet    Refill:  3  . diclofenac sodium (VOLTAREN) 1 % GEL    Sig: Apply 4 g topically 4 (four) times daily as needed.    Dispense:  500 g    Refill:  3  . estrogen, conjugated,-medroxyprogesterone (PREMPRO) 0.3-1.5 MG per tablet    Sig: Take 1 tablet by mouth daily.    Dispense:  90 tablet    Refill:  3  . lidocaine (LIDODERM) 5 %    Sig: Place 2 patches onto the skin daily. Remove & Discard patch within 12 hours or as directed by MD    Dispense:  180 patch    Refill:  3  . tiotropium (SPIRIVA HANDIHALER) 18 MCG inhalation capsule    Sig: Place 1 capsule (18 mcg total) into inhaler and inhale daily.    Dispense:  90 capsule    Refill:  3    Medications Discontinued: Medications Discontinued During This Encounter  Medication Reason  . fluconazole (DIFLUCAN) 200 MG tablet Completed Course  . budesonide-formoterol (SYMBICORT) 160-4.5 MCG/ACT inhaler Reorder  . albuterol (PROVENTIL HFA;VENTOLIN HFA) 108 (90 BASE) MCG/ACT inhaler Reorder  .  cyclobenzaprine (FLEXERIL) 10 MG tablet Reorder  . diclofenac sodium (VOLTAREN) 1 % GEL Reorder  . estrogen, conjugated,-medroxyprogesterone (PREMPRO) 0.3-1.5 MG per tablet Reorder  . lidocaine (LIDODERM) 5 % Reorder  . tiotropium (SPIRIVA HANDIHALER) 18 MCG inhalation capsule Reorder      Signed, Karleen Hampshire T. Kimia Finan, MD 10/05/2012 11:45 AM

## 2012-10-08 ENCOUNTER — Telehealth: Payer: Self-pay

## 2012-10-08 NOTE — Telephone Encounter (Signed)
Pt left v/m requesting lab results called to [phone].

## 2012-10-09 NOTE — Telephone Encounter (Signed)
Patient request lab results but i have not seen encounter but, labs are in there resulted but not on your desk top. Please review and advise

## 2012-10-09 NOTE — Telephone Encounter (Signed)
Never mind I see were you sent the results to my chart.

## 2012-10-11 NOTE — Telephone Encounter (Signed)
I released the results with comments to the patient on MyChart. Please tell them that. If they don't remember how to log-on, then you can verbally read what I wrote.

## 2012-10-12 NOTE — Telephone Encounter (Signed)
Left message for patient to return my call.

## 2012-10-13 NOTE — Telephone Encounter (Signed)
Pt left v/m requesting cb [id number].

## 2012-10-14 ENCOUNTER — Encounter: Payer: Self-pay | Admitting: *Deleted

## 2012-10-14 MED ORDER — ROSUVASTATIN CALCIUM 10 MG PO TABS: 10.0000 mg | ORAL_TABLET | Freq: Every day | ORAL | Status: DC

## 2012-10-14 NOTE — Addendum Note (Signed)
Addended by: Hannah Beat on: 10/14/2012 04:44 PM   Modules accepted: Orders

## 2012-10-14 NOTE — Telephone Encounter (Signed)
Done   Hannah Beat, MD 10/14/2012, 4:43 PM

## 2012-10-14 NOTE — Telephone Encounter (Signed)
Spoke with patient and she's requesting a medication for her cholesterol. Pt would like 30 day supply sent to [facility] and #90 with 3 refills printed so she can mail to [place]. Please advise

## 2012-10-15 NOTE — Addendum Note (Signed)
Addended by: Sueanne Margarita on: 10/15/2012 09:10 AM   Modules accepted: Orders

## 2012-10-15 NOTE — Telephone Encounter (Signed)
Spoke with patient and advised rx ready for pick-up and it will be at the front desk. rx sent to pharmacy by e-script

## 2012-10-30 ENCOUNTER — Encounter: Payer: Self-pay | Admitting: Family Medicine

## 2012-10-30 ENCOUNTER — Ambulatory Visit (INDEPENDENT_AMBULATORY_CARE_PROVIDER_SITE_OTHER): Payer: Medicare Other | Admitting: Family Medicine

## 2012-10-30 VITALS — BP 124/84 | HR 88 | Temp 98.0°F | Wt 177.5 lb

## 2012-10-30 DIAGNOSIS — M549 Dorsalgia, unspecified: Secondary | ICD-10-CM

## 2012-10-30 DIAGNOSIS — R109 Unspecified abdominal pain: Secondary | ICD-10-CM

## 2012-10-30 LAB — POCT URINALYSIS DIPSTICK
Glucose, UA: NEGATIVE
Ketones, UA: NEGATIVE
Spec Grav, UA: 1.02
Urobilinogen, UA: 0.2

## 2012-10-30 MED ORDER — CIPROFLOXACIN HCL 250 MG PO TABS: 250.0000 mg | ORAL_TABLET | Freq: Two times a day (BID) | ORAL | Status: DC

## 2012-10-30 NOTE — Assessment & Plan Note (Addendum)
With some urgency and frequency - UA with small LE and tr blood.   Micro - possibly contaminated - I have asked her to collect another urine specimen and will send that for culture. In interim, for possible UTI - treat with cipro 250mg  bid x 3 days.   If not better with this, consider trial off crestor. Pt agrees with plan.

## 2012-10-30 NOTE — Progress Notes (Signed)
  Subjective:    Patient ID: Lindsay Morse, female    DOB: [birth year], 56 y.o.   MRN: [id number]  HPI CC: mid back pain  3-4 d h/o pain in mid back band like distribution with radiation to mid section and lateral sides.  However denies urinary symptoms like dysuria, hematuria.  + frequency and urgency in last few days.  Denies fevers/chills, nausea/vomiting, lower abd pain.  Denies inciting trauma/injury.  Has run out of prempro for the last month so increasing hot flashes.  Last UTI was was 1 year ago.  Has presented with back pain alone in the past. Has had back problems in the past - fibromyalgia, spinal stenosis and osteoarthritis.  Only med change - started crestor last week.  Past Medical History  Diagnosis Date  . OA (osteoarthritis)     hips, back  . Irritable bowel syndrome   . HTN (hypertension)     now off all meds  . Fibromyalgia   . Depression   . Hyperlipidemia   . Suicidal behavior     >20 years ago  . COPD (chronic obstructive pulmonary disease)   . Personal history of colonic polyps      Review of Systems Per HPI    Objective:   Physical Exam  Nursing note and vitals reviewed. Constitutional: She appears well-developed and well-nourished. No distress.  Abdominal: Soft. Normal appearance and bowel sounds are normal. She exhibits no distension and no mass. There is no hepatosplenomegaly. There is tenderness (mild) in the right upper quadrant, suprapubic area and left upper quadrant. There is CVA tenderness (mild). There is no rigidity, no rebound, no guarding and negative Murphy's sign.  Musculoskeletal: She exhibits no edema.  No significant midline spine tenderness.  + lower thoracic paraspinous mm tenderness bilaterally       Assessment & Plan:

## 2012-10-30 NOTE — Patient Instructions (Signed)
Urine possibly contaminated.  We will recollect and send that for culture. For possible UTI - treat with cipro twice daily for 3 days.  If not better with this, do a trial off of crestor. If not better with this, return to see [place].

## 2012-11-01 LAB — URINE CULTURE
Colony Count: NO GROWTH
Organism ID, Bacteria: NO GROWTH

## 2012-11-18 ENCOUNTER — Other Ambulatory Visit: Payer: Self-pay

## 2012-11-18 MED ORDER — PANTOPRAZOLE SODIUM 40 MG PO TBEC: 40.0000 mg | DELAYED_RELEASE_TABLET | Freq: Every day | ORAL | Status: DC

## 2012-11-18 NOTE — Telephone Encounter (Signed)
Pt left v/m requesting 30 day rx for protonix to [facility]. Pt notified done. Pt also request 90 day written rx for protonix for mail order pharmacy; pt mails the rx to [facility]. Call pt when rx ready for pick up.

## 2012-11-18 NOTE — Telephone Encounter (Signed)
Can you help me do this   #30, 0 ref to [facility]  #90, 3 ref to mail order (print for [facility])  Protonix 40 mg, 1 po daily

## 2012-11-19 MED ORDER — PANTOPRAZOLE SODIUM 40 MG PO TBEC: 40.0000 mg | DELAYED_RELEASE_TABLET | Freq: Every day | ORAL | Status: DC

## 2012-11-19 NOTE — Telephone Encounter (Signed)
Patient notified prescription for Protonix to send in to her mail order is ready to be picked up.

## 2012-11-19 NOTE — Telephone Encounter (Signed)
I entered this prescription and it would not print for me.  Can you please try to print it.

## 2012-12-21 ENCOUNTER — Ambulatory Visit (INDEPENDENT_AMBULATORY_CARE_PROVIDER_SITE_OTHER): Payer: Medicare Other | Admitting: Family Medicine

## 2012-12-21 ENCOUNTER — Encounter: Payer: Self-pay | Admitting: Family Medicine

## 2012-12-21 VITALS — BP 130/84 | HR 81 | Temp 98.2°F | Ht 63.0 in | Wt 178.5 lb

## 2012-12-21 DIAGNOSIS — M5412 Radiculopathy, cervical region: Secondary | ICD-10-CM

## 2012-12-21 DIAGNOSIS — M5010 Cervical disc disorder with radiculopathy, unspecified cervical region: Secondary | ICD-10-CM

## 2012-12-21 MED ORDER — PREDNISONE 20 MG PO TABS: ORAL_TABLET | ORAL | Status: DC

## 2012-12-21 NOTE — Progress Notes (Signed)
Nature conservation officer at [facility] [address] [place] [address] Phone: [phone] Fax: [phone]  Date:  12/21/2012   Name:  Lindsay Morse   DOB:  Apr 02, 1955   MRN:  [id number] Gender: female Age: 57 y.o.  Primary Physician:  Hannah Beat, MD   Chief Complaint: Arm Pain   History of Present Illness:  Lindsay Morse is a 57 y.o. pleasant patient who presents with the following:  Patient known well with significant spine disease and fibromyalgia. She has had ESI for lumbar disease in the past.  Started on her left arm and now going down both of her arms this week. SUbj weakness, tingling and numbness bilaterally. She has seen Dr. Danielle Dess a few times in the past as well.  11/2011 MRI C Spine *RADIOLOGY REPORT*   Clinical Data: Cervical spondylosis.   MRI CERVICAL SPINE WITHOUT CONTRAST   Technique:  Multiplanar and multiecho pulse sequences of the cervical spine, to include the craniocervical junction and cervicothoracic junction, were obtained according to standard protocol without intravenous contrast.   Comparison: MRI cervical spine 10/12/2009.   Findings: Normal signal is present in the cervical and upper thoracic spinal cord to the lowest imaged level, T2.  Marrow signal, vertebral body heights, and alignment are normal. Straightening of the normal cervical lordosis is stable.  The craniocervical junction is within normal limits.  Visualized intracranial contents are normal.  Flow is present major vascular structures of the neck.   C2-3:  A leftward disc osteophyte complex and left-sided facet hypertrophy are stable, resulting in mild to moderate left foraminal stenosis.   C3-4:  A broad-based disc osteophyte complex is present. Uncovertebral spurring is evident bilaterally.  There is some progression of moderate foraminal stenosis bilaterally.  Mild central canal narrowing is evident.   C4-5:  A broad-based disc osteophyte complex is present.   Mild central and mild moderate foraminal stenosis is stable.   C5-6:  A broad-based disc osteophyte complex is present.  Mild right and central canal stenosis is stable.   C6-7:  A broad-based disc osteophyte complex is stable.  There is effacement of the ventral CSF.  Mild left foraminal narrowing is evident.   C7-T1:  A right perineural root sleeve cyst is again seen.  A shallow right paracentral disc protrusion is evident.  There is no significant stenosis.   Bilateral perineural root sleeve cysts are evident at T1-2.   IMPRESSION:   1.  Progression of moderate foraminal stenosis bilaterally at C3-4 secondary to a broad-based disc osteophyte complex uncovertebral spurring. 2.  Multilevel cervical spondylosis is otherwise stable.     Original Report Authenticated By: Jamesetta Orleans. MATTERN, M.D.   Patient Active Problem List   Diagnosis Date Noted  . Hemoptysis 03/25/2012  . 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 COLONIC POLYPS, HX OF 08/30/2009  . IBS 06/26/2009  . TOBACCO ABUSE 04/07/2009  . SLEEP DISORDER 09/26/2008  . HYPERLIPIDEMIA 08/19/2008  . Depression 08/09/2008  . FIBROMYALGIA 11/25/2007    Past Medical History  Diagnosis Date  . OA (osteoarthritis)     hips, back  . Irritable bowel syndrome   . HTN (hypertension)     now off all meds  . Fibromyalgia   . Depression   . Hyperlipidemia   . Suicidal behavior     >20 years ago  . COPD (chronic obstructive pulmonary disease)   . Personal history of colonic polyps     Past Surgical History  Procedure Laterality Date  .  Carpal tunnel release  2007    right  . Cholecystectomy      Dr. Clydie Braun  . Colonoscopy  2008    repeat every 5 years,polyps- [place], [place] (03/29/2005-Hyperplastic polyp)  . Upper gastrointestinal endoscopy  6/11    duodenitis,maloney dilated    History   Social History  . Marital Status: Married    Spouse Name: N/A    Number of Children: N/A  . Years of Education: N/A    Occupational History  . Unemployed     Sales   Social History Main Topics  . Smoking status: Current Every Day Smoker -- 0.50 packs/day for 43 years    Types: Cigarettes  . Smokeless tobacco: Never Used  . Alcohol Use: No  . Drug Use: No  . Sexual Activity: Not on file   Other Topics Concern  . Not on file   Social History Narrative   December moved from [facility]      Married, [place]      2 children 31,34 (step-40)      Regular exercise-yes, 30 minutes, twice/week             Family History  Problem Relation Age of Onset  . Hypertension Mother   . Uterine cancer Mother     ischemic bowel; back pain, borderline DM  . Coronary artery disease Father   . Heart attack Father   . Coronary artery disease Sister     first MI at 63, 3 cardiac procedures  . Heart attack Sister   . Crohn's disease Sister   . Colon cancer Maternal Aunt   . Colon cancer Maternal Grandfather   . Alcohol abuse Father   . Thyroid disease Sister     dysfunction  . Asthma Mother     Allergies  Allergen Reactions  . Regadenoson Other (See Comments)    Trouble breathing  . Milnacipran     REACTION: tachycardia    Medication list has been reviewed and updated.  Outpatient Prescriptions Prior to Visit  Medication Sig Dispense Refill  . albuterol (PROVENTIL HFA;VENTOLIN HFA) 108 (90 BASE) MCG/ACT inhaler Inhale 2 puffs into the lungs every 6 (six) hours as needed for wheezing.  3 Inhaler  1  . budesonide-formoterol (SYMBICORT) 160-4.5 MCG/ACT inhaler Inhale 2 puffs into the lungs 2 (two) times daily.  3 Inhaler  3  . cyclobenzaprine (FLEXERIL) 10 MG tablet Take 1 tablet (10 mg total) by mouth 3 (three) times daily as needed.  90 tablet  3  . diclofenac sodium (VOLTAREN) 1 % GEL Apply 4 g topically 4 (four) times daily as needed.  500 g  3  . estrogen, conjugated,-medroxyprogesterone (PREMPRO) 0.3-1.5 MG per tablet Take 1 tablet by mouth daily.  90 tablet  3  . lidocaine (LIDODERM) 5 %  Place 2 patches onto the skin daily. Remove & Discard patch within 12 hours or as directed by MD  180 patch  3  . Multiple Vitamins-Minerals (CENTRUM SILVER PO) Take by mouth daily.        . nicotine (NICOTROL) 10 MG inhaler Inhale 1 puff into the lungs as needed for smoking cessation.  42 each  1  . pantoprazole (PROTONIX) 40 MG tablet Take 1 tablet (40 mg total) by mouth daily.  90 tablet  3  . rosuvastatin (CRESTOR) 10 MG tablet Take 1 tablet (10 mg total) by mouth daily.  90 tablet  3  . tiotropium (SPIRIVA HANDIHALER) 18 MCG inhalation capsule Place 1 capsule (18 mcg  total) into inhaler and inhale daily.  90 capsule  3  . ciprofloxacin (CIPRO) 250 MG tablet Take 1 tablet (250 mg total) by mouth 2 (two) times daily.  6 tablet  0   No facility-administered medications prior to visit.    Review of Systems:   GEN: No fevers, chills. Nontoxic. Primarily MSK c/o today. MSK: Detailed in the HPI GI: tolerating PO intake without difficulty Neuro: detailed above Otherwise the pertinent positives of the ROS are noted above.    Physical Examination: BP 130/84  Pulse 81  Temp(Src) 98.2 F (36.8 C) (Oral)  Ht 5\' 3"  (1.6 m)  Wt 178 lb 8 oz (80.967 kg)  BMI 31.63 kg/m2  Ideal Body Weight: Weight in (lb) to have BMI = 25: 140.8   GEN: Well-developed,well-nourished,in no acute distress; alert,appropriate and cooperative throughout examination HEENT: Normocephalic and atraumatic without obvious abnormalities. Ears, externally no deformities PULM: Breathing comfortably in no respiratory distress EXT: No clubbing, cyanosis, or edema PSYCH: Normally interactive. Cooperative during the interview. Pleasant. Friendly and conversant. Not anxious or depressed appearing. Normal, full affect.  CERVICAL SPINE EXAM Range of motion: Flexion, extension, lateral bending, and rotation: full Pain with terminal motion: no Spinous Processes: NT SCM: NT Upper paracervical muscles: mildly tight Upper traps:  NT C5-T1 intact, motor appears intact DTR 2+ Sensation she reports is diffusely decreased  Assessment and Plan:  Cervical disc disorder with radiculopathy of cervical region   Aggravation of known disease, cont heat, muscle relaxers, lidoderm. Add pred burse and taper for 10 days.  Orders Today:  No orders of the defined types were placed in this encounter.    Updated Medication List: (Includes new medications, updates to list, dose adjustments) Meds ordered this encounter  Medications  . predniSONE (DELTASONE) 20 MG tablet    Sig: 2 tabs po daily for 5 days, then 1 tab po for 5 days    Dispense:  15 tablet    Refill:  0    Medications Discontinued: Medications Discontinued During This Encounter  Medication Reason  . ciprofloxacin (CIPRO) 250 MG tablet Completed Course     Signed,  Karleen Hampshire T. Jacora Hopkins, MD

## 2013-01-21 ENCOUNTER — Other Ambulatory Visit: Payer: Self-pay

## 2013-02-23 ENCOUNTER — Other Ambulatory Visit: Payer: Self-pay

## 2013-02-23 DIAGNOSIS — Z1231 Encounter for screening mammogram for malignant neoplasm of breast: Secondary | ICD-10-CM

## 2013-04-07 ENCOUNTER — Ambulatory Visit: Payer: Medicare Other

## 2013-05-25 ENCOUNTER — Ambulatory Visit: Payer: Medicare Other

## 2013-05-26 ENCOUNTER — Ambulatory Visit (INDEPENDENT_AMBULATORY_CARE_PROVIDER_SITE_OTHER): Payer: Medicare Other | Admitting: Family Medicine

## 2013-05-26 ENCOUNTER — Telehealth: Payer: Self-pay | Admitting: Family Medicine

## 2013-05-26 ENCOUNTER — Encounter: Payer: Self-pay | Admitting: Family Medicine

## 2013-05-26 VITALS — BP 110/62 | HR 103 | Temp 98.2°F | Ht 63.0 in | Wt 179.5 lb

## 2013-05-26 DIAGNOSIS — M5020 Other cervical disc displacement, unspecified cervical region: Secondary | ICD-10-CM

## 2013-05-26 DIAGNOSIS — M719 Bursopathy, unspecified: Secondary | ICD-10-CM

## 2013-05-26 DIAGNOSIS — M67919 Unspecified disorder of synovium and tendon, unspecified shoulder: Secondary | ICD-10-CM

## 2013-05-26 DIAGNOSIS — M542 Cervicalgia: Secondary | ICD-10-CM

## 2013-05-26 DIAGNOSIS — M5010 Cervical disc disorder with radiculopathy, unspecified cervical region: Secondary | ICD-10-CM

## 2013-05-26 DIAGNOSIS — M75100 Unspecified rotator cuff tear or rupture of unspecified shoulder, not specified as traumatic: Secondary | ICD-10-CM

## 2013-05-26 DIAGNOSIS — M6790 Unspecified disorder of synovium and tendon, unspecified site: Secondary | ICD-10-CM

## 2013-05-26 DIAGNOSIS — IMO0002 Reserved for concepts with insufficient information to code with codable children: Secondary | ICD-10-CM

## 2013-05-26 DIAGNOSIS — M7550 Bursitis of unspecified shoulder: Secondary | ICD-10-CM

## 2013-05-26 NOTE — Patient Instructions (Signed)
REFERRALS TO SPECIALISTS, SPECIAL TESTS (MRI, CT, ULTRASOUNDS)  GO THE WAITING ROOM AND TELL CHECK IN YOU NEED HELP WITH A REFERRAL. Either MARION or LINDA will help you set it up.  If it is between 1-2 PM they may be at lunch.  After 5 PM, they will likely be at home.  They will call you, so please make sure the office has your correct phone number.  Referrals sometimes can be done same day if urgent, but others can take 2 or 3 days to get an appointment. Starting in 2015, some of the new Medicare insurance plans and Affordable Care Health plans offered on the Exchange take longer for referrals. They have added additional paperwork and steps.  MRI's and CT's can take up to a week for the test. (Emergencies like strokes take precedence. I will tell you if you have an emergency.)   Specialist appointment times vary a great deal, mostly on the specialist's schedule and if they have openings. -- Our office tries to get you in as fast as possible. -- Some specialists have very long wait times. (Example. Dermatology. Usually months) -- If you have a true emergency like new cancer, we work to get you in ASAP.   

## 2013-05-26 NOTE — Telephone Encounter (Signed)
Relevant patient education assigned to patient using Emmi. ° °

## 2013-05-26 NOTE — Progress Notes (Signed)
Date:  05/26/2013   Name:  Lindsay Morse   DOB:  01-22-1956   MRN:  [id number]  Primary Physician:  Owens Loffler, MD   Chief Complaint: Arm Pain   Subjective:   History of Present Illness:  Lindsay Morse is a 58 y.o. pleasant patient who presents with the following:  The patient noted above presents with shoulder pain that has been ongoing for at least 1 mo.  there is no history of trauma or accident. The patient has both neck pain or radicular symptoms and known h/o disc herniation at c3-4.Marland Kitchen Denies dislocation, subluxation, separation of the shoulder. The patient does complain of pain in the overhead plane. Pain across chest, and picking up arms, lifting and throbs like a toothache down into arms and fingers.   Husband has been in poor health, CVA, and she has been having to provide almost total care for him.  Medications Tried: all below, nsaids, voltaren, lidocaine patch, tylenol Ice or Heat: both Tried PT: No  Prior shoulder Injury: No Prior surgery: No Prior fracture: No  Patient Active Problem List   Diagnosis Date Noted  . 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 TOBACCO ABUSE 04/07/2009    Priority: High  . FIBROMYALGIA 11/25/2007    Priority: High  . Aortic calcification, 04/07/2012 CT Chest 05/28/2013    Priority: Medium  . Carotid artery calcification 05/28/2013    Priority: Medium  . HYPERLIPIDEMIA 08/19/2008    Priority: Medium  . Major depressive disorder, recurrent episode, in partial remission 08/09/2008    Priority: Medium  . GERD (gastroesophageal reflux disease) 05/28/2013  . Obesity (BMI 30-39.9) 05/28/2013  . Herniation of cervical intervertebral disc with radiculopathy 05/28/2013  . Hemoptysis 03/25/2012  . COLONIC POLYPS, HX OF 08/30/2009  . IBS 06/26/2009  . SLEEP DISORDER 09/26/2008    Past Medical History  Diagnosis Date  . OA (osteoarthritis)     hips, back  . Irritable bowel syndrome   . HTN (hypertension)     now  off all meds  . Fibromyalgia   . Depression   . Hyperlipidemia   . Suicidal behavior     >20 years ago  . COPD (chronic obstructive pulmonary disease)   . Personal history of colonic polyps   . GERD (gastroesophageal reflux disease) 05/28/2013  . Carotid artery calcification 05/28/2013    Past Surgical History  Procedure Laterality Date  . Carpal tunnel release  2007    right  . Cholecystectomy      Dr. Emilio Math  . Colonoscopy  2008    repeat every 5 years,polyps- [place], [place] (03/29/2005-Hyperplastic polyp)  . Upper gastrointestinal endoscopy  6/11    duodenitis,maloney dilated    History   Social History  . Marital Status: Married    Spouse Name: Lindsay Morse    Number of Children: Lindsay Morse  . Years of Education: Lindsay Morse   Occupational History  . Unemployed     Sales   Social History Main Topics  . Smoking status: Current Every Day Smoker -- 0.50 packs/day for 43 years    Types: Cigarettes  . Smokeless tobacco: Never Used  . Alcohol Use: No  . Drug Use: No  . Sexual Activity: Not on file   Other Topics Concern  . Not on file   Social History Narrative   December moved from [facility]      Married, [place]      2 children 31,34 (step-40)      Regular exercise-yes,  30 minutes, twice/week             Family History  Problem Relation Age of Onset  . Hypertension Mother   . Uterine cancer Mother     ischemic bowel; back pain, borderline DM  . Coronary artery disease Father   . Heart attack Father   . Coronary artery disease Sister     first MI at 1, 3 cardiac procedures  . Heart attack Sister   . Crohn's disease Sister   . Colon cancer Maternal Aunt   . Colon cancer Maternal Grandfather   . Alcohol abuse Father   . Thyroid disease Sister     dysfunction  . Asthma Mother     Allergies  Allergen Reactions  . Regadenoson Other (See Comments)    Trouble breathing  . Milnacipran     REACTION: tachycardia    Medication list has been reviewed and  updated.  Review of Systems:  GEN: No fevers, chills. Nontoxic. Primarily MSK c/o today. MSK: Detailed in the HPI GI: tolerating PO intake without difficulty Neuro: as above Otherwise the pertinent positives of the ROS are noted above.   Objective:   Physical Examination: BP 110/62  Pulse 103  Temp(Src) 98.2 F (36.8 C) (Oral)  Ht 5\' 3"  (1.6 m)  Wt 179 lb 8 oz (81.421 kg)  BMI 31.81 kg/m2  Ideal Body Weight: Weight in (lb) to have BMI = 25: 140.8   GEN: Well-developed,well-nourished,in no acute distress; alert,appropriate and cooperative throughout examination HEENT: Normocephalic and atraumatic without obvious abnormalities. Ears, externally no deformities PULM: Breathing comfortably in no respiratory distress EXT: No clubbing, cyanosis, or edema PSYCH: Normally interactive. Cooperative during the interview. Pleasant. Friendly and conversant. Not anxious or depressed appearing. Normal, full affect.  Shoulder: B Inspection: No muscle wasting or winging Ecchymosis/edema: neg  AC joint, scapula, clavicle: TTP on the right Cervical spine: NT, ttp with ext and lateral bending. Minimal loss of motion.  Spurling's: neg Abduction: full, 5/5 Flexion: full, 5/5 IR, full, lift-off: 5/5 ER at neutral: full, 5/5 AC crossover: pos Neer: pos Hawkins: pos Drop Test: neg Empty Can: pos Supraspinatus insertion: mild-mod T Bicipital groove: NT Speed's: pos Yergason's: neg Sulcus sign: neg Scapular dyskinesis: none C5-T1 intact  ALL POS ON BOTH ABOVE  Neuro: Sensation intact Grip 5/5   MRI CERVICAL SPINE WITHOUT CONTRAST   Technique:  Multiplanar and multiecho pulse sequences of the cervical spine, to include the craniocervical junction and cervicothoracic junction, were obtained according to standard protocol without intravenous contrast.   Comparison: MRI cervical spine 10/12/2009.   Findings: Normal signal is present in the cervical and upper thoracic spinal cord  to the lowest imaged level, T2.  Marrow signal, vertebral body heights, and alignment are normal. Straightening of the normal cervical lordosis is stable.  The craniocervical junction is within normal limits.  Visualized intracranial contents are normal.  Flow is present major vascular structures of the neck.   C2-3:  A leftward disc osteophyte complex and left-sided facet hypertrophy are stable, resulting in mild to moderate left foraminal stenosis.   C3-4:  A broad-based disc osteophyte complex is present. Uncovertebral spurring is evident bilaterally.  There is some progression of moderate foraminal stenosis bilaterally.  Mild central canal narrowing is evident.   C4-5:  A broad-based disc osteophyte complex is present.  Mild central and mild moderate foraminal stenosis is stable.   C5-6:  A broad-based disc osteophyte complex is present.  Mild right and central canal stenosis is  stable.   C6-7:  A broad-based disc osteophyte complex is stable.  There is effacement of the ventral CSF.  Mild left foraminal narrowing is evident.   C7-T1:  A right perineural root sleeve cyst is again seen.  A shallow right paracentral disc protrusion is evident.  There is no significant stenosis.   Bilateral perineural root sleeve cysts are evident at T1-2.   IMPRESSION:   1.  Progression of moderate foraminal stenosis bilaterally at C3-4 secondary to a broad-based disc osteophyte complex uncovertebral spurring. 2.  Multilevel cervical spondylosis is otherwise stable.     Original Report Authenticated By: Resa Miner. MATTERN, M.D.   Assessment & Plan:   Tendinopathy of rotator cuff - Plan: Ambulatory referral to Physical Therapy  Subacromial bursitis - Plan: Ambulatory referral to Physical Therapy  Cervicalgia - Plan: Ambulatory referral to Physical Therapy  Herniation of cervical intervertebral disc with radiculopathy  >25 minutes spent in face to face time with patient, >50%  spent in counselling or coordination of care: as below, chart review, MRI review, anatomy and rehab review with patient.  I think very significant RTC tendinopathy and subac bursitis, more likely driven from recent overuse with care of husband.  [facility]  Also with some significant neck pain and probably B radiculopathy, but most of sx seem to be from shoulders.  SubAC Injection, RIGHT Verbal consent was obtained from the patient. Risks (including rare infection), benefits, and alternatives were explained. Patient prepped with Chloraprep and Ethyl Chloride used for anesthesia. The subacromial space was injected using the posterior approach. The patient tolerated the procedure well and had decreased pain post injection. No complications. Injection: 8 cc of Lidocaine 1% and 2 cc of Depo-Medrol 40 mg. Needle: 22 gauge   SubAC Injection, LEFT Verbal consent was obtained from the patient. Risks (including rare infection), benefits, and alternatives were explained. Patient prepped with Chloraprep and Ethyl Chloride used for anesthesia. The subacromial space was injected using the posterior approach. The patient tolerated the procedure well and had decreased pain post injection. No complications. Injection: 8 cc of Lidocaine 1% and 2 cc of Depo-Medrol 40 mg. Needle: 22 gauge   Orders Placed This Encounter  Procedures  . Ambulatory referral to Physical Therapy   Signed,  Frederico Hamman T. Emmauel Hallums, MD, [place] at [facility] [place] [address] Phone: [phone] Fax: [phone]  Patient Instructions  REFERRALS TO SPECIALISTS, SPECIAL TESTS (MRI, CT, ULTRASOUNDS)  GO THE WAITING ROOM AND TELL CHECK IN YOU NEED HELP WITH A REFERRAL. Either MARION or LINDA will help you set it up.  If it is between 1-2 PM they may be at lunch.  After 5 PM, they will likely be at home.  They will call you, so please make sure the office has your correct  phone number.  Referrals sometimes can be done same day if urgent, but others can take 2 or 3 days to get an appointment. Starting in 2015, some of the new Medicare insurance plans and [place] offered on the Exchange take longer for referrals. They have added additional paperwork and steps.  MRI's and CT's can take up to a week for the test. (Emergencies like strokes take precedence. I will tell you if you have an emergency.)   Specialist appointment times vary a great deal, mostly on the specialist's schedule and if they have openings. -- Our office tries to get you in as fast as possible. --  Some specialists have very long wait times. (Example. Dermatology. Usually months) -- If you have a true emergency like new cancer, we work to get you in ASAP.     Patient's Medications  New Prescriptions   No medications on file  Previous Medications   ALBUTEROL (PROVENTIL HFA;VENTOLIN HFA) 108 (90 BASE) MCG/ACT INHALER    Inhale 2 puffs into the lungs every 6 (six) hours as needed for wheezing.   BUDESONIDE-FORMOTEROL (SYMBICORT) 160-4.5 MCG/ACT INHALER    Inhale 2 puffs into the lungs 2 (two) times daily.   CYCLOBENZAPRINE (FLEXERIL) 10 MG TABLET    Take 1 tablet (10 mg total) by mouth 3 (three) times daily as needed.   DICLOFENAC SODIUM (VOLTAREN) 1 % GEL    Apply 4 g topically 4 (four) times daily as needed.   ESTROGEN, CONJUGATED,-MEDROXYPROGESTERONE (PREMPRO) 0.3-1.5 MG PER TABLET    Take 1 tablet by mouth daily.   LIDOCAINE (LIDODERM) 5 %    Place 2 patches onto the skin daily. Remove & Discard patch within 12 hours or as directed by MD   MULTIPLE VITAMINS-MINERALS (CENTRUM SILVER PO)    Take by mouth daily.     NICOTINE (NICOTROL) 10 MG INHALER    Inhale 1 puff into the lungs as needed for smoking cessation.   PANTOPRAZOLE (PROTONIX) 40 MG TABLET    Take 1 tablet (40 mg total) by mouth daily.   ROSUVASTATIN (CRESTOR) 10 MG TABLET    Take 1 tablet (10 mg total) by mouth  daily.   TIOTROPIUM (SPIRIVA HANDIHALER) 18 MCG INHALATION CAPSULE    Place 1 capsule (18 mcg total) into inhaler and inhale daily.  Modified Medications   No medications on file  Discontinued Medications   PREDNISONE (DELTASONE) 20 MG TABLET    2 tabs po daily for 5 days, then 1 tab po for 5 days

## 2013-05-26 NOTE — Progress Notes (Signed)
Pre visit review using our clinic review tool, if applicable. No additional management support is needed unless otherwise documented below in the visit note. 

## 2013-05-28 ENCOUNTER — Encounter: Payer: Self-pay | Admitting: Family Medicine

## 2013-05-28 DIAGNOSIS — K219 Gastro-esophageal reflux disease without esophagitis: Secondary | ICD-10-CM

## 2013-05-28 DIAGNOSIS — M5010 Cervical disc disorder with radiculopathy, unspecified cervical region: Secondary | ICD-10-CM | POA: Insufficient documentation

## 2013-05-28 DIAGNOSIS — I6529 Occlusion and stenosis of unspecified carotid artery: Secondary | ICD-10-CM | POA: Insufficient documentation

## 2013-05-28 DIAGNOSIS — E669 Obesity, unspecified: Secondary | ICD-10-CM | POA: Insufficient documentation

## 2013-05-28 DIAGNOSIS — I700 Atherosclerosis of aorta: Secondary | ICD-10-CM | POA: Insufficient documentation

## 2013-05-28 HISTORY — DX: Gastro-esophageal reflux disease without esophagitis: K21.9

## 2013-05-31 ENCOUNTER — Telehealth: Payer: Self-pay | Admitting: Family Medicine

## 2013-05-31 NOTE — Telephone Encounter (Signed)
Relevant patient education assigned to patient using Emmi. ° °

## 2013-07-05 ENCOUNTER — Ambulatory Visit: Payer: Self-pay | Admitting: Adult Health

## 2013-08-04 ENCOUNTER — Encounter: Payer: Self-pay | Admitting: *Deleted

## 2013-08-04 NOTE — Telephone Encounter (Signed)
Lmom to call our office to schedule 1 year follow up on Carotid Doppler.

## 2013-09-21 NOTE — Telephone Encounter (Signed)
This encounter was created in error - please disregard.

## 2013-10-04 ENCOUNTER — Observation Stay: Payer: Self-pay | Admitting: Family Medicine

## 2013-10-04 DIAGNOSIS — I519 Heart disease, unspecified: Secondary | ICD-10-CM

## 2013-10-04 LAB — HEPATIC FUNCTION PANEL A (ARMC)
ALT: 32 U/L (ref 12–78)
Albumin: 4.1 g/dL (ref 3.4–5.0)
Alkaline Phosphatase: 137 U/L — ABNORMAL HIGH
Bilirubin, Direct: <0.1 mg/dL (ref 0.00–0.20)
Bilirubin,Total: 0.2 mg/dL (ref 0.2–1.0)
SGOT(AST): 30 U/L (ref 15–37)
Total Protein: 7.6 g/dL (ref 6.4–8.2)

## 2013-10-04 LAB — URINALYSIS, COMPLETE
Bilirubin,UR: NEGATIVE
Blood: NEGATIVE
Glucose,UR: NEGATIVE mg/dL (ref 0–75)
Ketone: NEGATIVE
Leukocyte Esterase: NEGATIVE
NITRITE: NEGATIVE
Ph: 7 (ref 4.5–8.0)
Protein: NEGATIVE
RBC,UR: <1 /HPF (ref 0–5)
SPECIFIC GRAVITY: 1.004 (ref 1.003–1.030)

## 2013-10-04 LAB — CBC WITH DIFFERENTIAL/PLATELET
Basophil #: 0.1 10*3/uL (ref 0.0–0.1)
Basophil %: 0.5 %
EOS ABS: 0.1 10*3/uL (ref 0.0–0.7)
Eosinophil %: 0.7 %
HCT: 37.5 % (ref 35.0–47.0)
HGB: 12.6 g/dL (ref 12.0–16.0)
LYMPHS PCT: 23 %
Lymphocyte #: 2.6 10*3/uL (ref 1.0–3.6)
MCH: 32.1 pg (ref 26.0–34.0)
MCHC: 33.6 g/dL (ref 32.0–36.0)
MCV: 96 fL (ref 80–100)
MONO ABS: 0.8 x10 3/mm (ref 0.2–0.9)
Monocyte %: 7.2 %
NEUTROS ABS: 7.8 10*3/uL — AB (ref 1.4–6.5)
Neutrophil %: 68.6 %
PLATELETS: 240 10*3/uL (ref 150–440)
RBC: 3.92 10*6/uL (ref 3.80–5.20)
RDW: 12 % (ref 11.5–14.5)
WBC: 11.4 10*3/uL — ABNORMAL HIGH (ref 3.6–11.0)

## 2013-10-04 LAB — TROPONIN I
Troponin-I: 0.07 ng/mL — ABNORMAL HIGH
Troponin-I: 0.12 ng/mL — ABNORMAL HIGH
Troponin-I: 0.11 ng/mL — ABNORMAL HIGH

## 2013-10-04 LAB — PROTIME-INR
INR: 0.9
Prothrombin Time: 12.1 secs (ref 11.5–14.7)

## 2013-10-04 LAB — BASIC METABOLIC PANEL
Anion Gap: 9 (ref 7–16)
BUN: 17 mg/dL (ref 7–18)
CREATININE: 0.93 mg/dL (ref 0.60–1.30)
Calcium, Total: 8.6 mg/dL (ref 8.5–10.1)
Chloride: 107 mmol/L (ref 98–107)
Co2: 27 mmol/L (ref 21–32)
EGFR (Non-African Amer.): >60
Glucose: 97 mg/dL (ref 65–99)
Osmolality: 286 (ref 275–301)
POTASSIUM: 4.1 mmol/L (ref 3.5–5.1)
SODIUM: 143 mmol/L (ref 136–145)

## 2013-10-04 LAB — APTT: Activated PTT: 32.7 secs (ref 23.6–35.9)

## 2013-10-04 LAB — HEPARIN LEVEL (UNFRACTIONATED): ANTI-XA(UNFRACTIONATED): 0.57 [IU]/mL (ref 0.30–0.70)

## 2013-10-04 LAB — D-DIMER(ARMC): D-DIMER: 468 ng/mL

## 2013-10-05 ENCOUNTER — Telehealth: Payer: Self-pay | Admitting: *Deleted

## 2013-10-05 NOTE — Telephone Encounter (Signed)
Patient contacted regarding discharge from [facility] on 10/05/13.  Patient understands to follow up with provider Ignacia Bayley NP on 10/12/13 at 2:45 pm at [facility]. Patient understands discharge instructions? yes Patient understands medications and regiment? yes Patient understands to bring all medications to this visit? yes   Reviewed meds

## 2013-10-06 ENCOUNTER — Other Ambulatory Visit (HOSPITAL_COMMUNITY): Payer: Self-pay | Admitting: *Deleted

## 2013-10-06 DIAGNOSIS — I6529 Occlusion and stenosis of unspecified carotid artery: Secondary | ICD-10-CM

## 2013-10-11 ENCOUNTER — Encounter: Payer: Self-pay | Admitting: *Deleted

## 2013-10-11 ENCOUNTER — Other Ambulatory Visit: Payer: Self-pay | Admitting: *Deleted

## 2013-10-11 ENCOUNTER — Ambulatory Visit: Payer: Self-pay | Admitting: Rheumatology

## 2013-10-11 NOTE — Telephone Encounter (Signed)
error 

## 2013-10-12 ENCOUNTER — Encounter: Payer: Self-pay | Admitting: Cardiovascular Disease

## 2013-10-12 ENCOUNTER — Telehealth: Payer: Self-pay | Admitting: *Deleted

## 2013-10-12 ENCOUNTER — Ambulatory Visit (INDEPENDENT_AMBULATORY_CARE_PROVIDER_SITE_OTHER): Payer: Medicare Other | Admitting: Cardiovascular Disease

## 2013-10-12 ENCOUNTER — Encounter: Payer: Medicare Other | Admitting: Cardiovascular Disease

## 2013-10-12 ENCOUNTER — Encounter: Payer: Self-pay | Admitting: Nurse Practitioner

## 2013-10-12 VITALS — BP 130/80 | HR 73 | Ht 64.0 in | Wt 176.8 lb

## 2013-10-12 DIAGNOSIS — R55 Syncope and collapse: Secondary | ICD-10-CM

## 2013-10-12 DIAGNOSIS — Z5189 Encounter for other specified aftercare: Secondary | ICD-10-CM

## 2013-10-12 DIAGNOSIS — R002 Palpitations: Secondary | ICD-10-CM

## 2013-10-12 DIAGNOSIS — T671XXA Heat syncope, initial encounter: Secondary | ICD-10-CM

## 2013-10-12 DIAGNOSIS — T671XXD Heat syncope, subsequent encounter: Secondary | ICD-10-CM

## 2013-10-12 DIAGNOSIS — R0602 Shortness of breath: Secondary | ICD-10-CM

## 2013-10-12 DIAGNOSIS — I6529 Occlusion and stenosis of unspecified carotid artery: Secondary | ICD-10-CM

## 2013-10-12 MED ORDER — METOPROLOL TARTRATE 25 MG PO TABS: 12.5000 mg | ORAL_TABLET | Freq: Two times a day (BID) | ORAL | Status: DC

## 2013-10-12 NOTE — Patient Instructions (Addendum)
[place]  Your caregiver has ordered a Stress Test with nuclear imaging. The purpose of this test is to evaluate the blood supply to your heart muscle. This procedure is referred to as a "Non-Invasive Stress Test." This is because other than having an IV started in your vein, nothing is inserted or "invades" your body. Cardiac stress tests are done to find areas of poor blood flow to the heart by determining the extent of coronary artery disease (CAD). Some patients exercise on a treadmill, which naturally increases the blood flow to your heart, while others who are  unable to walk on a treadmill due to physical limitations have a pharmacologic/chemical stress agent called Lexiscan . This medicine will mimic walking on a treadmill by temporarily increasing your coronary blood flow.   Please note: these test may take anywhere between 2-4 hours to complete  PLEASE REPORT TO [facility] AT THE FIRST DESK WILL DIRECT YOU WHERE TO GO  Date of Procedure:_____________7/31/15________________________  Arrival Time for Procedure:____0715 am__________________________      PLEASE NOTIFY THE OFFICE AT LEAST 24 HOURS IN ADVANCE IF YOU ARE UNABLE TO KEEP YOUR APPOINTMENT.  [phone] AND  PLEASE NOTIFY NUCLEAR MEDICINE AT [facility] AT LEAST 24 HOURS IN ADVANCE IF YOU ARE UNABLE TO KEEP YOUR APPOINTMENT. [phone]  How to prepare for your Myoview test:  1. Do not eat or drink after midnight 2. No caffeine for 24 hours prior to test 3. No smoking 24 hours prior to test. 4. Your medication may be taken with water.  If your doctor stopped a medication because of this test, do not take that medication. 5. Ladies, please do not wear dresses.  Skirts or pants are appropriate. Please wear a short sleeve shirt. 6. No perfume, cologne or lotion. 7. Wear comfortable walking shoes. No heels!       Your physician has recommended that you wear an event monitor. Event monitors  are medical devices that record the heart's electrical activity. Doctors most often [place] these monitors to diagnose arrhythmias. Arrhythmias are problems with the speed or rhythm of the heartbeat. The monitor is a small, portable device. You can wear one while you do your normal daily activities. This is usually used to diagnose what is causing palpitations/syncope (passing out).    Your physician recommends that you schedule a follow-up appointment in: 2 months with Dr. Fletcher Anon

## 2013-10-12 NOTE — Telephone Encounter (Signed)
Needs to r/s nuclear stress test at [facility]. She has an appt with neurologist at [facility] at 9 am that morning. Please call her.

## 2013-10-13 NOTE — Telephone Encounter (Signed)
Patient Lindsay Morse scan rescheduled to Monday 10/18/13 at 10:30 am  Patient made aware

## 2013-10-14 DIAGNOSIS — R55 Syncope and collapse: Secondary | ICD-10-CM | POA: Insufficient documentation

## 2013-10-14 DIAGNOSIS — R0602 Shortness of breath: Secondary | ICD-10-CM | POA: Insufficient documentation

## 2013-10-14 NOTE — Progress Notes (Signed)
HPI  This is a pleasant 58 year old female who was referred from [facility] for evaluation of syncope. She has no previous cardiac history. She has known history of hypertension and hyperlipidemia. She is a previous smoker and quit smoking recently in May. She has family history of coronary artery disease but not prematurely. Recently, on July 20 she had a syncopal episode. She went to the bathroom and was trying to get a towel before she started showering. Goal of sudden, she had black vision followed by loss of consciousness. Her husband came quickly and he thinks he was out for about 1 minute. She was noted to have jerking and seizure-like movements. However, she was partially awake and responsive. She felt that her heart was going fast. Her husband checked her pulse with his blood pressure machine and it was 140 beats per minute.  Blood pressure was 187/102. There was no incontinence or tongue biting.  She was hospitalized at [facility] briefly. Labs were remarkable for borderline elevated troponin at 0.12. Echocardiogram showed normal LV systolic function with an ejection fraction of 28%, grade 1 diastolic dysfunction and no significant valvular abnormalities. ECG was normal. Telemetry showed no arrhythmia. She was seen by Dr. Valora Corporal from neurology who did not feel that this was a seizure episode. She is scheduled for outpatient followup with plans EEG.   she does report previous presyncopal episode about 2 years ago while she was driving. She denies chest pain but does complain of exertional dyspnea. She also reports frequent palpitations but not on a daily basis.  Allergies  Allergen Reactions  . Regadenoson Other (See Comments)    Trouble breathing  . Milnacipran     REACTION: tachycardia     Current Outpatient Prescriptions on File Prior to Visit  Medication Sig Dispense Refill  . albuterol (PROVENTIL HFA;VENTOLIN HFA) 108 (90 BASE) MCG/ACT inhaler Inhale 2 puffs into the lungs every 6 (six)  hours as needed for wheezing.  3 Inhaler  1  . aspirin 81 MG tablet Take 81 mg by mouth daily.      . budesonide-formoterol (SYMBICORT) 160-4.5 MCG/ACT inhaler Inhale 2 puffs into the lungs 2 (two) times daily.  3 Inhaler  3  . buPROPion (ZYBAN) 150 MG 12 hr tablet Take 150 mg by mouth 2 (two) times daily.      . celecoxib (CELEBREX) 200 MG capsule Take 200 mg by mouth 2 (two) times daily.      . cyclobenzaprine (FLEXERIL) 10 MG tablet Take 1 tablet (10 mg total) by mouth 3 (three) times daily as needed.  90 tablet  3  . diclofenac sodium (VOLTAREN) 1 % GEL Apply 4 g topically 4 (four) times daily as needed.  500 g  3  . estrogen, conjugated,-medroxyprogesterone (PREMPRO) 0.3-1.5 MG per tablet Take 1 tablet by mouth daily.  90 tablet  3  . lidocaine (LIDODERM) 5 % Place 2 patches onto the skin daily. Remove & Discard patch within 12 hours or as directed by MD  180 patch  3  . Multiple Vitamins-Minerals (CENTRUM SILVER PO) Take by mouth daily.        . nicotine (NICOTROL) 10 MG inhaler Inhale 1 puff into the lungs as needed for smoking cessation.  42 each  1  . pantoprazole (PROTONIX) 40 MG tablet Take 1 tablet (40 mg total) by mouth daily.  90 tablet  3  . rosuvastatin (CRESTOR) 10 MG tablet Take 1 tablet (10 mg total) by mouth daily.  90 tablet  3  . tiotropium (SPIRIVA HANDIHALER) 18 MCG inhalation capsule Place 1 capsule (18 mcg total) into inhaler and inhale daily.  90 capsule  3  . [DISCONTINUED] atorvastatin (LIPITOR) 40 MG tablet Take 40 mg by mouth daily.         No current facility-administered medications on file prior to visit.     Past Medical History  Diagnosis Date  . OA (osteoarthritis)     hips, back  . Irritable bowel syndrome   . HTN (hypertension)     now off all meds  . Fibromyalgia   . Depression   . Hyperlipidemia   . Suicidal behavior     >20 years ago  . COPD (chronic obstructive pulmonary disease)   . Personal history of colonic polyps   . GERD  (gastroesophageal reflux disease) 05/28/2013  . Carotid arterial disease     a. 06/2012 Carotid U/S: 40-50% bilat ICA stenosis, f/u 1 yr.  . Syncope and collapse     a. 01/2009 Echo: EF 55-60%, mildly dil LA.  Marland Kitchen Anxiety   . Depression      Past Surgical History  Procedure Laterality Date  . Carpal tunnel release  2007    right  . Cholecystectomy      Dr. Emilio Math  . Colonoscopy  2008    repeat every 5 years,polyps- [place], [place] (03/29/2005-Hyperplastic polyp)  . Upper gastrointestinal endoscopy  6/11    duodenitis,maloney dilated     Family History  Problem Relation Age of Onset  . Hypertension Mother   . Uterine cancer Mother     ischemic bowel; back pain, borderline DM  . Asthma Mother   . Hyperlipidemia Mother   . Coronary artery disease Father   . Heart attack Father   . Alcohol abuse Father   . Hyperlipidemia Father   . Coronary artery disease Sister     first MI at 35, 3 cardiac procedures  . Heart attack Sister   . Crohn's disease Sister   . Colon cancer Maternal Aunt   . Colon cancer Maternal Grandfather   . Thyroid disease Sister     dysfunction     History   Social History  . Marital Status: Married    Spouse Name: N/A    Number of Children: N/A  . Years of Education: N/A   Occupational History  . Unemployed     Sales   Social History Main Topics  . Smoking status: Former Smoker -- 0.50 packs/day for 43 years    Types: Cigarettes  . Smokeless tobacco: Former Systems developer    Quit date: 08/12/2013  . Alcohol Use: No  . Drug Use: No  . Sexual Activity: Not on file   Other Topics Concern  . Not on file   Social History Narrative   December moved from [facility]      Married, [place]      2 children 31,34 (step-40)      Regular exercise-yes, 30 minutes, twice/week              ROS A 10 point review of system was performed. It is negative other than that mentioned in the history of present illness.   PHYSICAL EXAM   BP 130/80  Pulse 73   Ht 5\' 4"  (1.626 m)  Wt 176 lb 12 oz (80.173 kg)  BMI 30.32 kg/m2  Constitutional: She is oriented to person, place, and time. She appears well-developed and well-nourished. No distress.  HENT: No nasal discharge.  Head: Normocephalic  and atraumatic.  Eyes: Pupils are equal and round. No discharge.  Neck: Normal range of motion. Neck supple. No JVD present. No thyromegaly present.  Cardiovascular: Normal rate, regular rhythm, normal heart sounds. Exam reveals no gallop and no friction rub. No murmur heard.  Pulmonary/Chest: Effort normal and breath sounds normal. No stridor. No respiratory distress. She has no wheezes. She has no rales. She exhibits no tenderness.  Abdominal: Soft. Bowel sounds are normal. She exhibits no distension. There is no tenderness. There is no rebound and no guarding.  Musculoskeletal: Normal range of motion. She exhibits no edema and no tenderness.  Neurological: She is alert and oriented to person, place, and time. Coordination normal.  Skin: Skin is warm and dry. No rash noted. She is not diaphoretic. No erythema. No pallor.  Psychiatric: She has a normal mood and affect. Her behavior is normal. Judgment and thought content normal.    EKG: normal sinus rhythm with no significant ST or T wave changes. Normal PR and QT intervals.    ASSESSMENT AND PLAN

## 2013-10-14 NOTE — Assessment & Plan Note (Signed)
The patient's episode is worrisome for an arrhythmia mediated syncope especially with previous presyncopal episode 2 years ago as well as frequent palpitations. She is not orthostatic today. Although she had myoclonic movements during the episode, there was no other features to suggest seizures. Baseline ECG is normal and echocardiogram showed no significant structural abnormalities. I recommend evaluation with a 30 day outpatient telemetry. Symptoms are not frequent enough to be captured with a Holter monitor.

## 2013-10-14 NOTE — Assessment & Plan Note (Signed)
The patient has exertional dyspnea. She had mildly elevated troponin recently after the syncopal episode. An ischemic etiology has to be ruled out. I requested a pharmacologic nuclear stress test for evaluation.

## 2013-10-15 DIAGNOSIS — R002 Palpitations: Secondary | ICD-10-CM

## 2013-10-18 ENCOUNTER — Ambulatory Visit: Payer: Self-pay | Admitting: Cardiovascular Disease

## 2013-10-18 ENCOUNTER — Other Ambulatory Visit: Payer: Self-pay

## 2013-10-18 DIAGNOSIS — R0602 Shortness of breath: Secondary | ICD-10-CM

## 2013-10-21 ENCOUNTER — Encounter (INDEPENDENT_AMBULATORY_CARE_PROVIDER_SITE_OTHER): Payer: Medicare Other

## 2013-10-21 DIAGNOSIS — I6529 Occlusion and stenosis of unspecified carotid artery: Secondary | ICD-10-CM

## 2013-10-22 ENCOUNTER — Encounter: Payer: Self-pay | Admitting: *Deleted

## 2013-11-16 ENCOUNTER — Telehealth: Payer: Self-pay | Admitting: *Deleted

## 2013-11-16 NOTE — Telephone Encounter (Signed)
Informed patient per Dr. Rockey Situ holter showed:  Normal sinus rhythm with periods of sinus tachycardia  Rare APC's  Patient verbalized understanding

## 2013-11-17 ENCOUNTER — Ambulatory Visit (INDEPENDENT_AMBULATORY_CARE_PROVIDER_SITE_OTHER): Payer: Medicare Other

## 2013-11-17 ENCOUNTER — Other Ambulatory Visit: Payer: Self-pay

## 2013-11-17 DIAGNOSIS — R55 Syncope and collapse: Secondary | ICD-10-CM

## 2013-11-17 DIAGNOSIS — R002 Palpitations: Secondary | ICD-10-CM

## 2013-12-14 ENCOUNTER — Ambulatory Visit: Payer: Medicare Other | Admitting: Cardiovascular Disease

## 2014-02-18 DIAGNOSIS — G40909 Epilepsy, unspecified, not intractable, without status epilepticus: Secondary | ICD-10-CM | POA: Insufficient documentation

## 2014-03-18 HISTORY — PX: CERVICAL SPINE SURGERY: SHX589

## 2014-07-09 NOTE — H&P (Signed)
PATIENT NAME:  Lindsay Morse, Lindsay Morse MR#:  [id number] DATE OF BIRTH:  1955/03/19  DATE OF ADMISSION:  10/04/2013  REFERRING PHYSICIAN: Dr. Lovena Le.  PRIMARY CARE PHYSICIAN: Eugenia Pancoast  CHIEF COMPLAINT: "I think I had a seizure."  HISTORY OF PRESENT ILLNESS: A 59 year old Caucasian female with a history of hyperlipidemia presenting with a syncopal episode that was stating "I think I had a seizure." She was in her normal state of health; however, today while she was in the restroom, had acute-onset vision change which she describes as small black dots in her vision with associated palpitations. Following this was an episode where she fell and had loss of consciousness for about 15 seconds in total. No head trauma. She yelled out for her husband prior to her actually falling. He entered the room, noticed that she was shaking and jerking motions; however, she was awake while she was having these jerking motions. No tongue biting. No loss of bowel or bladder function. No postictal state. She did have some jerking motions once again; however, she was awake whenever these were occurring. The jerking motions lasted for a few minutes. Currently no complaints.  REVIEW OF SYSTEMS: CONSTITUTIONAL: Denies fevers, chills, fatigue, weakness.  EYES: Denies blurred vision, double vision, eye pain.  EARS, NOSE, THROAT: Denies tinnitus, ear pain, hearing loss.  RESPIRATORY: Denies cough, wheeze, shortness of breath.  CARDIOVASCULAR: Denies chest pain. Positive for palpitations. Denies orthopnea, edema.  GASTROINTESTINAL: Denies nausea, vomiting, diarrhea, abdominal pain.  GENITOURINARY: Denies dysuria, hematuria.  ENDOCRINE: Denies nocturia or thyroid problems. HEMATOLOGY AND LYMPHATIC: Denies easy bruising and bleeding.  SKIN: Denies rashes or lesions.  MUSCULOSKELETAL: Denies pain in neck, back, shoulder, knees, hips, or arthritic symptoms.  NEUROLOGIC: Denies paralysis, paresthesias.  PSYCHIATRIC: Denies anxiety or  depressive symptoms.  Otherwise, full review of systems performed by me is negative.   PAST MEDICAL HISTORY: Hyperlipidemia.   SOCIAL HISTORY: Recently stopped smoking about 6 weeks ago. Denies any alcohol or drug usage.   FAMILY HISTORY: Positive for coronary artery disease.   ALLERGIES: No known drug allergies.   HOME MEDICATIONS: Include celecoxib 200 mg p.o. daily, Crestor 10 mg p.o. at bedtime, Flexeril 10 mg p.o. 3 times daily, Prempro 0.3/1.5 mg p.o. daily, bupropion 150 mg p.o. b.i.d.   PHYSICAL EXAMINATION:  VITAL SIGNS: Temperature 98.2, heart rate 120, respirations 20, blood pressure 183/103 on arrival, saturating 96% on room air, weight 78 kg, BMI 29.6.  GENERAL: Well-nourished, well-developed, Caucasian female appearing in no acute distress.  HEAD: Normocephalic, atraumatic.  EYES: Pupils equal, round, reactive to light. Extraocular muscles intact. No scleral icterus.  MOUTH: Moist mucous membranes. Dentition intact. No abscess noted. EARS, NOSE, THROAT: Clear without exudates. No external lesions.  NECK: Supple. No thyromegaly. No nodules. No JVD.  PULMONARY: Clear to auscultation bilaterally without wheezes, rales, or rhonchi. No use of accessory muscles. Good respiratory rate.  CHEST: Nontender to palpation.  CARDIOVASCULAR: S1, S2, regular rate and rhythm. No murmurs, rubs, or gallops. No edema. Pedal pulses 2+ bilaterally. GASTROINTESTINAL: Soft, nontender, nondistended. No masses. Positive bowel sounds. No hepatosplenomegaly.  MUSCULOSKELETAL: No swelling, clubbing, or edema. Range of motion full in all extremities.  NEUROLOGIC: Cranial nerves II through XII intact. No gross focal neurological deficits. Sensation intact. Reflexes intact.  SKIN: No ulceration, lesions, rash, cyanosis. Skin warm, dry. Turgor intact.  PSYCHIATRIC: Mood and affect within normal limits. Patient awake, alert, oriented x 3. Insight and judgment are intact.   LABORATORY DATA: EKG performed.  Sinus tach at  116. No ST or T wave abnormalities. Sodium 143, potassium 4.1, chloride 107, bicarbonate 27, BUN 17, creatinine 0.93, glucose 97. LFTs: Alkaline phosphatase of 137, otherwise within normal limits. Troponin I 0.07. WBC 11.4, hemoglobin 12.6, platelets of 240,000. Urinalysis negative for evidence of infection.  IMAGING: Chest x-ray performed, no acute cardiopulmonary process. CT, head, performed, no acute intracranial process.   ASSESSMENT AND PLAN: A 59 year old Caucasian female with a history of hyperlipidemia, presenting after a syncopal episode.  1.  Syncope. Admit to telemetry, observational status. Trend cardiac enzymes x 3. Will check a prolactin. Will also hold her Wellbutrin, as it does lower her seizure threshold, though what she is describing does not sound like seizure activity, as she was awake during her generalized shaking.  2.  Leukocytosis. No indication of infection at this time. No indication for antibiotics.  3.  Elevated troponin. Trend cardiac enzymes. Give aspirin now. If it elevates, we will start heparin.  4.  Hyperlipidemia. Continue statin therapy.  5.  Venous thromboembolism prophylaxis. Heparin subcutaneous.  CODE STATUS: Patient full code.   TIME SPENT: 45 minutes    ____________________________ Aaron Mose. Hower, MD dkh:sk D: 10/04/2013 01:58:21 ET T: 10/04/2013 02:13:09 ET JOB#: [id number]  cc: Aaron Mose. Hower, MD, <Dictator> DAVID Woodfin Ganja MD ELECTRONICALLY SIGNED 10/04/2013 20:29

## 2014-07-09 NOTE — Consult Note (Signed)
Referring Physician:  Lytle Butte   Primary Care Physician:  [facility], [address] [place], [place] [address], [place] [phone]  Reason for Consult: Admit Date: 03-Oct-2013  Chief Complaint: syncope   History of Present Illness: History of Present Illness:   59 yo RHD F presents to [facility] after passing out.  Prior to passing out, pt felt hot, had a racing heart then saw black dots.  there was no abnormal movements until the end when she remembers shaking on both sides.  She did not have incontinence or tongue biting.  She thinks the episode happened over a few seconds and she was not confused after.  She had an episode of flashing light 6 months prior but did not pass out with that episode.  ROS:  General denies complaints   HEENT no complaints   Lungs no complaints   Cardiac palpitations   GI no complaints   GU no complaints   Musculoskeletal no complaints   Extremities no complaints   Skin no complaints   Neuro no complaints   Endocrine no complaints   Psych no complaints   Past Medical/Surgical Hx:  Hiatal Hernia:   Palpitations:   Back Pain, Chronic:   Arthritis:   Hypertension:   Laparoscopy:   Tubal Ligation:   Carpal Tunnel Release:   Past Medical/ Surgical Hx:  Past Medical History reviewed by me as above   Past Surgical History reviewed by me as above   Home Medications: Medication Instructions Last Modified Date/Time  Metoprolol Tartrate 25 mg oral tablet 0.5 tab(s) orally 2 times a day 20-Jul-15 14:21  aspirin 81 mg oral delayed release tablet 1 tab(s) orally once a day 20-Jul-15 14:15  buPROPion 150 mg/12 hours oral tablet, extended release 1 tab(s) orally 2 times a day 20-Jul-15 02:02  Crestor 10 mg oral tablet 1 tab(s) orally once a day (at bedtime) 20-Jul-15 02:02  celecoxib 200 mg oral capsule 1 cap(s) orally once a day 20-Jul-15 02:02  Flexeril 10 milligram(s) orally 3 times a day 20-Jul-15 02:02   Prempro 0.3 mg-1.5 mg oral tablet 1 tab(s) orally once a day 20-Jul-15 02:02   Allergies:  No Known Allergies:   Allergies:  Allergies NKDA   Social/Family History: Employment Status: retired  Lives With: spouse  Living Arrangements: house  Social History: no tob, no EtOH, no illicits  Family History: no seizures or stroke   Vital Signs: **Vital Signs.:   20-Jul-15 13:00  Vital Signs Type Routine  Temperature Temperature (F) 97.9  Celsius 36.6  Temperature Source oral  Pulse Pulse 82  Respirations Respirations 18  Systolic BP Systolic BP 211  Diastolic BP (mmHg) Diastolic BP (mmHg) 90  Mean BP 113  Pulse Ox % Pulse Ox % 94  Pulse Ox Activity Level  At rest  Oxygen Delivery Room Air/ 21 %   Physical Exam: General: overweight, NAD  HEENT: normocephalic, sclera nonicteric, oropharynx clear  Neck: supple, no JVD, no bruits  Chest: CTA B, no wheezing, good movement  Cardiac: RRR, no murmurs, no edema, 2+ pulses  Extremities: no C/C/E, FROM   Neurologic Exam: Mental Status: alert and oriented x 3, normal speech and language, follows complex commands  Cranial Nerves: PERRLA, EOMI, nl VF, face symmetric, tongue midline, shoulder shrug equal  Motor Exam: 5/5 B normal, tone, no tremor  Deep Tendon Reflexes: 2+/4 B, plantars downgoing B, no Hoffman  Sensory Exam: pinprick, temperature, and vibration intact B  Coordination: FTN  and HTS WNL, nl RAM, nl gait   Lab Results: LabObservation:  20-Jul-15 14:25   OBSERVATION Reason for Test  Hepatic:  19-Jul-15 23:33   Bilirubin, Total 0.2  Bilirubin, Direct < 0.1 (Result(s) reported on 04 Oct 2013 at 01:06AM.)  Alkaline Phosphatase  137 (45-117 NOTE: New Reference Range 02/05/13)  SGPT (ALT) 32  SGOT (AST) 30  Total Protein, Serum 7.6  Albumin, Serum 4.1  Routine Chem:  19-Jul-15 23:33   Glucose, Serum 97  BUN 17  Creatinine (comp) 0.93  Sodium, Serum 143  Potassium, Serum 4.1  Chloride, Serum 107  CO2, Serum 27   Calcium (Total), Serum 8.6  Anion Gap 9  Osmolality (calc) 286  eGFR (African American) >60  eGFR (Non-African American) >60 (eGFR values <56m/min/1.73 m2 may be an indication of chronic kidney disease (CKD). Calculated eGFR is useful in patients with stable renal function. The eGFR calculation will not be reliable in acutely ill patients when serum creatinine is changing rapidly. It is not useful in  patients on dialysis. The eGFR calculation may not be applicable to patients at the low and high extremes of body sizes, pregnant women, and vegetarians.)  20-Jul-15 07:19   Result Comment Troponin - RESULTS VERIFIED BY REPEAT TESTING.  - Elevated troponin previously called @  - 0118 10/04/13 by [facility] - [facility].  Result(s) reported on 04 Oct 2013 at 08:12AM.  Cardiac:  20-Jul-15 07:19   Troponin I  0.11 (0.00-0.05 0.05 ng/mL or less: NEGATIVE  Repeat testing in 3-6 hrs  if clinically indicated. >0.05 ng/mL: POTENTIAL  MYOCARDIAL INJURY. Repeat  testing in 3-6 hrs if  clinically indicated. NOTE: An increase or decrease  of 30% or more on serial  testing suggests a  clinically important change)  Routine UA:  19-Jul-15 23:33   Color (UA) Straw  Clarity (UA) Clear  Glucose (UA) Negative  Bilirubin (UA) Negative  Ketones (UA) Negative  Specific Gravity (UA) 1.004  Blood (UA) Negative  pH (UA) 7.0  Protein (UA) Negative  Nitrite (UA) Negative  Leukocyte Esterase (UA) Negative (Result(s) reported on 04 Oct 2013 at 12:40AM.)  RBC (UA) <1 /HPF  WBC (UA) <1 /HPF  Bacteria (UA) 3+  Epithelial Cells (UA) 6 /HPF (Result(s) reported on 04 Oct 2013 at 12:40AM.)  Routine Coag:  19-Jul-15 23:33   D-Dimer, Quantitative 468 (INTERPRETATION <> Exclusion of Venous Thromboembolism (VTE) - OUTPATIENT ONLY       (Emergency Department or [place])             0-499 ng/ml (FEU)  : With a low to intermediate pretest                                  probability for VTE this test result                                   excludes the diagnosis of VTE.             > 499 ng/ml (FEU)  : VTE not excluded; additional work up                                  for VTE is required. <> Testing on Inpatients and Evaluation of Disseminated Intravascular  Coagulation (DIC)             Reference Range:  0-499 ng/ml (FEU))  20-Jul-15 07:19   Activated PTT (APTT) 32.7 (A HCT value >55% may artifactually increase the APTT. In one study, the increase was an average of 19%. Reference: "Effect on Routine and Special Coagulation Testing Values of Citrate Anticoagulant Adjustment in Patients with High HCT Values." American Journal of Clinical Pathology 2006;126:400-405.)  Prothrombin 12.1  INR 0.9 (INR reference interval applies to patients on anticoagulant therapy. A single INR therapeutic range for coumarins is not optimal for all indications; however, the suggested range for most indications is 2.0 - 3.0. Exceptions to the INR Reference Range may include: Prosthetic heart valves, acute myocardial infarction, prevention of myocardial infarction, and combinations of aspirin and anticoagulant. The need for a higher or lower target INR must be assessed individually. Reference: The Pharmacology and Management of the Vitamin K  antagonists: the seventh ACCP Conference on Antithrombotic and Thrombolytic Therapy. ZOXWR.6045 Sept:126 (3suppl): [id number]. A HCT value >55% may artifactually increase the PT.  In one study,  the increase was an average of 25%. Reference:  "Effect on Routine and Special Coagulation Testing Values of Citrate Anticoagulant Adjustment in Patients with High HCT Values." American Journal of Clinical Pathology 2006;126:400-405.)  Routine Hem:  19-Jul-15 23:33   WBC (CBC)  11.4  RBC (CBC) 3.92  Hemoglobin (CBC) 12.6  Hematocrit (CBC) 37.5  Platelet Count (CBC) 240  MCV 96  MCH 32.1  MCHC 33.6  RDW 12.0  Neutrophil % 68.6  Lymphocyte % 23.0  Monocyte % 7.2  Eosinophil %  0.7  Basophil % 0.5  Neutrophil #  7.8  Lymphocyte # 2.6  Monocyte # 0.8  Eosinophil # 0.1  Basophil # 0.1 (Result(s) reported on 04 Oct 2013 at 12:34AM.)   Radiology Results: CT:    20-Jul-15 00:34, CT Head Without Contrast  CT Head Without Contrast   REASON FOR EXAM:    syncope verses seizure  COMMENTS:       PROCEDURE: CT  - CT HEAD WITHOUT CONTRAST  - Oct 04 2013 12:34AM     CLINICAL DATA:  Syncope or seizure.    EXAM:  CT HEAD WITHOUT CONTRAST    TECHNIQUE:  Contiguous axial images were obtained from the base of the skull  through the vertex without intravenous contrast.    COMPARISON:  None available.  FINDINGS:  There is no acute intracranial hemorrhage or infarct. No mass lesion  or midline shift. Gray-white matter differentiation is well  maintained. Ventricles are normal in size without evidence of  hydrocephalus. CSF containing spaces are within normal limits. No  extra-axial fluid collection.    The calvarium is intact.    Orbital soft tissues are within normal limits.    Minimal mucosal thickening noted within the right sphenoid sinus.  Paranasal sinuses are otherwise clear. No mastoid effusion.    Scalp soft tissues are unremarkable.   IMPRESSION:  No acute intracranial abnormality.      Electronically Signed    By: Jeannine Boga M.D.    On: 10/04/2013 00:41         Verified By: Neomia Glass, M.D.,   Radiology Impression: Radiology Impression: CT of head personally reviewed by me and normal   Impression/Recommendations: Recommendations:   labs reviewed by me and normal notes reviewed by me abnormal   Convulsive syncope-  this episode was preceded by palpatations and does not sound like a seizure; however, episode  6 months ago is nondescript Elevated troponin-  no etiology EEG as outpatient within one month no anti-epileptics at this time no driving restrictions from Neuro standpoint now will sign off, please have pt f/u  with [facility] Neuro in 2 months  Electronic Signatures: Jamison Neighbor (MD)  (Signed 20-Jul-15 16:10)  Authored: REFERRING PHYSICIAN, Primary Care Physician, Consult, History of Present Illness, Review of Systems, PAST MEDICAL/SURGICAL HISTORY, HOME MEDICATIONS, ALLERGIES, Social/Family History, NURSING VITAL SIGNS, Physical Exam-, LAB RESULTS, RADIOLOGY RESULTS, Recommendations   Last Updated: 20-Jul-15 16:10 by Jamison Neighbor (MD)

## 2014-07-09 NOTE — Discharge Summary (Signed)
PATIENT NAME:  Lindsay Morse, Lindsay Morse MR#:  [id number] DATE OF BIRTH:  1955-05-28  DATE OF ADMISSION:  10/04/2013 DATE OF DISCHARGE:  10/04/2013  REASON FOR ADMISSION: As chief complaint, the patient thought that she may have had a seizure. She was admitted under diagnosis of syncopal episode and possible seizure.   DISCHARGE DIAGNOSES: 1.  Syncope.  2.  Anxiety.  3.  Depression.  4.  Hyperlipidemia.  5.  Mild leukocytosis.  6.  Mild elevation of troponin.  7.  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  CONSULTANTS: Valora Corporal from neurology.   IMPORTANT RESULTS: Glucose 97 and creatinine 0.93. Other electrolytes within normal limits. LFTs within normal limits, except for alkaline phosphatase is slightly elevated at 137. Troponins at 2300 on the 19th was 0.07, followed by 0.12, and then the last set of troponins was 0.11. White blood count 11.4 and hemoglobin 12.6. Other values are overall normal. Urinalysis: No red blood cells. No white blood cells. Prolactin level was decreased at 4.2.   Echocardiogram, Doppler, shows an ejection fraction of 60% to 65%, normal global left ventricular ejection fraction and impaired relaxation pattern with LV diastolic filling. Positive diastolic dysfunction. Normal right ventricular systemic pressures. No significant valvulopathy.  EKG showed no significant ST depressions or elevations.   MEDICATIONS AT DISCHARGE: Bupropion 150 mg every 12 hours, Crestor 10 mg daily, Celebrex 200 mg once a day, Flexeril 10 mg 3 times daily, Prempro 0.3/1.5 mg once a day, aspirin 81 mg daily.   DISCHARGE INSTRUCTIONS: Follow up with primary care physician, Dr. Lovena Le or new PCP. If the patient changes, we recommended Dr. Ronette Deter or Dr. Deborra Medina as the patient asked for a female physician. Follow up with [facility] cardiology for evaluation of possible arrhythmias. Stress test could be performed outpatient if found necessary by the cardiologist. For now the patient  will require Holter monitoring for what she is being referred out to.   HOSPITAL COURSE: A 59 year old female with history of depression, anxiety and hyperlipidemia who comes into the Emergency Department complaining of possible seizure. The patient was in her normal state of health without any significant abnormalities. She had her normal meals. She felt that she was hydrating herself properly. She was not exposed to extreme heat or anything that could trigger the episode. She was changing in her room and started feeling some palpitations. At the same time, she started looking out on her visual fields some small black dots. The patient then did not remember much about the incident, but apparently she fell down. She was found by her husband. Whenever her husband found her, she was shaking, not forcibly but she had some movement of her arms and legs and then starting to become really still and had some foam/saliva coming out of her mouth. Her husband helped her out and after she started shaking and having the jerking motions she regained consciousness and she was her normal self pretty much right away. There was no tongue biting, no bowel or bladder dysfunction at any time, and again the patient was not postictal. The patient was brought to the Emergency Department, was evaluated. Her heart rate was in the 120s. Her respirations were around 20. She was afebrile. Her oxygen saturation was normal on room air.   As per problems:  1.  Syncope. This could be a cardiogenic syncope. Echocardiogram was done on this hospitalization to evaluate any valvulopathy or significant anatomic abnormalities. The echocardiogram did not show any significant problems. The patient was kept under  telemetry and we were not able to capture any arrhythmias. With her history of palpitations prior to the episode, it is a consideration and a concern the patient might be having arrhythmias, likely atrial fibrillation. The patient will follow  up with cardiology at the [facility] clinic. A Holter monitor has been ordered for her.  2.  Since the patient had slight elevation of her troponin, she was put on a heparin drip, but then stopped after the troponins started to trend down. The elevation was minimal, up to 0.12. The patient did not have any significant signs of myocardial abnormalities. No hypokinesis at any level of the ventricles. The patient will be evaluated for possible stress test as an outpatient as well.   Overall she is doing okay. She felt comfortable going back home. She was well hydrated. There were no signs of infection despite the mild leukocytosis. Apparently, the patient was a heavy smoker and she quit within the last 3 weeks for what this could be a residual of her heavy smoking history.   The patient is hemodynamically stable, going home today. No changes in her medications. Evaluation by Dr. Valora Corporal, in his opinion, this is not an epileptic episode. He recommended just to rule it out with an EEG as an outpatient in a month, but he did not have any significant recommendations. Other than that, for non-epileptic, no driving restrictions. The patient is to follow up with [facility] neuro in 2 months.   TIME SPENT ON DISCHARGE: About 45 minutes.  ____________________________ Crenshaw Sink, MD rsg:sb D: 10/05/2013 07:40:01 ET T: 10/05/2013 08:04:29 ET JOB#: [id number]  cc: [place] Sink, MD, <Dictator> Garold Sheeler America Brown MD ELECTRONICALLY SIGNED 10/12/2013 0:22

## 2014-09-14 ENCOUNTER — Encounter: Payer: Self-pay | Admitting: Emergency Medicine

## 2014-09-14 ENCOUNTER — Emergency Department: Payer: No Typology Code available for payment source

## 2014-09-14 ENCOUNTER — Emergency Department
Admission: EM | Admit: 2014-09-14 | Discharge: 2014-09-14 | Disposition: A | Discharge status: Home / Self Care | Payer: No Typology Code available for payment source | Attending: Emergency Medicine | Admitting: Emergency Medicine

## 2014-09-14 DIAGNOSIS — I10 Essential (primary) hypertension: Secondary | ICD-10-CM | POA: Diagnosis not present

## 2014-09-14 DIAGNOSIS — S20211A Contusion of right front wall of thorax, initial encounter: Secondary | ICD-10-CM | POA: Diagnosis not present

## 2014-09-14 DIAGNOSIS — Z7952 Long term (current) use of systemic steroids: Secondary | ICD-10-CM | POA: Insufficient documentation

## 2014-09-14 DIAGNOSIS — Y92410 Unspecified street and highway as the place of occurrence of the external cause: Secondary | ICD-10-CM | POA: Diagnosis not present

## 2014-09-14 DIAGNOSIS — F329 Major depressive disorder, single episode, unspecified: Secondary | ICD-10-CM | POA: Diagnosis not present

## 2014-09-14 DIAGNOSIS — S8002XA Contusion of left knee, initial encounter: Secondary | ICD-10-CM | POA: Diagnosis not present

## 2014-09-14 DIAGNOSIS — S299XXA Unspecified injury of thorax, initial encounter: Secondary | ICD-10-CM | POA: Diagnosis present

## 2014-09-14 DIAGNOSIS — S8001XA Contusion of right knee, initial encounter: Secondary | ICD-10-CM | POA: Insufficient documentation

## 2014-09-14 DIAGNOSIS — Y9389 Activity, other specified: Secondary | ICD-10-CM | POA: Diagnosis not present

## 2014-09-14 DIAGNOSIS — Z7982 Long term (current) use of aspirin: Secondary | ICD-10-CM | POA: Diagnosis not present

## 2014-09-14 DIAGNOSIS — F419 Anxiety disorder, unspecified: Secondary | ICD-10-CM | POA: Insufficient documentation

## 2014-09-14 DIAGNOSIS — S1081XA Abrasion of other specified part of neck, initial encounter: Secondary | ICD-10-CM | POA: Insufficient documentation

## 2014-09-14 DIAGNOSIS — E785 Hyperlipidemia, unspecified: Secondary | ICD-10-CM | POA: Insufficient documentation

## 2014-09-14 DIAGNOSIS — Y998 Other external cause status: Secondary | ICD-10-CM | POA: Diagnosis not present

## 2014-09-14 DIAGNOSIS — Z87891 Personal history of nicotine dependence: Secondary | ICD-10-CM | POA: Diagnosis not present

## 2014-09-14 DIAGNOSIS — Z79899 Other long term (current) drug therapy: Secondary | ICD-10-CM | POA: Diagnosis not present

## 2014-09-14 MED ORDER — TRAMADOL HCL 50 MG PO TABS: 50.0000 mg | ORAL_TABLET | Freq: Four times a day (QID) | ORAL | Status: DC | PRN

## 2014-09-14 MED ORDER — METHOCARBAMOL 500 MG PO TABS
ORAL_TABLET | ORAL | Status: AC
  Administered 2014-09-14: 500 mg via ORAL
  Filled 2014-09-14: qty 1

## 2014-09-14 MED ORDER — TRAMADOL HCL 50 MG PO TABS
ORAL_TABLET | ORAL | Status: AC
  Administered 2014-09-14: 50 mg via ORAL
  Filled 2014-09-14: qty 1

## 2014-09-14 MED ORDER — METHOCARBAMOL 1000 MG/10ML IJ SOLN: 500.0000 mg | Freq: Once | INTRAMUSCULAR | Status: DC

## 2014-09-14 MED ORDER — METHOCARBAMOL 500 MG PO TABS
500.0000 mg | ORAL_TABLET | Freq: Once | ORAL | Status: AC
  Administered 2014-09-14: 500 mg via ORAL

## 2014-09-14 MED ORDER — TRAMADOL HCL 50 MG PO TABS
50.0000 mg | ORAL_TABLET | Freq: Once | ORAL | Status: AC
  Administered 2014-09-14: 50 mg via ORAL

## 2014-09-14 MED ORDER — METHOCARBAMOL 500 MG PO TABS: 500.0000 mg | ORAL_TABLET | Freq: Four times a day (QID) | ORAL | Status: DC
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
## 2014-09-27 ENCOUNTER — Other Ambulatory Visit (HOSPITAL_COMMUNITY): Payer: Self-pay | Admitting: Chiropractic Medicine

## 2014-09-27 ENCOUNTER — Ambulatory Visit (HOSPITAL_COMMUNITY)
Admission: RE | Admit: 2014-09-27 | Discharge: 2014-09-27 | Disposition: A | Discharge status: Home / Self Care | Payer: No Typology Code available for payment source | Source: Ambulatory Visit | Attending: Chiropractic Medicine | Admitting: Chiropractic Medicine

## 2014-09-27 DIAGNOSIS — I6529 Occlusion and stenosis of unspecified carotid artery: Secondary | ICD-10-CM | POA: Insufficient documentation

## 2014-09-27 DIAGNOSIS — M4312 Spondylolisthesis, cervical region: Secondary | ICD-10-CM | POA: Diagnosis not present

## 2014-09-27 DIAGNOSIS — M47816 Spondylosis without myelopathy or radiculopathy, lumbar region: Secondary | ICD-10-CM | POA: Diagnosis not present

## 2014-09-27 DIAGNOSIS — R52 Pain, unspecified: Secondary | ICD-10-CM | POA: Diagnosis present

## 2014-09-27 DIAGNOSIS — M5030 Other cervical disc degeneration, unspecified cervical region: Secondary | ICD-10-CM | POA: Diagnosis not present

## 2014-09-27 DIAGNOSIS — M5134 Other intervertebral disc degeneration, thoracic region: Secondary | ICD-10-CM | POA: Insufficient documentation
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
## 2014-09-28 ENCOUNTER — Other Ambulatory Visit (HOSPITAL_COMMUNITY): Payer: Self-pay | Admitting: Chiropractic Medicine

## 2014-09-28 DIAGNOSIS — R071 Chest pain on breathing: Secondary | ICD-10-CM

## 2014-09-28 DIAGNOSIS — S20219A Contusion of unspecified front wall of thorax, initial encounter: Secondary | ICD-10-CM

## 2014-10-03 ENCOUNTER — Ambulatory Visit (HOSPITAL_COMMUNITY): Payer: Medicare Other

## 2014-10-10 ENCOUNTER — Encounter (HOSPITAL_COMMUNITY): Payer: Self-pay

## 2014-10-10 ENCOUNTER — Ambulatory Visit (HOSPITAL_COMMUNITY)
Admission: RE | Admit: 2014-10-10 | Discharge: 2014-10-10 | Disposition: A | Discharge status: Home / Self Care | Payer: No Typology Code available for payment source | Source: Ambulatory Visit | Attending: Chiropractic Medicine | Admitting: Chiropractic Medicine

## 2014-10-10 DIAGNOSIS — R918 Other nonspecific abnormal finding of lung field: Secondary | ICD-10-CM | POA: Insufficient documentation

## 2014-10-10 DIAGNOSIS — I2510 Atherosclerotic heart disease of native coronary artery without angina pectoris: Secondary | ICD-10-CM | POA: Diagnosis not present

## 2014-10-10 DIAGNOSIS — R071 Chest pain on breathing: Secondary | ICD-10-CM

## 2014-10-10 DIAGNOSIS — R079 Chest pain, unspecified: Secondary | ICD-10-CM | POA: Insufficient documentation

## 2014-10-10 DIAGNOSIS — S20219A Contusion of unspecified front wall of thorax, initial encounter: Secondary | ICD-10-CM

## 2014-10-10 DIAGNOSIS — D3501 Benign neoplasm of right adrenal gland: Secondary | ICD-10-CM | POA: Insufficient documentation

## 2014-10-10 MED ORDER — IOHEXOL 300 MG/ML  SOLN
100.0000 mL | Freq: Once | INTRAMUSCULAR | Status: AC | PRN
  Administered 2014-10-10: 80 mL via INTRAVENOUS

## 2015-01-03 ENCOUNTER — Other Ambulatory Visit: Payer: Self-pay | Admitting: Internal Medicine

## 2015-01-03 DIAGNOSIS — Z1231 Encounter for screening mammogram for malignant neoplasm of breast: Secondary | ICD-10-CM

## 2015-01-10 ENCOUNTER — Ambulatory Visit: Payer: Medicare Other

## 2015-01-11 ENCOUNTER — Encounter: Payer: Self-pay | Admitting: Internal Medicine

## 2015-01-12 ENCOUNTER — Other Ambulatory Visit (HOSPITAL_COMMUNITY): Payer: Self-pay | Admitting: Chiropractic Medicine

## 2015-01-12 DIAGNOSIS — M62838 Other muscle spasm: Secondary | ICD-10-CM

## 2015-01-12 DIAGNOSIS — M542 Cervicalgia: Secondary | ICD-10-CM

## 2015-01-12 DIAGNOSIS — R200 Anesthesia of skin: Secondary | ICD-10-CM

## 2015-01-20 ENCOUNTER — Ambulatory Visit
Admission: RE | Admit: 2015-01-20 | Discharge: 2015-01-20 | Disposition: A | Discharge status: Home / Self Care | Payer: Medicare Other | Source: Ambulatory Visit | Attending: Internal Medicine | Admitting: Internal Medicine

## 2015-01-20 DIAGNOSIS — Z1231 Encounter for screening mammogram for malignant neoplasm of breast: Secondary | ICD-10-CM | POA: Insufficient documentation

## 2015-01-24 ENCOUNTER — Ambulatory Visit (HOSPITAL_COMMUNITY)
Admission: RE | Admit: 2015-01-24 | Discharge: 2015-01-24 | Disposition: A | Discharge status: Home / Self Care | Payer: No Typology Code available for payment source | Source: Ambulatory Visit | Attending: Chiropractic Medicine | Admitting: Chiropractic Medicine

## 2015-01-24 DIAGNOSIS — R200 Anesthesia of skin: Secondary | ICD-10-CM | POA: Diagnosis not present

## 2015-01-24 DIAGNOSIS — M479 Spondylosis, unspecified: Secondary | ICD-10-CM | POA: Diagnosis not present

## 2015-01-24 DIAGNOSIS — G968 Other specified disorders of central nervous system: Secondary | ICD-10-CM | POA: Insufficient documentation

## 2015-01-24 DIAGNOSIS — M62838 Other muscle spasm: Secondary | ICD-10-CM | POA: Insufficient documentation

## 2015-01-24 DIAGNOSIS — M542 Cervicalgia: Secondary | ICD-10-CM

## 2015-06-02 ENCOUNTER — Encounter: Payer: Self-pay | Admitting: Gastroenterology

## 2015-06-12 ENCOUNTER — Encounter: Payer: Self-pay | Admitting: Gastroenterology

## 2015-08-15 ENCOUNTER — Encounter

## 2015-08-23 ENCOUNTER — Inpatient Hospital Stay: Admit: 2015-08-23 | Payer: MEDICARE | Attending: Neurology | Primary: Family Medicine

## 2015-08-23 DIAGNOSIS — R413 Other amnesia: Secondary | ICD-10-CM

## 2015-08-25 NOTE — Procedures (Signed)
[place]  Electroencephalogram  NAME:  Hannah [name], Hannah [name]   DATE: 08/23/2015  EEG#: [id number]  DOB: 1955/11/05  MR#    [id number]  ROOM:  EEG  ACCT#  [id number]  SEX: F  REFERRING PHYSICIAN: Wilberth Damon R Fortune Torosian          EEG DESCRIPTION:  The EEG is performed by applying international 10/20 standard electrode placement system and using Cadwell 16-channel digital EEG recording machine.    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  She had an episode of grand mal seizure in 12/2014 and since then, she has been complaining of memory impairment.    The tracing begins with the patient awake and the predominant posterior background rhythm consists of bilaterally symmetrical, well-formed, 8 to 9 Hz alpha rhythm.  Occasional eye movements and muscle artifacts are seen.  Other than attenuation of the background activity during drowsiness, no EEG changes of deeper stages of sleep are seen.  No consistent focal slowing is seen.  Occasional infrequent sharp activity is seen in the left temporal region.  No other abnormal paroxysmal discharges are seen.    Hyperventilation is not performed.    Photic stimulation produces good bisynchronous driving response.    IMPRESSION:  This is an abnormal EEG due to occasional infrequent sharp activity seen in the left temporal region.  These types of abnormal findings can be seen in patients with localization-related seizure disorder.  Left hemispheric structural lesion needs to be ruled out.  Clinical correlation is recommended.      ___________________  Clifton Custard MD  Dictated By: .   Earl Gala  D:08/25/2015 09:18:06  T: 08/25/2015 11:43:52  [id number]

## 2015-08-25 NOTE — Procedures (Signed)
[place]  Electroencephalogram  NAME:  Hannah [name], Hannah [name]   DATE: 08/23/2015  EEG#: [id number]  DOB: 1955-10-18  MR#    [id number]  ROOM:  EEG  ACCT#  [id number]  SEX: F  REFERRING PHYSICIAN: Rozlynn Lippold R Shravan Salahuddin          EEG DESCRIPTION:  The EEG is performed by applying international 10/20 standard electrode placement system and using Cadwell 16-channel digital EEG recording machine.    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  She had an episode of grand mal seizure in 12/2014 and since then, she has been complaining of memory impairment.    The tracing begins with the patient awake and the predominant posterior background rhythm consists of bilaterally symmetrical, well-formed, 8 to 9 Hz alpha rhythm.  Occasional eye movements and muscle artifacts are seen.  Other than attenuation of the background activity during drowsiness, no EEG changes of deeper stages of sleep are seen.  No consistent focal slowing is seen.  Occasional infrequent sharp activity is seen in the left temporal region.  No other abnormal paroxysmal discharges are seen.    Hyperventilation is not performed.    Photic stimulation produces good bisynchronous driving response.    IMPRESSION:  This is an abnormal EEG due to occasional infrequent sharp activity seen in the left temporal region.  These types of abnormal findings can be seen in patients with localization-related seizure disorder.  Left hemispheric structural lesion needs to be ruled out.  Clinical correlation is recommended.      ___________________  Clifton Custard MD  Dictated By: .   Earl Gala  D:08/25/2015 09:18:06  T: 08/25/2015 11:43:52  [id number]

## 2015-10-11 ENCOUNTER — Encounter

## 2015-12-25 ENCOUNTER — Encounter

## 2015-12-27 ENCOUNTER — Other Ambulatory Visit: Payer: Self-pay | Admitting: Internal Medicine

## 2015-12-28 ENCOUNTER — Encounter

## 2016-01-03 ENCOUNTER — Inpatient Hospital Stay: Payer: MEDICARE | Attending: Family Medicine | Primary: Family Medicine

## 2016-01-03 ENCOUNTER — Encounter

## 2016-01-03 ENCOUNTER — Inpatient Hospital Stay: Admit: 2016-01-03 | Payer: MEDICARE | Attending: Family Medicine | Primary: Family Medicine

## 2016-01-03 DIAGNOSIS — N6310 Unspecified lump in the right breast, unspecified quadrant: Secondary | ICD-10-CM

## 2016-01-03 DIAGNOSIS — N63 Unspecified lump in unspecified breast: Secondary | ICD-10-CM

## 2016-02-19 ENCOUNTER — Inpatient Hospital Stay: Payer: MEDICARE | Attending: Family Medicine | Primary: Family Medicine

## 2016-02-21 ENCOUNTER — Ambulatory Visit

## 2016-02-21 ENCOUNTER — Encounter

## 2016-02-21 ENCOUNTER — Inpatient Hospital Stay: Admit: 2016-02-21 | Payer: MEDICARE | Attending: Family Medicine | Primary: Family Medicine

## 2016-02-21 DIAGNOSIS — M79604 Pain in right leg: Secondary | ICD-10-CM

## 2016-04-22 ENCOUNTER — Ambulatory Visit

## 2016-04-22 ENCOUNTER — Inpatient Hospital Stay: Admit: 2016-04-22 | Payer: MEDICARE | Attending: Family Medicine | Primary: Family Medicine

## 2016-04-22 ENCOUNTER — Encounter

## 2016-04-22 ENCOUNTER — Inpatient Hospital Stay: Admit: 2016-04-22 | Payer: MEDICARE | Primary: Family Medicine

## 2016-04-22 DIAGNOSIS — M79604 Pain in right leg: Secondary | ICD-10-CM

## 2016-04-22 LAB — LIPID PANEL
CHOL/HDL Ratio: 2 Ratio (ref 0.0–4.4)
Cholesterol, total: 125 mg/dl — ABNORMAL LOW (ref 140–199)
HDL Cholesterol: 64 mg/dl (ref 40–96)
LDL, calculated: 31 mg/dl (ref 0–130)
Triglyceride: 149 mg/dl (ref 29–150)

## 2016-04-22 LAB — METABOLIC PANEL, COMPREHENSIVE
ALT (SGPT): 30 U/L (ref 12–78)
AST (SGOT): 21 U/L (ref 15–37)
Albumin: 4.2 gm/dl (ref 3.4–5.0)
Alk. phosphatase: 103 U/L (ref 45–117)
BUN: 14 mg/dl (ref 7–25)
Bilirubin, total: 0.3 mg/dl (ref 0.2–1.0)
CO2: 28 mEq/L (ref 21–32)
Calcium: 8.9 mg/dl (ref 8.5–10.1)
Chloride: 103 mEq/L (ref 98–107)
Creatinine: 0.9 mg/dl (ref 0.6–1.3)
GFR est AA: >60
GFR est non-AA: >60
Glucose: 64 mg/dl — ABNORMAL LOW (ref 74–106)
Potassium: 4 mEq/L (ref 3.5–5.1)
Protein, total: 7.6 gm/dl (ref 6.4–8.2)
Sodium: 138 mEq/L (ref 136–145)

## 2016-04-22 LAB — CBC WITH AUTOMATED DIFF
BASOPHILS: 0.6 % (ref 0–3)
EOSINOPHILS: 0.9 % (ref 0–5)
HCT: 37.2 % (ref 37.0–50.0)
HGB: 12.5 gm/dl — ABNORMAL LOW (ref 13.0–17.2)
IMMATURE GRANULOCYTES: 0.1 % (ref 0.0–3.0)
LYMPHOCYTES: 45 % (ref 28–48)
MCH: 31 pg (ref 25.4–34.6)
MCHC: 33.6 gm/dl (ref 30.0–36.0)
MCV: 92.3 fL (ref 80.0–98.0)
MONOCYTES: 8.9 % (ref 1–13)
MPV: 10 fL (ref 6.0–10.0)
NEUTROPHILS: 44.5 % (ref 34–64)
NRBC: 0 (ref 0–0)
PLATELET: 256 10*3/uL (ref 140–450)
RBC: 4.03 M/uL (ref 3.60–5.20)
RDW-SD: 39.5 (ref 36.4–46.3)
WBC: 8.1 10*3/uL (ref 4.0–11.0)

## 2016-04-22 LAB — CREATININE, UR, RANDOM
Creatinine, urine random: 122 mg/dl (ref 30.0–125.0)
Microalbumin/Creat ratio (mg/g creat): 9 mg/g (ref 0–30)

## 2016-04-22 LAB — CKMB PROFILE
CK - MB: 1.1 ng/ml (ref 0.0–3.6)
CK-MB Index: 0.6 % (ref 0.0–4.9)
CK: 175 U/L (ref 26–192)

## 2016-04-22 LAB — PROTHROMBIN TIME + INR
INR: 0.9 (ref 0.1–1.1)
Prothrombin time: 10.5 seconds (ref 10.2–12.9)

## 2016-04-22 LAB — MICROALBUMIN, UR, RAND: Microalbumin,urine random: 10.5 mg/L (ref 0.0–29.9)

## 2016-04-22 LAB — HEMOGLOBIN A1C W/O EAG: Hemoglobin A1c: 5.8 % (ref 4.8–6.0)

## 2016-04-25 LAB — LEVETIRACETAM (KEPPRA): LEVETIRACETAM, S: 17.9 ug/mL

## 2016-10-17 ENCOUNTER — Encounter

## 2016-10-29 ENCOUNTER — Inpatient Hospital Stay: Admit: 2016-10-29 | Payer: MEDICARE | Attending: Pulmonary Disease | Primary: Family Medicine

## 2016-10-29 DIAGNOSIS — J8410 Pulmonary fibrosis, unspecified: Secondary | ICD-10-CM

## 2016-11-26 ENCOUNTER — Inpatient Hospital Stay: Admit: 2016-11-26 | Discharge: 2016-11-26 | Payer: MEDICARE | Primary: Family Medicine

## 2016-11-26 DIAGNOSIS — I7090 Unspecified atherosclerosis: Secondary | ICD-10-CM

## 2016-11-26 LAB — METABOLIC PANEL, BASIC
Anion gap: 7 mmol/L (ref 5–15)
BUN: 20 mg/dl (ref 7–25)
CO2: 29 mEq/L (ref 21–32)
Calcium: 9.1 mg/dl (ref 8.5–10.1)
Chloride: 104 mEq/L (ref 98–107)
Creatinine: 0.9 mg/dl (ref 0.6–1.3)
GFR est AA: >60
GFR est non-AA: >60
Glucose: 117 mg/dl — ABNORMAL HIGH (ref 74–106)
Potassium: 4.3 mEq/L (ref 3.5–5.1)
Sodium: 140 mEq/L (ref 136–145)

## 2016-11-26 LAB — CBC WITH AUTOMATED DIFF
BASOPHILS: 0.4 % (ref 0–3)
EOSINOPHILS: 1.8 % (ref 0–5)
HCT: 38.2 % (ref 37.0–50.0)
HGB: 12.3 gm/dl — ABNORMAL LOW (ref 13.0–17.2)
IMMATURE GRANULOCYTES: 0.3 % (ref 0.0–3.0)
LYMPHOCYTES: 34.4 % (ref 28–48)
MCH: 30.8 pg (ref 25.4–34.6)
MCHC: 32.2 gm/dl (ref 30.0–36.0)
MCV: 95.7 fL (ref 80.0–98.0)
MONOCYTES: 8.6 % (ref 1–13)
MPV: 10.5 fL — ABNORMAL HIGH (ref 6.0–10.0)
NEUTROPHILS: 54.5 % (ref 34–64)
NRBC: 0 (ref 0–0)
PLATELET: 240 10*3/uL (ref 140–450)
RBC: 3.99 M/uL (ref 3.60–5.20)
RDW-SD: 41.6 (ref 36.4–46.3)
WBC: 7.1 10*3/uL (ref 4.0–11.0)

## 2016-11-26 LAB — CK: CK: 206 U/L — ABNORMAL HIGH (ref 26–192)

## 2016-11-26 LAB — CK-MB,QT: CK - MB: 1.7 ng/ml (ref 0.0–3.6)

## 2016-11-26 LAB — TROPONIN I: Troponin-I: <0.015 ng/ml (ref 0.000–0.045)

## 2016-11-27 ENCOUNTER — Encounter

## 2016-11-27 LAB — EKG 12-LEAD
Atrial Rate: 81 {beats}/min
Diagnosis: NORMAL
P Axis: 74 degrees
P-R Interval: 132 ms
Q-T Interval: 392 ms
QRS Duration: 78 ms
QTc Calculation (Bazett): 455 ms
R Axis: 38 degrees
T Axis: 41 degrees
Ventricular Rate: 81 {beats}/min

## 2016-11-27 LAB — EKG, 12 LEAD, INITIAL
Atrial Rate: 81 {beats}/min
Calculated P Axis: 74 degrees
Calculated R Axis: 38 degrees
Calculated T Axis: 41 degrees
Diagnosis: NORMAL
P-R Interval: 132 ms
Q-T Interval: 392 ms
QRS Duration: 78 ms
QTC Calculation (Bezet): 455 ms
Ventricular Rate: 81 {beats}/min

## 2016-12-05 NOTE — Other (Signed)
[place]    How to prepare:    Nothing to eat or drink after midnight the night before surgery, unless otherwise specified.  This includes gum and mints.  You may brush your teeth in the morning, however do not swallow any water.  No Smoking after midnight and cigarette smoking should be reduced to a minimum a few days prior to surgery.  Stop taking aspirin and aspirin products including Motrin, Advil, Ibuprofen, Aleve, Excedrin, BC Powder, fish oil, vitamins and herbals 2 weeks prior to surgery or as directed by your physician.  Tylenol/Acetaminophen are okay.  Do not take any medication the morning of your procedure without your physician's approval.  Please speak with the Pre-Admission testing nurse at [phone] regarding specific instructions about your medications.  Please remove all jewelry and body piercings and leave all valuables at home.  Remove nail polish and contact lenses.  You MUST make arrangements for a responsible adult to take you home after your surgery, analgesia or sedation.  It is strongly suggested that a responsible adult stay with you during the first 24 hours.  Please follow your surgeon's instructions regarding the time you need to arrive at [facility].  If you are late [place] due to any unforeseen circumstance or your are ill the morning of surgery and need to cancel or be delayed, please call [facility] at [phone] or [phone]  If you need to cancel your surgery prior to the day of surgery, call your surgeon.      What to bring with you:    Paperwork from your doctor's office that you have been given.  Insurance cards, a picture ID and a method to pay your insurance co-pay.  Wear comfortable, loose fitting clothing that will be easy for you to put back on after surgery.  If you have asthma and use inhalers, please bring them with you.      I have reviewed the above instructions:               Patient: Hannah [name]  Date:     December 05, 2016 Time:   9:27 AM       RN:  Venancio Poisson, LPN  Date:     December 05, 2016 Time:   9:27 AM

## 2016-12-06 ENCOUNTER — Inpatient Hospital Stay: Admit: 2016-12-06 | Payer: MEDICARE | Primary: Family Medicine

## 2016-12-06 ENCOUNTER — Inpatient Hospital Stay: Payer: MEDICARE

## 2016-12-06 DIAGNOSIS — N6310 Unspecified lump in the right breast, unspecified quadrant: Secondary | ICD-10-CM

## 2016-12-06 MED ORDER — LACTATED RINGERS IV
INTRAVENOUS | Status: DC
Start: 2016-12-06 — End: 2016-12-06
  Administered 2016-12-06: 13:00:00 via INTRAVENOUS

## 2016-12-06 MED ORDER — KETAMINE 50 MG/ML IJ SOLN
50 mg/mL | INTRAMUSCULAR | Status: DC | PRN
Start: 2016-12-06 — End: 2016-12-06
  Administered 2016-12-06 (×2): via INTRAVENOUS

## 2016-12-06 MED ORDER — LIDOCAINE-EPINEPHRINE 1 %-1:100,000 IJ SOLN
1 %-:00,000 | INTRAMUSCULAR | Status: DC | PRN
Start: 2016-12-06 — End: 2016-12-06
  Administered 2016-12-06: 15:00:00 via SUBCUTANEOUS

## 2016-12-06 MED ORDER — CEFAZOLIN 1 GRAM SOLUTION FOR INJECTION
1 gram | INTRAMUSCULAR | Status: DC | PRN
Start: 2016-12-06 — End: 2016-12-06
  Administered 2016-12-06: 14:00:00 via INTRAVENOUS

## 2016-12-06 MED ORDER — LIDOCAINE (PF) 20 MG/ML (2 %) IJ SOLN
20 mg/mL (2 %) | INTRAMUSCULAR | Status: DC | PRN
Start: 2016-12-06 — End: 2016-12-06
  Administered 2016-12-06: 14:00:00 via INTRAVENOUS

## 2016-12-06 MED ORDER — HYDROCODONE-ACETAMINOPHEN 5 MG-325 MG TAB
5-325 mg | ORAL_TABLET | ORAL | 0 refills | Status: DC | PRN
Start: 2016-12-06 — End: 2018-03-20

## 2016-12-06 MED ORDER — DEXAMETHASONE SODIUM PHOSPHATE 4 MG/ML IJ SOLN
4 mg/mL | INTRAMUSCULAR | Status: DC | PRN
Start: 2016-12-06 — End: 2016-12-06
  Administered 2016-12-06: 15:00:00 via INTRAVENOUS

## 2016-12-06 MED ORDER — PROPOFOL 10 MG/ML IV EMUL
10 mg/mL | INTRAVENOUS | Status: DC | PRN
Start: 2016-12-06 — End: 2016-12-06
  Administered 2016-12-06: 14:00:00 via INTRAVENOUS

## 2016-12-06 MED ORDER — MIDAZOLAM 1 MG/ML IJ SOLN
1 mg/mL | INTRAMUSCULAR | Status: DC | PRN
Start: 2016-12-06 — End: 2016-12-06
  Administered 2016-12-06: 14:00:00 via INTRAVENOUS

## 2016-12-06 MED ORDER — ONDANSETRON (PF) 4 MG/2 ML INJECTION
4 mg/2 mL | INTRAMUSCULAR | Status: DC | PRN
Start: 2016-12-06 — End: 2016-12-06
  Administered 2016-12-06: 14:00:00 via INTRAVENOUS

## 2016-12-06 MED ORDER — LIDOCAINE (PF) 10 MG/ML (1 %) IJ SOLN
10 mg/mL (1 %) | Freq: Once | INTRAMUSCULAR | Status: DC | PRN
Start: 2016-12-06 — End: 2016-12-06

## 2016-12-06 MED ORDER — PROPOFOL 10 MG/ML IV EMUL
10 mg/mL | INTRAVENOUS | Status: DC | PRN
Start: 2016-12-06 — End: 2016-12-06
  Administered 2016-12-06 (×2): via INTRAVENOUS

## 2016-12-06 MED ORDER — ACETAMINOPHEN 500 MG TAB
500 mg | Freq: Once | ORAL | Status: AC
Start: 2016-12-06 — End: 2016-12-06
  Administered 2016-12-06: 13:00:00 via ORAL

## 2016-12-06 MED ORDER — BUPIVACAINE (PF) 0.25 % (2.5 MG/ML) IJ SOLN
0.25 % (2.5 mg/mL) | INTRAMUSCULAR | Status: DC | PRN
Start: 2016-12-06 — End: 2016-12-06
  Administered 2016-12-06: 15:00:00 via SUBCUTANEOUS

## 2016-12-06 MED FILL — LACTATED RINGERS IV: INTRAVENOUS | Qty: 1000

## 2016-12-06 MED FILL — XYLOCAINE-MPF 10 MG/ML (1 %) INJECTION SOLUTION: 10 mg/mL (1 %) | INTRAMUSCULAR | Qty: 0.5

## 2016-12-06 MED FILL — MAPAP EXTRA STRENGTH 500 MG TABLET: 500 mg | ORAL | Qty: 2

## 2016-12-06 NOTE — Op Note (Signed)
[facility]  Ambulatory Surgery  NAME:  [place], [place]  SEX:   F  SURGERY DATE: 12/06/2016  DOB: 1955-08-11  MR#    [id number]  LOCATION: SURG  ACCT#  [id number]        SURGEON:  Marni Griffon, M.D.    PROCEDURE:  Excision of 2 breast masses on the right.    PREOPERATIVE DIAGNOSIS:  Two breast masses on the right.    POSTOPERATIVE DIAGNOSIS:  Two breast masses on the right.    ANESTHESIA:  General supplemented with 0.5% Marcaine with epinephrine.    ESTIMATED BLOOD LOSS:  Minimal.    COMPLICATIONS:  None.    INDICATIONS:  The patient is a 61 year old female with 2 suspicious masses in the right breast.    FINDINGS AT OPERATION:  Two masses.    PROCEDURE IN DETAIL:  The patient was taken to the operating room, placed in supine position.  After adequate anesthesia was achieved, the skin of the right breast is prepped and draped in the usual sterile fashion.  Local anesthetic is injected into the area that was marked in the preoperative holding area with the patient's assistance indicating the masses.  Once the area was anesthetized, an incision is made, dissection carried through subcutaneous tissue with the cautery.  The mass is identified, grasped and using the cautery, the mass is completely excised, handed off the field.  In a similar fashion, the other mass is anesthetized, incised and completely excised.  The incisions are then closed using interrupted 3-0 Vicryl in the deep tissues and running 4-0 Monocryl in the skin.  Dermabond is placed on the wound.  The patient tolerated the procedure well and was brought to the recovery area in stable condition.      ___________________  Marni Griffon M.D.  Dictated By:.   MLT  D:01/14/2017 14:27:15  T: 01/14/2017 15:55:27  [id number]

## 2016-12-06 NOTE — Anesthesia Post-Procedure Evaluation (Signed)
Post-Anesthesia Evaluation and Assessment    Patient: Hannah [name] MRN: [id number]  SSN: [id number]    Date of Birth: Jan 22, 1956  Age: 61 y.o.  Sex: female       Cardiovascular Function/Vital Signs  Visit Vitals   ??? BP 116/72   ??? Pulse 79   ??? Temp 36.5 ??C (97.7 ??F)   ??? Resp 16   ??? Ht 5' 4"  (1.626 m)   ??? Wt 96.6 kg (213 lb)   ??? SpO2 95%   ??? BMI 36.56 kg/m2       Patient is status post MAC anesthesia for Procedure(s):  EXCISION OF RIGHT  BREAST MASS X2.    Nausea/Vomiting: None    Postoperative hydration reviewed and adequate.    Pain:  Pain Scale 1: Numeric (0 - 10) (12/06/16 1117)  Pain Intensity 1: 0 (12/06/16 1117)   Managed    Neurological Status:       At baseline    Mental Status and Level of Consciousness: Arousable    Pulmonary Status:   O2 Device: Room air (12/06/16 1116)   Adequate oxygenation and airway patent    Complications related to anesthesia: None    Post-anesthesia assessment completed. No concerns    Signed By: Charline Bills, MD     December 06, 2016

## 2016-12-06 NOTE — H&P (Signed)
History and Physical    Patient: Hannah [name] MRN: [id number]  CSN: [id number]    Date of Birth: [birth year]  Age: 61 y.o.  Sex: female      DOA: 12/06/2016       HPI:     Hannah [name] is a 61 y.o. female with PMH as documented, now with complaint of 2 right breast masses..    Past Medical History:   Diagnosis Date   ??? Anginal pain ([place])    ??? Anxiety and depression    ??? CHF (congestive heart failure) ([place])    ??? Emphysema/COPD ([place])    ??? Fibromyalgia    ??? GERD (gastroesophageal reflux disease)    ??? Hiatal hernia    ??? Hypercoagulable state ([facility])    ??? Hyperlipidemia    ??? Hypertension    ??? Nicotine vapor product user    ??? Seizure ([place])     last seizure 12/2013-grand mal   ??? Tachycardia        Past Surgical History:   Procedure Laterality Date   ??? HX CARPAL TUNNEL RELEASE Right    ??? HX CHOLECYSTECTOMY     ??? HX TUBAL LIGATION         History reviewed. No pertinent family history.    Social History     Social History   ??? Marital status: WIDOWED     Spouse name: N/A   ??? Number of children: N/A   ??? Years of education: N/A     Social History Main Topics   ??? Smoking status: Former Smoker     Quit date: 12/05/2013   ??? Smokeless tobacco: Current User   ??? Alcohol use Yes      Comment: monthly   ??? Drug use: No   ??? Sexual activity: Not Asked     Other Topics Concern   ??? None     Social History Narrative       Prior to Admission medications    Medication Sig Start Date End Date Taking? Authorizing Provider   levETIRAcetam (KEPPRA) 500 mg tablet Take 500 mg by mouth two (2) times a day. Indications: takes 750mg  at night   Yes Historical Provider   DULoxetine (CYMBALTA) 60 mg capsule Take 60 mg by mouth daily.   Yes Historical Provider   furosemide (LASIX) 20 mg tablet Take 20 mg by mouth daily.   Yes Historical Provider   potassium 99 mg tablet Take 99 mg by mouth daily.   Yes Historical Provider   pantoprazole (PROTONIX) 40 mg tablet Take 40 mg by mouth daily.   Yes Historical Provider    carvedilol (COREG) 12.5 mg tablet Take 12.5 mg by mouth two (2) times daily (with meals).   Yes Historical Provider   amoxicillin (AMOXIL) 875 mg tablet Take 875 mg by mouth two (2) times a day.   Yes Historical Provider   aspirin delayed-release 81 mg tablet Take  by mouth daily.   Yes Historical Provider   losartan (COZAAR) 100 mg tablet Take 100 mg by mouth nightly.   Yes Historical Provider   tiZANidine (ZANAFLEX) 2 mg capsule Take 2 mg by mouth nightly as needed.   Yes Historical Provider   rosuvastatin (CRESTOR) 40 mg tablet Take 40 mg by mouth nightly.   Yes Historical Provider   estrogen, conjugated,-medroxyPROGESTERone (PREMPRO) 0.3-1.5 mg tab Take 1 Tab by mouth daily.   Yes Historical Provider   albuterol (PROAIR HFA) 90 mcg/actuation inhaler Take  by inhalation.  Yes Historical Provider   budesonide-formoterol (SYMBICORT) 160-4.5 mcg/actuation HFAA Take 2 Puffs by inhalation two (2) times a day.   Yes Historical Provider   diclofenac (VOLTAREN) 1 % gel Apply  to affected area four (4) times daily.   Yes Historical Provider   lidocaine (LIDODERM) 5 % by TransDERmal route every twenty-four (24) hours. Apply patch to the affected area for 12 hours a day and remove for 12 hours a day.   Yes Historical Provider   diazePAM (VALIUM) 5 mg tablet Take 5 mg by mouth every eight (8) hours as needed for Anxiety.    Historical Provider       Allergies   Allergen Reactions   ??? Isosorbide Mononitrate Palpitations       Review of Systems  As noted above with no exceptions.    Physical Exam:      Visit Vitals   ??? BP 115/68   ??? Pulse 66   ??? Temp 97.7 ??F (36.5 ??C)   ??? Resp 16   ??? Ht 5\' 4"  (1.626 m)   ??? Wt 96.6 kg (213 lb)   ??? SpO2 94%   ??? BMI 36.56 kg/m2       Physical Exam:  General: WDWN in NAD  HEENT: AT/NC, PEERLA, EOMI, TMs clear with good landmarks bilat. Nose: no discharge. Oropharynx: clear  Neck: supple, no adenopathy or thyromegaly  CV: RRR with no M,G,S, no carotid bruits  Lungs: Clear to A &P   Abd: + BS, soft, non-tender, no masses, no rebound or guarding. Rectal: per intern/resident  Ext: No C,C,E, +/+ dorsalis pedis pulse bilat  Skin: no rash  Neuro: A & O x 3, CN 2-12 intact, DTRs + 3 and symmetric, no gross sensory deficits  Motor: 5/5 strength upper and lower ext bilat.      Labs Reviewed:  No results found for this or any previous visit (from the past 24 hour(s)).    Assessment/Plan     Active Problems:    * No active hospital problems. *  2 right breast masses. Excision as planned and previously discussed.      Joseph Pierini, MD  December 06, 2016  9:51 AM

## 2016-12-06 NOTE — Anesthesia Pre-Procedure Evaluation (Addendum)
Anesthetic History   No history of anesthetic complications            Review of Systems / Medical History  Patient summary reviewed, nursing notes reviewed and pertinent labs reviewed    Pulmonary    COPD      Smoker         Neuro/Psych         Psychiatric history     Cardiovascular    Hypertension      CHF    CAD    Exercise tolerance: <4 METS  Comments: Stress test last year nl per pt  Cath 2 vessel  Disease 50% no intervention     GI/Hepatic/Renal     GERD           Endo/Other        Obesity     Other Findings            Physical Exam    Airway  Mallampati: III  TM Distance: 4 - 6 cm  Neck ROM: normal range of motion   Mouth opening: Normal     Cardiovascular  Regular rate and rhythm,  S1 and S2 normal,  no murmur, click, rub, or gallop  Rhythm: regular           Dental  No notable dental hx       Pulmonary  Breath sounds clear to auscultation               Abdominal  GI exam deferred       Other Findings            Anesthetic Plan    ASA: 3  Anesthesia type: MAC            Anesthetic plan and risks discussed with: Patient

## 2016-12-06 NOTE — Brief Op Note (Signed)
BRIEF OPERATIVE NOTE    Date of Procedure: 12/06/2016   Preoperative Diagnosis: RIGHT BREAST MASS X2  Postoperative Diagnosis: RIGHT BREAST MASS X2    Procedure(s):  EXCISION OF RIGHT  BREAST MASS X2  Surgeon(s) and Role:     * Joseph Pierini, MD - Primary         Surgical Assistant: 0    Surgical Staff:  Circ-1: Heywood Iles  Circ-2: Delma Officer, RN  Scrub Tech-1: Tildon Husky  Event Time In   Incision Start 1027   Incision Close 1056     Anesthesia: MAC   Estimated Blood Loss: 0  Specimens:   ID Type Source Tests Collected by Time Destination   1 : Right Breast Mass Lateral Preservative  AP HISTOLOGY Joseph Pierini, MD 12/06/2016 1037 Pathology   2 : right breast mass medial Preservative   Joseph Pierini, MD 12/06/2016 1050 Pathology      Findings: masses   Complications: 0  Implants: * No implants in log *

## 2016-12-31 LAB — PULMONARY FUNCTION TEST

## 2017-01-14 NOTE — Op Note (Signed)
[facility]  NAME[place]  [place]  SEX:   F  SURGERY DATE: 12/06/2016  DOB: Oct 15, 1955  MR#    [id number]  LOCATION: SURG  ACCT#  [id number]        SURGEON:  Marni Griffon, M.D.    PROCEDURE:  Excision of 2 breast masses on the right.    PREOPERATIVE DIAGNOSIS:  Two breast masses on the right.    POSTOPERATIVE DIAGNOSIS:  Two breast masses on the right.    ANESTHESIA:  General supplemented with 0.5% Marcaine with epinephrine.    ESTIMATED BLOOD LOSS:  Minimal.    COMPLICATIONS:  None.    INDICATIONS:  The patient is a 61 year old female with 2 suspicious masses in the right breast.    FINDINGS AT OPERATION:  Two masses.    PROCEDURE IN DETAIL:  The patient was taken to the operating room, placed in supine position.  After adequate anesthesia was achieved, the skin of the right breast is prepped and draped in the usual sterile fashion.  Local anesthetic is injected into the area that was marked in the preoperative holding area with the patient's assistance indicating the masses.  Once the area was anesthetized, an incision is made, dissection carried through subcutaneous tissue with the cautery.  The mass is identified, grasped and using the cautery, the mass is completely excised, handed off the field.  In a similar fashion, the other mass is anesthetized, incised and completely excised.  The incisions are then closed using interrupted 3-0 Vicryl in the deep tissues and running 4-0 Monocryl in the skin.  Dermabond is placed on the wound.  The patient tolerated the procedure well and was brought to the recovery area in stable condition.      ___________________  Marni Griffon M.D.  Dictated By:.   MLT  D:01/14/2017 14:27:15  T: 01/14/2017 15:55:27  [id number]

## 2017-03-18 HISTORY — PX: BREAST SURGERY: SHX581

## 2017-03-18 HISTORY — PX: BREAST EXCISIONAL BIOPSY: SUR124

## 2017-05-13 ENCOUNTER — Encounter

## 2017-05-15 ENCOUNTER — Encounter

## 2017-05-16 ENCOUNTER — Encounter

## 2017-05-16 ENCOUNTER — Inpatient Hospital Stay: Admit: 2017-05-16 | Payer: MEDICARE | Attending: Family Medicine | Primary: Family Medicine

## 2017-05-16 DIAGNOSIS — Z1239 Encounter for other screening for malignant neoplasm of breast: Secondary | ICD-10-CM

## 2017-05-16 DIAGNOSIS — N63 Unspecified lump in unspecified breast: Secondary | ICD-10-CM

## 2017-05-23 ENCOUNTER — Encounter

## 2017-05-23 ENCOUNTER — Inpatient Hospital Stay: Admit: 2017-05-23 | Payer: MEDICARE | Primary: Family Medicine

## 2017-05-23 ENCOUNTER — Inpatient Hospital Stay: Admit: 2017-05-23 | Payer: MEDICARE | Attending: Family Medicine | Primary: Family Medicine

## 2017-05-23 DIAGNOSIS — N6310 Unspecified lump in the right breast, unspecified quadrant: Secondary | ICD-10-CM

## 2017-05-23 DIAGNOSIS — R928 Other abnormal and inconclusive findings on diagnostic imaging of breast: Secondary | ICD-10-CM

## 2017-05-23 MED ORDER — LIDOCAINE-EPINEPHRINE 1 %-1:100,000 IJ SOLN
1 %-:00,000 | Freq: Once | INTRAMUSCULAR | Status: AC
Start: 2017-05-23 — End: 2017-05-23
  Administered 2017-05-23: 19:00:00 via INTRADERMAL

## 2017-05-23 MED ORDER — LIDOCAINE HCL 1 % (10 MG/ML) IJ SOLN
10 mg/mL (1 %) | Freq: Once | INTRAMUSCULAR | Status: AC
Start: 2017-05-23 — End: 2017-05-23
  Administered 2017-05-23: 19:00:00 via INTRADERMAL

## 2017-10-24 ENCOUNTER — Encounter

## 2017-10-28 ENCOUNTER — Inpatient Hospital Stay: Admit: 2017-10-28 | Payer: MEDICARE | Primary: Family Medicine

## 2017-10-28 DIAGNOSIS — Z87891 Personal history of nicotine dependence: Secondary | ICD-10-CM

## 2017-10-28 NOTE — Progress Notes (Signed)
LUNG CANCER SCREENING:  This Letter was mailed to the Patient and faxed to the ordering physician.    Hannah [name]  [address]  [place] [address]       Dear [name],  RE: Your low-dose CT lung screening done on: 10/28/2017  Interpreted by: Dr. Haydee Salter  Report sent to: Dr. Jerilynn Mages. Posey Pronto  Thank you for choosing [facility] for your recent CT lung screening. As we discussed over the phone, we are sending this follow-up letter to confirm your testing results. Your CT lung screening showed no nodule or mass.  Your next lung screening exam is scheduled for: August 2020. Because this notice is far in advance, we will send you a reminder when the time gets closer.  Here are some other important points:  ? Please remember that you have freedom of choice when choosing a health care facility.   ? Your full low-dose CT lung screening report, including any minor observations, has been sent to your health care provider. Your exam report and images will be kept on file at [facility] as part of your permanent record and are available for your continuing care.  ? Although low-dose CT lung screening is very effective at finding lung cancer early, it cannot find all lung cancers. If you develop any new symptoms such as shortness of breath or chest pain, or you begin coughing up blood, please contact your doctor immediately.  ? Please keep in mind that good health involves quitting smoking (for help, call 1-800-QUIT-NOW), an annual physical exam and a yearly low-dose CT lung screening.  As I stated during our last phone conversation, I am always here to answer any questions you may have. If you have further questions about your results, questions about this letter or difficulty contacting your health care provider, please don???t hesitate to call me at [phone].   Sincerely,       Schuyler Amor, R.N., B.S.N.  Thoracic and Lung Health Nurse Navigator  Phone: [phone]   Cc Dr. Jerilynn Mages. Patel  10/28/2017

## 2017-10-28 NOTE — Progress Notes (Signed)
 LUNG CANCER SCREENING:  This Letter was mailed to the Patient and faxed to the ordering physician.    Hannah [name]  [address]  [place]  [place] [address]       Dear Ms. Bun,  RE: Your low-dose CT lung screening done on: 10/28/2017  Interpreted by: Dr. Vana  Report sent to: Dr. CHRISTELLA. [name]  Thank you for choosing [facility] for your recent CT lung screening. As we discussed over the phone, we are sending this follow-up letter to confirm your testing results. Your CT lung screening showed no nodule or mass.  Your next lung screening exam is scheduled for: August 2020. Because this notice is far in advance, we will send you a reminder when the time gets closer.  Here are some other important points:  . Please remember that you have freedom of choice when choosing a health care facility.   . Your full low-dose CT lung screening report, including any minor observations, has been sent to your health care provider. Your exam report and images will be kept on file at [facility] as part of your permanent record and are available for your continuing care.  . Although low-dose CT lung screening is very effective at finding lung cancer early, it cannot find all lung cancers. If you develop any new symptoms such as shortness of breath or chest pain, or you begin coughing up blood, please contact your doctor immediately.  . Please keep in mind that good health involves quitting smoking (for help, call 1-800-QUIT-NOW), an annual physical exam and a yearly low-dose CT lung screening.  As I stated during our last phone conversation, I am always here to answer any questions you may have. If you have further questions about your results, questions about this letter or difficulty contacting your health care provider, please don't hesitate to call me at [phone].   Sincerely,       Andres Mose, R.N., B.S.N.  Thoracic and Lung Health Nurse Navigator  Phone: [phone]  Cc Dr. CHRISTELLA.  Patel  10/28/2017

## 2017-12-01 ENCOUNTER — Inpatient Hospital Stay: Admit: 2017-12-01 | Payer: MEDICARE | Attending: Family Medicine | Primary: Family Medicine

## 2017-12-01 DIAGNOSIS — N641 Fat necrosis of breast: Secondary | ICD-10-CM

## 2018-01-06 ENCOUNTER — Encounter

## 2018-01-08 ENCOUNTER — Inpatient Hospital Stay: Admit: 2018-01-08 | Payer: MEDICARE | Attending: Family Medicine | Primary: Family Medicine

## 2018-01-08 DIAGNOSIS — D72829 Elevated white blood cell count, unspecified: Secondary | ICD-10-CM

## 2018-01-08 LAB — AMB POC CREATININE: Creatinine: 0.8 mg/dl (ref 0.6–1.3)

## 2018-01-08 LAB — CREATININE, POC: Creatinine: 0.8 mg/dl (ref 0.6–1.3)

## 2018-01-08 MED ORDER — IOPAMIDOL 76 % IV SOLN
370 mg iodine /mL (76 %) | Freq: Once | INTRAVENOUS | Status: AC
Start: 2018-01-08 — End: 2018-01-08
  Administered 2018-01-08: 16:00:00 via INTRAVENOUS

## 2018-01-08 MED ORDER — BARIUM SULFATE 2 % ORAL SUSP
2.1 % (w/v), 2.0 % (w/w) | Freq: Once | ORAL | Status: AC
Start: 2018-01-08 — End: 2018-01-08
  Administered 2018-01-08: 16:00:00 via ORAL

## 2018-01-08 MED FILL — READI-CAT 2  2.1 % (W/V), 2.0 % (W/W) ORAL SUSPENSION: 2.1 % (w/v), 2.0 % (w/w) | ORAL | Qty: 900

## 2018-01-08 MED FILL — ISOVUE-370  76 % INTRAVENOUS SOLUTION: 370 mg iodine /mL (76 %) | INTRAVENOUS | Qty: 80

## 2018-01-13 ENCOUNTER — Encounter

## 2018-01-15 ENCOUNTER — Encounter

## 2018-01-23 ENCOUNTER — Encounter

## 2018-02-09 ENCOUNTER — Encounter

## 2018-02-13 ENCOUNTER — Encounter

## 2018-02-13 ENCOUNTER — Inpatient Hospital Stay: Admit: 2018-02-13 | Payer: MEDICARE | Attending: Family Medicine | Primary: Family Medicine

## 2018-02-13 DIAGNOSIS — E279 Disorder of adrenal gland, unspecified: Secondary | ICD-10-CM

## 2018-02-18 ENCOUNTER — Inpatient Hospital Stay: Admit: 2018-02-18 | Discharge: 2018-02-18 | Payer: MEDICARE | Primary: Family Medicine

## 2018-02-18 DIAGNOSIS — R197 Diarrhea, unspecified: Secondary | ICD-10-CM

## 2018-02-26 LAB — MISC. LAB TEST

## 2018-03-06 ENCOUNTER — Ambulatory Visit: Payer: MEDICARE | Primary: Family Medicine

## 2018-03-18 HISTORY — PX: BREAST BIOPSY: SHX20

## 2018-03-20 ENCOUNTER — Inpatient Hospital Stay
Admit: 2018-03-20 | Discharge: 2018-03-23 | Disposition: A | Discharge status: Home / Self Care | Payer: MEDICARE | Attending: Internal Medicine | Admitting: Internal Medicine

## 2018-03-20 ENCOUNTER — Emergency Department: Admit: 2018-03-20 | Payer: MEDICARE | Primary: Family Medicine

## 2018-03-20 DIAGNOSIS — J441 Chronic obstructive pulmonary disease with (acute) exacerbation: Secondary | ICD-10-CM

## 2018-03-20 LAB — TROPONIN
Troponin I: <0.015 ng/ml (ref 0.000–0.045)
Troponin I: <0.015 ng/ml (ref 0.000–0.045)

## 2018-03-20 LAB — CBC WITH AUTO DIFFERENTIAL
Basophils %: 0.4 % (ref 0–3)
Eosinophils %: 1.6 % (ref 0–5)
Hematocrit: 42.1 % (ref 37.0–50.0)
Hemoglobin: 13.7 gm/dl (ref 13.0–17.2)
Immature Granulocytes: 0.5 % (ref 0.0–3.0)
Lymphocytes %: 30.8 % (ref 28–48)
MCH: 31.6 pg (ref 25.4–34.6)
MCHC: 32.5 gm/dl (ref 30.0–36.0)
MCV: 97.2 fL (ref 80.0–98.0)
MPV: 10.1 fL — ABNORMAL HIGH (ref 6.0–10.0)
Monocytes %: 4.9 % (ref 1–13)
Neutrophils %: 61.8 % (ref 34–64)
Nucleated RBCs: 0 (ref 0–0)
Platelets: 207 10*3/uL (ref 140–450)
RBC: 4.33 M/uL (ref 3.60–5.20)
RDW-SD: 43.7 (ref 36.4–46.3)
WBC: 7.4 10*3/uL (ref 4.0–11.0)

## 2018-03-20 LAB — BASIC METABOLIC PANEL
Anion Gap: 7 mmol/L (ref 5–15)
BUN: 19 mg/dl (ref 7–25)
CO2: 27 mEq/L (ref 21–32)
Calcium: 8.8 mg/dl (ref 8.5–10.1)
Chloride: 104 mEq/L (ref 98–107)
Creatinine: 1 mg/dl (ref 0.6–1.3)
EGFR IF NonAfrican American: 60
GFR African American: >60
Glucose: 182 mg/dl — ABNORMAL HIGH (ref 74–106)
Potassium: 4.2 mEq/L (ref 3.5–5.1)
Sodium: 138 mEq/L (ref 136–145)

## 2018-03-20 LAB — EKG 12-LEAD
Atrial Rate: 83 {beats}/min
Diagnosis: NORMAL
P Axis: 62 degrees
P-R Interval: 130 ms
Q-T Interval: 386 ms
QRS Duration: 78 ms
QTc Calculation (Bazett): 453 ms
R Axis: -14 degrees
T Axis: 29 degrees
Ventricular Rate: 83 {beats}/min

## 2018-03-20 LAB — POC BLOOD GAS + LACTIC ACID
BASE EXCESS: -1 mmol/L (ref <=3)
BICARBONATE: 23.6 mmol/L (ref 18.0–26.0)
Base Excess: -1 mmol/L (ref <=3)
CO2 Total: 25 mmol/L (ref 24–29)
CO2, TOTAL: 25 mmol/L (ref 24–29)
HCO3: 23.6 mmol/L (ref 18.0–26.0)
LACTIC ACID: 2.62 mmol/L — ABNORMAL HIGH (ref 0.40–2.00)
LITERS PER MINUTE, LPM: 2
Lactic Acid: 2.62 mmol/L — ABNORMAL HIGH (ref 0.40–2.00)
Liters/min.: 2
O2 SAT: 95 % (ref 90–100)
O2 Sat: 95 % (ref 90–100)
PCO2: 39.6 mm Hg (ref 35.0–45.0)
PCO2: 39.6 mm Hg (ref 35.0–45.0)
PO2: 75 mm Hg (ref 75–100)
PO2: 75 mm Hg (ref 75–100)
Patient temp.: 98.7
Patient temperature: 98.7
pH: 7.384 (ref 7.350–7.450)
pH: 7.384 (ref 7.350–7.450)

## 2018-03-20 LAB — POCT GLUCOSE: POC Glucose: 145 mg/dL — ABNORMAL HIGH (ref 65–105)

## 2018-03-20 LAB — INFLUENZA A/B, BY PCR
Influenza A PCR: NEGATIVE
Influenza A PCR: NEGATIVE
Influenza B PCR: NEGATIVE
Influenza B PCR: NEGATIVE

## 2018-03-20 LAB — PROBNP, N-TERMINAL: BNP: 38 pg/ml (ref 0.0–125.0)

## 2018-03-20 LAB — TROPONIN I
Troponin-I: <0.015 ng/ml (ref 0.000–0.045)
Troponin-I: <0.015 ng/ml (ref 0.000–0.045)

## 2018-03-20 LAB — EKG, 12 LEAD, INITIAL
Atrial Rate: 83 {beats}/min
Calculated P Axis: 62 degrees
Calculated R Axis: -14 degrees
Calculated T Axis: 29 degrees
Diagnosis: NORMAL
P-R Interval: 130 ms
Q-T Interval: 386 ms
QRS Duration: 78 ms
QTC Calculation (Bezet): 453 ms
Ventricular Rate: 83 {beats}/min

## 2018-03-20 LAB — METABOLIC PANEL, BASIC
Anion gap: 7 mmol/L (ref 5–15)
BUN: 19 mg/dl (ref 7–25)
CO2: 27 mEq/L (ref 21–32)
Calcium: 8.8 mg/dl (ref 8.5–10.1)
Chloride: 104 mEq/L (ref 98–107)
Creatinine: 1 mg/dl (ref 0.6–1.3)
GFR est AA: >60
GFR est non-AA: 60
Glucose: 182 mg/dl — ABNORMAL HIGH (ref 74–106)
Potassium: 4.2 mEq/L (ref 3.5–5.1)
Sodium: 138 mEq/L (ref 136–145)

## 2018-03-20 LAB — CBC WITH AUTOMATED DIFF
BASOPHILS: 0.4 % (ref 0–3)
EOSINOPHILS: 1.6 % (ref 0–5)
HCT: 42.1 % (ref 37.0–50.0)
HGB: 13.7 gm/dl (ref 13.0–17.2)
IMMATURE GRANULOCYTES: 0.5 % (ref 0.0–3.0)
LYMPHOCYTES: 30.8 % (ref 28–48)
MCH: 31.6 pg (ref 25.4–34.6)
MCHC: 32.5 gm/dl (ref 30.0–36.0)
MCV: 97.2 fL (ref 80.0–98.0)
MONOCYTES: 4.9 % (ref 1–13)
MPV: 10.1 fL — ABNORMAL HIGH (ref 6.0–10.0)
NEUTROPHILS: 61.8 % (ref 34–64)
NRBC: 0 (ref 0–0)
PLATELET: 207 10*3/uL (ref 140–450)
RBC: 4.33 M/uL (ref 3.60–5.20)
RDW-SD: 43.7 (ref 36.4–46.3)
WBC: 7.4 10*3/uL (ref 4.0–11.0)

## 2018-03-20 LAB — GLUCOSE, POC: Glucose (POC): 145 mg/dL — ABNORMAL HIGH (ref 65–105)

## 2018-03-20 LAB — NT-PRO BNP: NT pro-BNP: 38 pg/ml (ref 0.0–125.0)

## 2018-03-20 MED ORDER — NICOTINE 14 MG/24 HR DAILY PATCH
14 mg/24 hr | Freq: Every day | TRANSDERMAL | Status: DC
Start: 2018-03-20 — End: 2018-03-23

## 2018-03-20 MED ORDER — SODIUM CHLORIDE 0.9 % IV
500 mg | INTRAVENOUS | Status: DC
Start: 2018-03-20 — End: 2018-03-23
  Administered 2018-03-20 – 2018-03-22 (×3): via INTRAVENOUS

## 2018-03-20 MED ORDER — SODIUM CHLORIDE 0.9 % IJ SYRG
Freq: Three times a day (TID) | INTRAMUSCULAR | Status: DC
Start: 2018-03-20 — End: 2018-03-23
  Administered 2018-03-20 – 2018-03-23 (×9): via INTRAVENOUS

## 2018-03-20 MED ORDER — FLUTICASONE 200 MCG-VILANTEROL 25 MCG/DOSE BREATH ACTIVATED INHALER
200-25 mcg/dose | Freq: Every day | RESPIRATORY_TRACT | Status: DC
Start: 2018-03-20 — End: 2018-03-23
  Administered 2018-03-21 – 2018-03-23 (×3): via RESPIRATORY_TRACT

## 2018-03-20 MED ORDER — IPRATROPIUM-ALBUTEROL 2.5 MG-0.5 MG/3 ML NEB SOLUTION
2.5 mg-0.5 mg/3 ml | RESPIRATORY_TRACT | Status: AC
Start: 2018-03-20 — End: 2018-03-20
  Administered 2018-03-20: 16:00:00 via RESPIRATORY_TRACT

## 2018-03-20 MED ORDER — IPRATROPIUM-ALBUTEROL 2.5 MG-0.5 MG/3 ML NEB SOLUTION
2.5 mg-0.5 mg/3 ml | Freq: Four times a day (QID) | RESPIRATORY_TRACT | Status: DC
Start: 2018-03-20 — End: 2018-03-23
  Administered 2018-03-20 – 2018-03-23 (×12): via RESPIRATORY_TRACT

## 2018-03-20 MED ORDER — METHYLPREDNISOLONE (PF) 40 MG/ML IJ SOLR
40 mg/mL | Freq: Three times a day (TID) | INTRAMUSCULAR | Status: DC
Start: 2018-03-20 — End: 2018-03-22
  Administered 2018-03-21 – 2018-03-22 (×5): via INTRAVENOUS

## 2018-03-20 MED ORDER — PANTOPRAZOLE 40 MG TAB, DELAYED RELEASE
40 mg | Freq: Every day | ORAL | Status: DC
Start: 2018-03-20 — End: 2018-03-23
  Administered 2018-03-21 – 2018-03-23 (×3): via ORAL

## 2018-03-20 MED ORDER — DIAZEPAM 5 MG TAB
5 mg | Freq: Three times a day (TID) | ORAL | Status: DC | PRN
Start: 2018-03-20 — End: 2018-03-23
  Administered 2018-03-21 – 2018-03-23 (×3): via ORAL

## 2018-03-20 MED ORDER — FUROSEMIDE 20 MG TAB
20 mg | Freq: Every day | ORAL | Status: DC
Start: 2018-03-20 — End: 2018-03-23
  Administered 2018-03-21 – 2018-03-23 (×3): via ORAL

## 2018-03-20 MED ORDER — METHYLPREDNISOLONE (PF) 125 MG/2 ML IJ SOLR
125 mg/2 mL | INTRAMUSCULAR | Status: AC
Start: 2018-03-20 — End: 2018-03-20
  Administered 2018-03-20: 17:00:00 via INTRAVENOUS

## 2018-03-20 MED ORDER — TIZANIDINE 2 MG TAB
2 mg | Freq: Every evening | ORAL | Status: DC | PRN
Start: 2018-03-20 — End: 2018-03-23
  Administered 2018-03-21 – 2018-03-23 (×3): via ORAL

## 2018-03-20 MED ORDER — CARVEDILOL 6.25 MG TAB
6.25 mg | Freq: Two times a day (BID) | ORAL | Status: DC
Start: 2018-03-20 — End: 2018-03-22
  Administered 2018-03-20 – 2018-03-22 (×4): via ORAL

## 2018-03-20 MED ORDER — NALOXONE 0.4 MG/ML INJECTION
0.4 mg/mL | INTRAMUSCULAR | Status: DC | PRN
Start: 2018-03-20 — End: 2018-03-23

## 2018-03-20 MED ORDER — METHYLPREDNISOLONE (PF) 125 MG/2 ML IJ SOLR
1252 mg/2 mL | Freq: Three times a day (TID) | INTRAMUSCULAR | Status: DC
Start: 2018-03-20 — End: 2018-03-20
  Administered 2018-03-20: 21:00:00 via INTRAVENOUS

## 2018-03-20 MED ORDER — LOSARTAN 50 MG TAB
50 mg | Freq: Every evening | ORAL | Status: DC
Start: 2018-03-20 — End: 2018-03-23
  Administered 2018-03-21 – 2018-03-23 (×3): via ORAL

## 2018-03-20 MED ORDER — LEVETIRACETAM 500 MG TAB
500 mg | Freq: Two times a day (BID) | ORAL | Status: DC
Start: 2018-03-20 — End: 2018-03-23
  Administered 2018-03-21 – 2018-03-23 (×6): via ORAL

## 2018-03-20 MED ORDER — ENOXAPARIN 40 MG/0.4 ML SUB-Q SYRINGE
40 mg/0.4 mL | SUBCUTANEOUS | Status: DC
Start: 2018-03-20 — End: 2018-03-23
  Administered 2018-03-20 – 2018-03-22 (×3): via SUBCUTANEOUS

## 2018-03-20 MED ORDER — FISH OIL-OMEGA-3 FATTY ACIDS 1,000 MG-340 MG CAP
340-1000 mg | Freq: Every day | ORAL | Status: DC
Start: 2018-03-20 — End: 2018-03-23
  Administered 2018-03-21 – 2018-03-23 (×3): via ORAL

## 2018-03-20 MED ORDER — ACETAMINOPHEN 325 MG TABLET
325 mg | Freq: Four times a day (QID) | ORAL | Status: DC | PRN
Start: 2018-03-20 — End: 2018-03-23
  Administered 2018-03-21 (×2): via ORAL

## 2018-03-20 MED ORDER — SODIUM CHLORIDE 0.9 % IJ SYRG
INTRAMUSCULAR | Status: DC | PRN
Start: 2018-03-20 — End: 2018-03-23

## 2018-03-20 MED ORDER — ALBUTEROL SULFATE 0.083 % (0.83 MG/ML) SOLN FOR INHALATION
2.5 mg /3 mL (0.083 %) | RESPIRATORY_TRACT | Status: AC
Start: 2018-03-20 — End: 2018-03-20
  Administered 2018-03-20: 19:00:00 via RESPIRATORY_TRACT

## 2018-03-20 MED ORDER — ASPIRIN 81 MG TAB, DELAYED RELEASE
81 mg | Freq: Every day | ORAL | Status: DC
Start: 2018-03-20 — End: 2018-03-23
  Administered 2018-03-21 – 2018-03-23 (×3): via ORAL

## 2018-03-20 MED ORDER — DULOXETINE 30 MG CAP, DELAYED RELEASE
30 mg | Freq: Every day | ORAL | Status: DC
Start: 2018-03-20 — End: 2018-03-23
  Administered 2018-03-21 – 2018-03-23 (×3): via ORAL

## 2018-03-20 MED ORDER — IOPAMIDOL 76 % IV SOLN
370 mg iodine /mL (76 %) | Freq: Once | INTRAVENOUS | Status: AC
Start: 2018-03-20 — End: 2018-03-20
  Administered 2018-03-20: 18:00:00 via INTRAVENOUS

## 2018-03-20 MED ORDER — SODIUM CHLORIDE 0.9 % IJ SYRG
Freq: Once | INTRAMUSCULAR | Status: AC
Start: 2018-03-20 — End: 2018-03-20
  Administered 2018-03-20: 16:00:00 via INTRAVENOUS

## 2018-03-20 MED FILL — NICOTINE 14 MG/24 HR DAILY PATCH: 14 mg/24 hr | TRANSDERMAL | Qty: 1

## 2018-03-20 MED FILL — IPRATROPIUM-ALBUTEROL 2.5 MG-0.5 MG/3 ML NEB SOLUTION: 2.5 mg-0.5 mg/3 ml | RESPIRATORY_TRACT | Qty: 3

## 2018-03-20 MED FILL — AZITHROMYCIN 500 MG IV SOLUTION: 500 mg | INTRAVENOUS | Qty: 5

## 2018-03-20 MED FILL — SOLU-MEDROL (PF) 125 MG/2 ML SOLUTION FOR INJECTION: 125 mg/2 mL | INTRAMUSCULAR | Qty: 2

## 2018-03-20 MED FILL — BREO ELLIPTA 200 MCG-25 MCG/DOSE POWDER FOR INHALATION: 200-25 mcg/dose | RESPIRATORY_TRACT | Qty: 28

## 2018-03-20 MED FILL — CARVEDILOL 6.25 MG TAB: 6.25 mg | ORAL | Qty: 2

## 2018-03-20 MED FILL — ALBUTEROL SULFATE 0.083 % (0.83 MG/ML) SOLN FOR INHALATION: 2.5 mg /3 mL (0.083 %) | RESPIRATORY_TRACT | Qty: 1

## 2018-03-20 MED FILL — ENOXAPARIN 40 MG/0.4 ML SUB-Q SYRINGE: 40 mg/0.4 mL | SUBCUTANEOUS | Qty: 0.4

## 2018-03-20 MED FILL — ISOVUE-370  76 % INTRAVENOUS SOLUTION: 370 mg iodine /mL (76 %) | INTRAVENOUS | Qty: 80

## 2018-03-20 NOTE — Other (Signed)
Bedside and Verbal shift change report given to Shamir R Isidro, RN   (oncoming nurse) by Alicia,RN (offgoing nurse). Report included the following information SBAR, Kardex, MAR, Recent Results and Cardiac Rhythm SR.

## 2018-03-20 NOTE — Other (Signed)
TRANSFER - OUT REPORT:    Verbal report given to Elmo Putt, RN (name) on Hannah [name]  being transferred to [address] (unit) for routine progression of care       Report consisted of patient???s Situation, Background, Assessment and   Recommendations(SBAR).     Information from the following report(s) MAR, Recent results, Cardiac rhythm- NSR, Allergies, PMH was reviewed with the receiving nurse.    Lines:   Peripheral IV 03/20/18 Left Antecubital (Active)   Site Assessment Clean, dry, & intact 03/20/2018 11:04 AM   Phlebitis Assessment 0 03/20/2018 11:04 AM   Infiltration Assessment 4 03/20/2018 11:04 AM        Opportunity for questions and clarification was provided.      Patient transported with:   [employer]

## 2018-03-20 NOTE — ED Notes (Signed)
RT aware of breathing tx due. Will administer

## 2018-03-20 NOTE — H&P (Signed)
Admission History and Physical  Hannah [name] D.O.     Patient: Hannah [name] Age: 63 y.o. Sex: female    Date of Birth: [birth year] Admit Date: 03/20/2018 Admit Doctor: No admitting provider for patient encounter.   MRN: [id number]  CSN: [id number]  PCP: Hannah See, MD       Assessment / Plan   Acute hypoxic respiratory failure second to COPD exacerbation  Acute bronchitis  COPD exacerbation secondary to above  Tobacco abuse  Chronic diastolic congestive heart failure without evidence of acute exacerbation  GERD  Hypertension  Anxiety  Seizure disorder  Chronic pain    Plan:  -Admit to telemetry  -Check ABG  -Continue Solu-Medrol, DuoNeb scheduled, Symbicort, start azithromycin for bacterial bronchitis coverage  -Nicotine patch  -Continue cardiac regimen for CHF including Coreg, aspirin, Lasix, losartan  -Continue home Protonix for reflux  -Trend troponins given patient complaint of palpitations and chest pain, this is likely exacerbated due to her coughing and is more pleuritic in nature  -Continue home Keppra 1000 twice daily for seizure disorder  -Continue home anxiety and pain regimen including Valium as needed, Zanaflex nightly and Cymbalta    Diet: Cardiac  DVT prophylaxis: Heparin      DISPO  -Pt to be admitted  at this time for reasons addressed above, continued hospitalization for ongoing assessment and treatment indicated     Anticipated Date of Discharge: 03/22/2018  Anticipated Disposition (home, SNF) : Home    Code Status: Full code    MPOA:Son, [phone]      Smoking cessation discussed and encouraged. Risks of potentially life threatening consequences of continued smoking discussed. Patient is amenable to nicotine patch, but not yet willing to set a quit date. Total time counselling 6 min.        Chief Complaint:   Chief Complaint   Patient presents with   ??? Shortness of Breath   ??? Palpitations         HPI:   Hannah [name] is a 63 y.o. year old female medical history of chronic  diastolic congestive heart failure, COPD, tobacco abuse who continues to smoke, GERD, hypertension, anxiety and seizure disorder presents from home today accompanied by her sister with whom she lives in her son.  Patient complains of approximately 1 week of gradually worsening shortness of breath and chest tightness.  She states her symptoms began as nasal congestion for about 3 days a week ago associated with fever and chills at that time, she took over-the-counter Sudafed and it caused her to have significant palpitations.  She states since then her shortness of breath and palpitations have worsened, she has a cough with productive clear mucus, no recent fever or chills.  Denies any leg swelling or orthopnea symptoms.  When asked how many times she takes her albuterol she states "all the time ", estimates using her albuterol least 6-7 times today prior to arrival to the emergency room without relief of her chest tightness and wheezing.    ER course: Patient received duo nebs x2 Solu-Medrol 125 with subjective improvement in chest tightness.  Chest x-ray without infiltrate.  Patient reported having a "prothrombotic state "denies being told of any DVT or PE however, unclear etiology for this diagnosis therefore CTA was done which did not reveal aortic dissection or PE but was consistent with prominent right hilar lymphadenopathy as well as left hilar lymphadenopathy and groundglass appearance in the bilateral lungs.  Patient was hypoxic down to 87% room air, was  started on 3 L nasal cannula with improvement to 9394%.  No prior oxygen requirement.    Review of Systems -   Review of Systems   Constitutional: Positive for malaise/fatigue. Negative for chills and fever.   HENT: Negative for congestion, ear pain, sinus pain and sore throat.    Eyes: Negative for blurred vision and photophobia.   Respiratory: Positive for cough, shortness of breath and wheezing.     Cardiovascular: Negative for chest pain, palpitations and leg swelling.   Gastrointestinal: Negative for abdominal pain, constipation, diarrhea, heartburn, nausea and vomiting.   Genitourinary: Negative for dysuria and flank pain.   Musculoskeletal: Negative for myalgias.   Skin: Negative for itching and rash.   Neurological: Negative for dizziness, weakness and headaches.   Endo/Heme/Allergies: Negative for polydipsia.   Psychiatric/Behavioral: Negative for depression. The patient is not nervous/anxious.          Past Medical History:  Past Medical History:   Diagnosis Date   ??? Anginal pain ([place])    ??? Anxiety and depression    ??? CHF (congestive heart failure) ([facility])     muscle   ??? Emphysema/COPD ([place])    ??? Fibromyalgia    ??? GERD (gastroesophageal reflux disease)    ??? Hiatal hernia    ??? Hypercoagulable state ([place])    ??? Hyperlipidemia    ??? Hypertension    ??? Nicotine vapor product user    ??? 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 Tachycardia        Past Surgical History:  Past Surgical History:   Procedure Laterality Date   ??? HX BREAST BIOPSY Right 12/06/2016    EXCISION OF RIGHT  BREAST MASS X2 performed by Joseph Pierini, MD at [facility]   ??? HX CARPAL TUNNEL RELEASE Right    ??? HX CHOLECYSTECTOMY     ??? HX ORTHOPAEDIC  2016    plate and cadaver bone placed in neck   ??? HX TUBAL LIGATION         Family History:  History reviewed. No pertinent family history.    Social History:  Social History     Socioeconomic History   ??? Marital status: WIDOWED     Spouse name: Not on file   ??? Number of children: Not on file   ??? Years of education: Not on file   ??? Highest education level: Not on file   Tobacco Use   ??? Smoking status: Former Smoker     Packs/day: 0.50     Years: 45.00     Pack years: 22.50     Types: Cigarettes   ??? Smokeless tobacco: Current User   Substance and Sexual Activity   ??? Alcohol use: Yes     Comment: monthly   ??? Drug use: No   Other Topics Concern       Home Medications:   Prior to Admission medications    Medication Sig Start Date End Date Taking? Authorizing Provider   docosahexanoic acid/epa (FISH OIL PO) Take  by mouth. [place]   Yes Other, Phys, MD   levETIRAcetam (KEPPRA) 500 mg tablet Take 500 mg by mouth two (2) times a day. Indications: takes 750mg  at night   Yes Provider, Historical   DULoxetine (CYMBALTA) 60 mg capsule Take 60 mg by mouth daily.   Yes Provider, Historical   furosemide (LASIX) 20 mg tablet Take 20 mg by mouth daily.   Yes Provider, Historical  potassium 99 mg tablet Take 99 mg by mouth daily.   Yes Provider, Historical   pantoprazole (PROTONIX) 40 mg tablet Take 40 mg by mouth daily.   Yes Provider, Historical   carvedilol (COREG) 12.5 mg tablet Take 12.5 mg by mouth two (2) times daily (with meals).   Yes Provider, Historical   aspirin delayed-release 81 mg tablet Take  by mouth daily.   Yes Provider, Historical   diazePAM (VALIUM) 5 mg tablet Take 5 mg by mouth every eight (8) hours as needed for Anxiety.   Yes Provider, Historical   losartan (COZAAR) 100 mg tablet Take 100 mg by mouth nightly.   Yes Provider, Historical   tiZANidine (ZANAFLEX) 2 mg capsule Take 2 mg by mouth nightly as needed.   Yes Provider, Historical   estrogen, conjugated,-medroxyPROGESTERone (PREMPRO) 0.3-1.5 mg tab Take 1 Tab by mouth daily.   Yes Provider, Historical   albuterol (PROAIR HFA) 90 mcg/actuation inhaler Take  by inhalation.   Yes Provider, Historical   budesonide-formoterol (SYMBICORT) 160-4.5 mcg/actuation HFAA Take 2 Puffs by inhalation two (2) times a day.   Yes Provider, Historical   diclofenac (VOLTAREN) 1 % gel Apply  to affected area four (4) times daily.   Yes Provider, Historical   lidocaine (LIDODERM) 5 % by TransDERmal route every twenty-four (24) hours. Apply patch to the affected area for 12 hours a day and remove for 12 hours a day.   Yes Provider, Historical       Allergies:  Allergies   Allergen Reactions   ??? Regadenoson Other (comments)      Trouble breathing  Trouble breathing     ??? Adhesive Tape-Silicones Itching   ??? Isosorbide Mononitrate Palpitations   ??? Milnacipran Other (comments)     REACTION: tachycardia  REACTION: tachycardia           Physical Exam:     Visit Vitals  BP 105/55   Pulse 76   Temp 97.9 ??F (36.6 ??C)   Resp 24   Ht 5\' 4"  (1.626 m)   Wt 92.5 kg (204 lb)   SpO2 93%   BMI 35.02 kg/m??     Physical Exam  Constitutional:       General: She is not in acute distress.     Appearance: She is not diaphoretic.   HENT:      Head: Normocephalic and atraumatic.      Right Ear: External ear normal.      Left Ear: External ear normal.      Nose: Nose normal.      Mouth/Throat:      Dentition: Normal dentition.   Eyes:      General:         Right eye: No discharge.         Left eye: No discharge.      Conjunctiva/sclera: Conjunctivae normal.      Pupils: Pupils are equal, round, and reactive to light.   Neck:      Musculoskeletal: Neck supple.      Thyroid: No thyromegaly.      Vascular: No JVD.   Cardiovascular:      Rate and Rhythm: Normal rate and regular rhythm.      Heart sounds: Normal heart sounds. No murmur. No friction rub.   Pulmonary:      Effort: Tachypnea present.      Breath sounds: Examination of the right-upper field reveals wheezing. Examination of the left-upper field reveals wheezing. Wheezing present.  Abdominal:      General: Bowel sounds are normal. There is no distension.      Palpations: Abdomen is soft.      Tenderness: There is no tenderness. There is no guarding or rebound.   Musculoskeletal: Normal range of motion.   Skin:     General: Skin is warm and dry.   Neurological:      Mental Status: She is alert and oriented to person, place, and time.      Cranial Nerves: No cranial nerve deficit.      Coordination: Coordination normal.      Deep Tendon Reflexes: Reflexes are normal and symmetric.   Psychiatric:         Mood and Affect: Mood and affect normal.         Cognition and Memory: Memory normal.          Judgment: Judgment normal.         Intake and Output:  Current Shift:  No intake/output data recorded.  Last three shifts:  No intake/output data recorded.    Lab/Data Reviewed:  Recent Results (from the past 24 hour(s))   CBC WITH AUTOMATED DIFF    Collection Time: 03/20/18 11:05 AM   Result Value Ref Range    WBC 7.4 4.0 - 11.0 1000/mm3    RBC 4.33 3.60 - 5.20 M/uL    HGB 13.7 13.0 - 17.2 gm/dl    HCT 42.1 37.0 - 50.0 %    MCV 97.2 80.0 - 98.0 fL    MCH 31.6 25.4 - 34.6 pg    MCHC 32.5 30.0 - 36.0 gm/dl    PLATELET 207 140 - 450 1000/mm3    MPV 10.1 (H) 6.0 - 10.0 fL    RDW-SD 43.7 36.4 - 46.3      NRBC 0 0 - 0      IMMATURE GRANULOCYTES 0.5 0.0 - 3.0 %    NEUTROPHILS 61.8 34 - 64 %    LYMPHOCYTES 30.8 28 - 48 %    MONOCYTES 4.9 1 - 13 %    EOSINOPHILS 1.6 0 - 5 %    BASOPHILS 0.4 0 - 3 %   METABOLIC PANEL, BASIC    Collection Time: 03/20/18 11:05 AM   Result Value Ref Range    Sodium 138 136 - 145 mEq/L    Potassium 4.2 3.5 - 5.1 mEq/L    Chloride 104 98 - 107 mEq/L    CO2 27 21 - 32 mEq/L    Glucose 182 (H) 74 - 106 mg/dl    BUN 19 7 - 25 mg/dl    Creatinine 1.0 0.6 - 1.3 mg/dl    GFR est AA >60.0      GFR est non-AA 60      Calcium 8.8 8.5 - 10.1 mg/dl    Anion gap 7 5 - 15 mmol/L   TROPONIN I    Collection Time: 03/20/18 11:05 AM   Result Value Ref Range    Troponin-I <0.015 0.000 - 0.045 ng/ml   NT-PRO BNP    Collection Time: 03/20/18 11:05 AM   Result Value Ref Range    NT pro-BNP 38.0 0.0 - 125.0 pg/ml   INFLUENZA A/B, BY PCR    Collection Time: 03/20/18  1:30 PM   Result Value Ref Range    Influenza A PCR NEGATIVE NEGATIVE      Influenza B PCR NEGATIVE NEGATIVE       XR Results (most recent):  Results from  Hospital Encounter encounter on 03/20/18   XR CHEST SNGL V    Narrative Indication: sob.    .      Impression IMPRESSION: Portable AP upright view the chest exposed at 11:00 AM March 20, 2018 reveals the lungs are clear. The heart is of normal size. Lower anterior   cervical fusion hardware is in place..            CT Results (most recent):  Results from Hospital Encounter encounter on 03/20/18   CTA CHEST W OR W WO CONT    Narrative History: sob.  History of hypercoagulable state..          Impression Impression: No aortic dissection or pulmonary embolism.         Comment: 3D CTA images of the chest were performed with intravenous contrast.  Multiplanar PE protocol was utilized and MIPS projections were obtained.  This  was compared with low-dose CT of October 28, 2017 and October 29, 2016.    There is no evidence of pulmonary embolism or aortic dissection.  The aorta is  of normal caliber. The heart is of normal size.  No pleural or pericardial  effusion.    Mildly prominent right hilar and to lesser extent left hilar lymph nodes are  observed. No other adenopathy.    Groundglass granularity is present throughout both lungs. No pulmonary  consolidation or mass.    DICOM format imaged data is available to non-affiliated external healthcare  facilities or entities on a secure, media free, reciprocally searchable basis  with patient authorization  for 12 months following the date of the study.                  Hannah Kind, DO    March 20, 2018  2:35 PM    Dragon medical dictation software was used for portions of this report.  Unintended voice transcription errors may have occurred.

## 2018-03-20 NOTE — ED Notes (Signed)
Pt o2 sat at 88% on RA after breathing tx. Pt placed on 2 L nasal cannula. O2 95%

## 2018-03-20 NOTE — ED Notes (Signed)
RT at bedside giving breathing tx

## 2018-03-20 NOTE — Progress Notes (Signed)
Results for Hannah [name], Hannah [name] (MRN [id number]) as of 03/20/2018 15:26   Ref. Range 03/20/2018 15:18   pH Latest Ref Range: 7.350 - 7.450   7.384   PCO2 Latest Ref Range: 35.0 - 45.0 mm Hg 39.6   PO2 Latest Ref Range: 75 - 100 mm Hg 75.0   BICARBONATE Latest Ref Range: 18.0 - 26.0 mmol/L 23.6   O2 SAT Latest Ref Range: 90 - 100 % 95.0   BASE EXCESS Latest Ref Range: -2 - 3 mmol/L -1   SITE Latest Units:   R Radial   Sample type Latest Units:   Art   ALLENS TEST Latest Units:   Pass   DEVICE Latest Units:   Nasal Can   Patient temp. Latest Units:   98.7

## 2018-03-20 NOTE — Progress Notes (Signed)
[facility]: Medication History    Medication History Completed Prior to Order Reconciliation?  YES  If "no" and discrepancies were noted please contact attending physician or pharmacist to follow-up: N/A - Medication history was obtained prior to reconciliation.    Information obtained from (list all that apply, 2 sources preferred): Patient and Other: MEDICATION LIST   If a history was not reviewed directly with patient/caregiver please comment with the reason why: N/A     Antibiotic use in the last 90 days (3 months): NO      Missing Medication Identified  NO  Number of medications: -  Indicate action taken: N/A      Wrong Medication Identified NO   Number of medications: -  Indicate action taken: N/A      Wrong Dose/Interval/Route Identified NO              Number of medications: -   Indicate action taken: N/A        Is patient currently taking warfarin:  No        Medication Compliance Issues and/or Medication Concerns: N/A      Allergies: Regadenoson; Adhesive tape-silicones; Isosorbide mononitrate; and Milnacipran    Prior to Admission Medications:    Prior to Admission Medications   Prescriptions Last Dose Informant Patient Reported? Taking?   DULoxetine (CYMBALTA) 60 mg capsule   Yes Yes   Sig: Take 60 mg by mouth daily.   albuterol (PROAIR HFA) 90 mcg/actuation inhaler   Yes Yes   Sig: Take  by inhalation.   aspirin delayed-release 81 mg tablet   Yes Yes   Sig: Take  by mouth daily.   budesonide-formoterol (SYMBICORT) 160-4.5 mcg/actuation HFAA   Yes Yes   Sig: Take 2 Puffs by inhalation two (2) times a day.   carvedilol (COREG) 12.5 mg tablet   Yes Yes   Sig: Take 12.5 mg by mouth two (2) times daily (with meals).   diazePAM (VALIUM) 5 mg tablet   Yes Yes   Sig: Take 5 mg by mouth every eight (8) hours as needed for Anxiety.   diclofenac (VOLTAREN) 1 % gel   Yes Yes   Sig: Apply  to affected area four (4) times daily.   docosahexanoic acid/epa (FISH OIL PO)   Yes Yes    Sig: Take  by mouth. [place]   estrogen, conjugated,-medroxyPROGESTERone (PREMPRO) 0.3-1.5 mg tab   Yes Yes   Sig: Take 1 Tab by mouth daily.   furosemide (LASIX) 20 mg tablet   Yes Yes   Sig: Take 20 mg by mouth daily.   levETIRAcetam (KEPPRA) 500 mg tablet   Yes Yes   Sig: Take 1,000 mg by mouth two (2) times a day. Indications: takes 750mg  at night   lidocaine (LIDODERM) 5 %   Yes Yes   Sig: by TransDERmal route every twenty-four (24) hours. Apply patch to the affected area for 12 hours a day and remove for 12 hours a day.   losartan (COZAAR) 100 mg tablet   Yes Yes   Sig: Take 100 mg by mouth nightly.   pantoprazole (PROTONIX) 40 mg tablet   Yes Yes   Sig: Take 40 mg by mouth daily.   potassium 99 mg tablet   Yes Yes   Sig: Take 99 mg by mouth daily.   tiZANidine (ZANAFLEX) 2 mg capsule   Yes Yes   Sig: Take 2 mg by mouth nightly as needed.      Facility-Administered  Medications: None         Hannah [name]   Contact: [id number]

## 2018-03-20 NOTE — ED Triage Notes (Signed)
Pt reports that she has started feeling bad the day after new years after the grandkids left.  Pt took sudafed and thought it made her blood pressure go up so she quit taking .  Has not been sleeping well the last few nights, has been waking up short of breath and with heart palpiations.  Does have a history of blood clots.  Pt takes low dose aspirin every night.  Pt reports taking losartan and carvidolol which has helped her decrease her blood pressure and pulse since this morning. Pt pulse steady at 78-80 in  Triage, O2 at 94%, Does take inhalers but not helping.  Was on anitibiotics before christmas for bacterial infection in intestines, has follow up appot on the 13th regarding this issue.

## 2018-03-20 NOTE — ED Provider Notes (Signed)
HPI   63 year old female with history of CHF, COPD (smoker) not oxygen dependent, GERD, angina, hypertension, hyperlipidemia, hypercoagulable state who comes to the ED due to shortness of breath.  Patient states that she was doing okay until 1 week ago when she started with nasal congestion.  Patient took Sudafed for around 3 days.  She stopped because it was giving her palpitations.  Since then, she noticed progressive shortness of breath associated with occasional palpitations, cough with clear phlegm and one episode of sharp chest pain last night that resolved with nitroglycerin.  Patient denies fever, nausea or vomiting, diarrhea, abdominal pain, worsening of her chronic back pain, legs pain or swelling, or other complaints.  Past Medical History:   Diagnosis Date   ??? Anginal pain ([place])    ??? Anxiety and depression    ??? CHF (congestive heart failure) (HCC)     muscle   ??? Emphysema/COPD ([place])    ??? Fibromyalgia    ??? GERD (gastroesophageal reflux disease)    ??? Hiatal hernia    ??? Hypercoagulable state ([place])    ??? Hyperlipidemia    ??? Hypertension    ??? Nicotine vapor product user    ??? 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 Tachycardia        Past Surgical History:   Procedure Laterality Date   ??? HX BREAST BIOPSY Right 12/06/2016    EXCISION OF RIGHT  BREAST MASS X2 performed by Joseph Pierini, MD at [place]   ??? HX CARPAL TUNNEL RELEASE Right    ??? HX CHOLECYSTECTOMY     ??? HX ORTHOPAEDIC  2016    plate and cadaver bone placed in neck   ??? HX TUBAL LIGATION           History reviewed. No pertinent family history.    Social History     Socioeconomic History   ??? Marital status: WIDOWED     Spouse name: Not on file   ??? Number of children: Not on file   ??? Years of education: Not on file   ??? Highest education level: Not on file   Occupational History   ??? Not on file   Social Needs   ??? Financial resource strain: Not on file   ??? Food insecurity:     Worry: Not on file     Inability: Not on file    ??? Transportation needs:     Medical: Not on file     Non-medical: Not on file   Tobacco Use   ??? Smoking status: Former Smoker     Packs/day: 0.50     Years: 45.00     Pack years: 22.50     Types: Cigarettes   ??? Smokeless tobacco: Current User   Substance and Sexual Activity   ??? Alcohol use: Yes     Comment: monthly   ??? Drug use: No   ??? Sexual activity: Not on file   Lifestyle   ??? Physical activity:     Days per week: Not on file     Minutes per session: Not on file   ??? Stress: Not on file   Relationships   ??? Social connections:     Talks on phone: Not on file     Gets together: Not on file     Attends religious service: Not on file     Active member of club or organization: Not on file  Attends meetings of clubs or organizations: Not on file     Relationship status: Not on file   ??? Intimate partner violence:     Fear of current or ex partner: Not on file     Emotionally abused: Not on file     Physically abused: Not on file     Forced sexual activity: Not on file   Other Topics Concern   ??? Military Service Not Asked   ??? Blood Transfusions Not Asked   ??? Caffeine Concern Not Asked   ??? Occupational Exposure Not Asked   ??? Hobby Hazards Not Asked   ??? Sleep Concern Not Asked   ??? Stress Concern Not Asked   ??? Weight Concern Not Asked   ??? Special Diet Not Asked   ??? Back Care Not Asked   ??? Exercise Not Asked   ??? Bike Helmet Not Asked   ??? Seat Belt Not Asked   ??? Self-Exams Not Asked   Social History Narrative   ??? Not on file         ALLERGIES: Regadenoson; Adhesive tape-silicones; Isosorbide mononitrate; and Milnacipran    Review of Systems   Constitutional: Negative.  Negative for chills and fever.   HENT: Positive for congestion.    Eyes: Negative.    Respiratory: Positive for cough (Clear phlegm), chest tightness, shortness of breath and wheezing.    Cardiovascular: Positive for chest pain and palpitations. Negative for leg swelling.   Gastrointestinal: Negative.  Negative for abdominal pain, diarrhea, nausea  and vomiting.   Genitourinary: Negative.  Negative for dysuria and flank pain.   Musculoskeletal: Positive for back pain (Chronic).   Skin: Negative.    Neurological: Negative.    All other systems reviewed and are negative.      Vitals:    03/20/18 1117 03/20/18 1132 03/20/18 1202 03/20/18 1414   BP:  114/60 105/55    Pulse:  77 76    Resp:  28 24    Temp:       SpO2: 92% (!) 89% 91% 93%   Weight:       Height:                Physical Exam  Vitals signs and nursing note reviewed. Exam conducted with a chaperone present.   Constitutional:       Appearance: She is well-developed.   HENT:      Head: Normocephalic and atraumatic.      Nose: Nose normal.   Eyes:      Extraocular Movements: Extraocular movements intact.      Conjunctiva/sclera: Conjunctivae normal.      Pupils: Pupils are equal, round, and reactive to light.   Neck:      Musculoskeletal: Normal range of motion and neck supple.   Cardiovascular:      Rate and Rhythm: Normal rate and regular rhythm.      Pulses: Normal pulses.           Dorsalis pedis pulses are 2+ on the right side and 2+ on the left side.      Heart sounds: Normal heart sounds.   Pulmonary:      Effort: Prolonged expiration and respiratory distress (Mild) present. No tachypnea, bradypnea, accessory muscle usage or retractions.      Breath sounds: No stridor, decreased air movement or transmitted upper airway sounds. Wheezing (Diffuse bilateral expiratory wheezes) present. No decreased breath sounds, rhonchi or rales.   Abdominal:  General: Bowel sounds are normal. There is no distension.      Palpations: Abdomen is soft.      Tenderness: There is no tenderness. There is no guarding or rebound.   Musculoskeletal: Normal range of motion.         General: No swelling or tenderness.      Right lower leg: No edema.      Left lower leg: No edema.   Skin:     General: Skin is warm and dry.      Capillary Refill: Capillary refill takes less than 2 seconds.   Neurological:       General: No focal deficit present.      Mental Status: She is alert and oriented to person, place, and time.      Deep Tendon Reflexes: Reflexes are normal and symmetric.          MDM   EKG: NSR. HR:83. No STEMI.     Recent Results (from the past 12 hour(s))   CBC WITH AUTOMATED DIFF    Collection Time: 03/20/18 11:05 AM   Result Value Ref Range    WBC 7.4 4.0 - 11.0 1000/mm3    RBC 4.33 3.60 - 5.20 M/uL    HGB 13.7 13.0 - 17.2 gm/dl    HCT 42.1 37.0 - 50.0 %    MCV 97.2 80.0 - 98.0 fL    MCH 31.6 25.4 - 34.6 pg    MCHC 32.5 30.0 - 36.0 gm/dl    PLATELET 207 140 - 450 1000/mm3    MPV 10.1 (H) 6.0 - 10.0 fL    RDW-SD 43.7 36.4 - 46.3      NRBC 0 0 - 0      IMMATURE GRANULOCYTES 0.5 0.0 - 3.0 %    NEUTROPHILS 61.8 34 - 64 %    LYMPHOCYTES 30.8 28 - 48 %    MONOCYTES 4.9 1 - 13 %    EOSINOPHILS 1.6 0 - 5 %    BASOPHILS 0.4 0 - 3 %   METABOLIC PANEL, BASIC    Collection Time: 03/20/18 11:05 AM   Result Value Ref Range    Sodium 138 136 - 145 mEq/L    Potassium 4.2 3.5 - 5.1 mEq/L    Chloride 104 98 - 107 mEq/L    CO2 27 21 - 32 mEq/L    Glucose 182 (H) 74 - 106 mg/dl    BUN 19 7 - 25 mg/dl    Creatinine 1.0 0.6 - 1.3 mg/dl    GFR est AA >60.0      GFR est non-AA 60      Calcium 8.8 8.5 - 10.1 mg/dl    Anion gap 7 5 - 15 mmol/L   TROPONIN I    Collection Time: 03/20/18 11:05 AM   Result Value Ref Range    Troponin-I <0.015 0.000 - 0.045 ng/ml   NT-PRO BNP    Collection Time: 03/20/18 11:05 AM   Result Value Ref Range    NT pro-BNP 38.0 0.0 - 125.0 pg/ml   INFLUENZA A/B, BY PCR    Collection Time: 03/20/18  1:30 PM   Result Value Ref Range    Influenza A PCR NEGATIVE NEGATIVE      Influenza B PCR NEGATIVE NEGATIVE       Xr Chest Sngl V    Result Date: 03/20/2018  IMPRESSION: Portable AP upright view the chest exposed at 11:00 AM March 20, 2018 reveals the lungs are clear.  The heart is of normal size. Lower anterior cervical fusion hardware is in place.Maude Leriche Chest W Or W Wo Cont    Result Date: 03/20/2018   Impression: No aortic dissection or pulmonary embolism.     Comment: 3D CTA images of the chest were performed with intravenous contrast. Multiplanar PE protocol was utilized and MIPS projections were obtained.  This was compared with low-dose CT of October 28, 2017 and October 29, 2016. There is no evidence of pulmonary embolism or aortic dissection.  The aorta is of normal caliber. The heart is of normal size.  No pleural or pericardial effusion. Mildly prominent right hilar and to lesser extent left hilar lymph nodes are observed. No other adenopathy. Groundglass granularity is present throughout both lungs. No pulmonary consolidation or mass. DICOM format imaged data is available to non-affiliated external healthcare facilities or entities on a secure, media free, reciprocally searchable basis with patient authorization  for 12 months following the date of the study.      1:03 PM Work-up is unremarkable.  Upon reevaluation patient continues with wheezes and shortness of breath after treatment.  I will continue nebulizer treatment and reassess.    2:29 PM After another breathing treatment patient still wheezing and with pulse ox in the low 90s despite supplemental oxygen.  I will discuss admission with hospitalist for further treatment of COPD exacerbation.    2:40 PM D/w Dr. Tonye Royalty will admit to med telemetry.    ProceduresNA        ICD-10-CM ICD-9-CM   1. Chronic obstructive pulmonary disease with acute exacerbation (HCC) J44.1 491.21

## 2018-03-20 NOTE — ED Provider Notes (Signed)
HPI   63 year old female with history of CHF, COPD (smoker) not oxygen dependent, GERD, angina, hypertension, hyperlipidemia, hypercoagulable state who comes to the ED due to shortness of breath.  Patient states that she was doing okay until 1 week ago when she started with nasal congestion.  Patient took Sudafed for around 3 days.  She stopped because it was giving her palpitations.  Since then, she noticed progressive shortness of breath associated with occasional palpitations, cough with clear phlegm and one episode of sharp chest pain last night that resolved with nitroglycerin.  Patient denies fever, nausea or vomiting, diarrhea, abdominal pain, worsening of her chronic back pain, legs pain or swelling, or other complaints.  Past Medical History:   Diagnosis Date   ??? Anginal pain ([place])    ??? Anxiety and depression    ??? CHF (congestive heart failure) (HCC)     muscle   ??? Emphysema/COPD ([place])    ??? Fibromyalgia    ??? GERD (gastroesophageal reflux disease)    ??? Hiatal hernia    ??? Hypercoagulable state ([place])    ??? Hyperlipidemia    ??? Hypertension    ??? Nicotine vapor product user    ??? 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 Tachycardia        Past Surgical History:   Procedure Laterality Date   ??? HX BREAST BIOPSY Right 12/06/2016    EXCISION OF RIGHT  BREAST MASS X2 performed by Joseph Pierini, MD at [place]   ??? HX CARPAL TUNNEL RELEASE Right    ??? HX CHOLECYSTECTOMY     ??? HX ORTHOPAEDIC  2016    plate and cadaver bone placed in neck   ??? HX TUBAL LIGATION           History reviewed. No pertinent family history.    Social History     Socioeconomic History   ??? Marital status: WIDOWED     Spouse name: Not on file   ??? Number of children: Not on file   ??? Years of education: Not on file   ??? Highest education level: Not on file   Occupational History   ??? Not on file   Social Needs   ??? Financial resource strain: Not on file   ??? Food insecurity:     Worry: Not on file     Inability: Not on file   ???  Transportation needs:     Medical: Not on file     Non-medical: Not on file   Tobacco Use   ??? Smoking status: Former Smoker     Packs/day: 0.50     Years: 45.00     Pack years: 22.50     Types: Cigarettes   ??? Smokeless tobacco: Current User   Substance and Sexual Activity   ??? Alcohol use: Yes     Comment: monthly   ??? Drug use: No   ??? Sexual activity: Not on file   Lifestyle   ??? Physical activity:     Days per week: Not on file     Minutes per session: Not on file   ??? Stress: Not on file   Relationships   ??? Social connections:     Talks on phone: Not on file     Gets together: Not on file     Attends religious service: Not on file     Active member of club or organization: Not on file  Attends meetings of clubs or organizations: Not on file     Relationship status: Not on file   ??? Intimate partner violence:     Fear of current or ex partner: Not on file     Emotionally abused: Not on file     Physically abused: Not on file     Forced sexual activity: Not on file   Other Topics Concern   ??? Military Service Not Asked   ??? Blood Transfusions Not Asked   ??? Caffeine Concern Not Asked   ??? Occupational Exposure Not Asked   ??? Hobby Hazards Not Asked   ??? Sleep Concern Not Asked   ??? Stress Concern Not Asked   ??? Weight Concern Not Asked   ??? Special Diet Not Asked   ??? Back Care Not Asked   ??? Exercise Not Asked   ??? Bike Helmet Not Asked   ??? Seat Belt Not Asked   ??? Self-Exams Not Asked   Social History Narrative   ??? Not on file         ALLERGIES: Regadenoson; Adhesive tape-silicones; Isosorbide mononitrate; and Milnacipran    Review of Systems   Constitutional: Negative.  Negative for chills and fever.   HENT: Positive for congestion.    Eyes: Negative.    Respiratory: Positive for cough (Clear phlegm), chest tightness, shortness of breath and wheezing.    Cardiovascular: Positive for chest pain and palpitations. Negative for leg swelling.   Gastrointestinal: Negative.  Negative for abdominal pain, diarrhea, nausea and vomiting.    Genitourinary: Negative.  Negative for dysuria and flank pain.   Musculoskeletal: Positive for back pain (Chronic).   Skin: Negative.    Neurological: Negative.    All other systems reviewed and are negative.      Vitals:    03/20/18 1117 03/20/18 1132 03/20/18 1202 03/20/18 1414   BP:  114/60 105/55    Pulse:  77 76    Resp:  28 24    Temp:       SpO2: 92% (!) 89% 91% 93%   Weight:       Height:                Physical Exam  Vitals signs and nursing note reviewed. Exam conducted with a chaperone present.   Constitutional:       Appearance: She is well-developed.   HENT:      Head: Normocephalic and atraumatic.      Nose: Nose normal.   Eyes:      Extraocular Movements: Extraocular movements intact.      Conjunctiva/sclera: Conjunctivae normal.      Pupils: Pupils are equal, round, and reactive to light.   Neck:      Musculoskeletal: Normal range of motion and neck supple.   Cardiovascular:      Rate and Rhythm: Normal rate and regular rhythm.      Pulses: Normal pulses.           Dorsalis pedis pulses are 2+ on the right side and 2+ on the left side.      Heart sounds: Normal heart sounds.   Pulmonary:      Effort: Prolonged expiration and respiratory distress (Mild) present. No tachypnea, bradypnea, accessory muscle usage or retractions.      Breath sounds: No stridor, decreased air movement or transmitted upper airway sounds. Wheezing (Diffuse bilateral expiratory wheezes) present. No decreased breath sounds, rhonchi or rales.   Abdominal:  General: Bowel sounds are normal. There is no distension.      Palpations: Abdomen is soft.      Tenderness: There is no tenderness. There is no guarding or rebound.   Musculoskeletal: Normal range of motion.         General: No swelling or tenderness.      Right lower leg: No edema.      Left lower leg: No edema.   Skin:     General: Skin is warm and dry.      Capillary Refill: Capillary refill takes less than 2 seconds.   Neurological:      General: No focal deficit  present.      Mental Status: She is alert and oriented to person, place, and time.      Deep Tendon Reflexes: Reflexes are normal and symmetric.          MDM   EKG: NSR. HR:83. No STEMI.     Recent Results (from the past 12 hour(s))   CBC WITH AUTOMATED DIFF    Collection Time: 03/20/18 11:05 AM   Result Value Ref Range    WBC 7.4 4.0 - 11.0 1000/mm3    RBC 4.33 3.60 - 5.20 M/uL    HGB 13.7 13.0 - 17.2 gm/dl    HCT 42.1 37.0 - 50.0 %    MCV 97.2 80.0 - 98.0 fL    MCH 31.6 25.4 - 34.6 pg    MCHC 32.5 30.0 - 36.0 gm/dl    PLATELET 207 140 - 450 1000/mm3    MPV 10.1 (H) 6.0 - 10.0 fL    RDW-SD 43.7 36.4 - 46.3      NRBC 0 0 - 0      IMMATURE GRANULOCYTES 0.5 0.0 - 3.0 %    NEUTROPHILS 61.8 34 - 64 %    LYMPHOCYTES 30.8 28 - 48 %    MONOCYTES 4.9 1 - 13 %    EOSINOPHILS 1.6 0 - 5 %    BASOPHILS 0.4 0 - 3 %   METABOLIC PANEL, BASIC    Collection Time: 03/20/18 11:05 AM   Result Value Ref Range    Sodium 138 136 - 145 mEq/L    Potassium 4.2 3.5 - 5.1 mEq/L    Chloride 104 98 - 107 mEq/L    CO2 27 21 - 32 mEq/L    Glucose 182 (H) 74 - 106 mg/dl    BUN 19 7 - 25 mg/dl    Creatinine 1.0 0.6 - 1.3 mg/dl    GFR est AA >60.0      GFR est non-AA 60      Calcium 8.8 8.5 - 10.1 mg/dl    Anion gap 7 5 - 15 mmol/L   TROPONIN I    Collection Time: 03/20/18 11:05 AM   Result Value Ref Range    Troponin-I <0.015 0.000 - 0.045 ng/ml   NT-PRO BNP    Collection Time: 03/20/18 11:05 AM   Result Value Ref Range    NT pro-BNP 38.0 0.0 - 125.0 pg/ml   INFLUENZA A/B, BY PCR    Collection Time: 03/20/18  1:30 PM   Result Value Ref Range    Influenza A PCR NEGATIVE NEGATIVE      Influenza B PCR NEGATIVE NEGATIVE       Xr Chest Sngl V    Result Date: 03/20/2018  IMPRESSION: Portable AP upright view the chest exposed at 11:00 AM March 20, 2018 reveals the lungs are clear.  The heart is of normal size. Lower anterior cervical fusion hardware is in place.Maude Leriche Chest W Or W Wo Cont    Result Date: 03/20/2018  Impression: No aortic dissection or  pulmonary embolism.     Comment: 3D CTA images of the chest were performed with intravenous contrast. Multiplanar PE protocol was utilized and MIPS projections were obtained.  This was compared with low-dose CT of October 28, 2017 and October 29, 2016. There is no evidence of pulmonary embolism or aortic dissection.  The aorta is of normal caliber. The heart is of normal size.  No pleural or pericardial effusion. Mildly prominent right hilar and to lesser extent left hilar lymph nodes are observed. No other adenopathy. Groundglass granularity is present throughout both lungs. No pulmonary consolidation or mass. DICOM format imaged data is available to non-affiliated external healthcare facilities or entities on a secure, media free, reciprocally searchable basis with patient authorization  for 12 months following the date of the study.      1:03 PM Work-up is unremarkable.  Upon reevaluation patient continues with wheezes and shortness of breath after treatment.  I will continue nebulizer treatment and reassess.    2:29 PM After another breathing treatment patient still wheezing and with pulse ox in the low 90s despite supplemental oxygen.  I will discuss admission with hospitalist for further treatment of COPD exacerbation.    2:40 PM D/w Dr. Tonye Royalty will admit to med telemetry.    ProceduresNA        ICD-10-CM ICD-9-CM   1. Chronic obstructive pulmonary disease with acute exacerbation (HCC) J44.1 491.21

## 2018-03-20 NOTE — Progress Notes (Signed)
Results for Hannah [name], Hannah [name] (MRN [id number]) as of 03/20/2018 15:26   Ref. Range 03/20/2018 15:18   pH Latest Ref Range: 7.350 - 7.450   7.384   PCO2 Latest Ref Range: 35.0 - 45.0 mm Hg 39.6   PO2 Latest Ref Range: 75 - 100 mm Hg 75.0   BICARBONATE Latest Ref Range: 18.0 - 26.0 mmol/L 23.6   O2 SAT Latest Ref Range: 90 - 100 % 95.0   BASE EXCESS Latest Ref Range: -2 - 3 mmol/L -1   SITE Latest Units:   R Radial   Sample type Latest Units:   Art   ALLENS TEST Latest Units:   Pass   DEVICE Latest Units:   Nasal Can   Patient temp. Latest Units:   98.7

## 2018-03-20 NOTE — ED Notes (Signed)
Pt reports that she has started feeling bad the day after new years after the grandkids left.  Pt took sudafed and thought it made her blood pressure go up so she quit taking .  Has not been sleeping well the last few nights, has been waking up short of breath and with heart palpiations.  Does have a history of blood clots.  Pt takes low dose aspirin every night.  Pt reports taking losartan and carvidolol which has helped her decrease her blood pressure and pulse since this morning. Pt pulse steady at 78-80 in  Triage, O2 at 94%, Does take inhalers but not helping.  Was on anitibiotics before christmas for bacterial infection in intestines, has follow up appot on the 13th regarding this issue.

## 2018-03-20 NOTE — H&P (Signed)
H&P by Jonnie Kind, DO at 03/20/18 1435                Author: Jonnie Kind, DO  Service: Hospitalist  Author Type: Physician       Filed: 03/20/18 1459  Date of Service: 03/20/18 1435  Status: Signed          Editor: Jonnie Kind, DO (Physician)                          Admission History and Physical   Jonnie Kind D.O.           Patient: Hannah [name]  Age: 63 y.o.  Sex: female          Date of Birth: Apr 04, 1955  Admit Date: 03/20/2018  Admit Doctor: No admitting provider for patient encounter.         MRN: [id number]   CSN: [id number]   PCP: Christa See, MD             Assessment / Plan     Acute hypoxic respiratory failure second to COPD exacerbation   Acute bronchitis   COPD exacerbation secondary to above   Tobacco abuse   Chronic diastolic congestive heart failure without evidence of acute exacerbation   GERD   Hypertension   Anxiety   Seizure disorder   Chronic pain      Plan:   -Admit to telemetry   -Check ABG   -Continue Solu-Medrol, DuoNeb scheduled, Symbicort, start azithromycin for bacterial bronchitis coverage   -Nicotine patch   -Continue cardiac regimen for CHF including Coreg, aspirin, Lasix, losartan   -Continue home Protonix for reflux   -Trend troponins given patient complaint of palpitations and chest pain, this is likely exacerbated due to her coughing and is more pleuritic in nature   -Continue home Keppra 1000 twice daily for seizure disorder   -Continue home anxiety and pain regimen including Valium as needed, Zanaflex nightly and Cymbalta      Diet: Cardiac   DVT prophylaxis: Heparin         DISPO   -Pt to be admitted  at this time for reasons addressed above, continued hospitalization for ongoing assessment and treatment indicated       Anticipated Date of Discharge: 03/22/2018   Anticipated Disposition (home, SNF) : Home      Code Status: Full code      MPOA:Son, [phone]         Smoking cessation discussed and encouraged. Risks of potentially life threatening  consequences of continued smoking discussed. Patient is amenable to nicotine patch, but not yet willing  to set a quit date. Total time counselling 6 min.            Chief Complaint:      Chief Complaint       Patient presents with        ?  Shortness of Breath        ?  Palpitations                HPI:     Hannah [name] is a 63 y.o.  year old female medical history of chronic diastolic congestive heart failure, COPD, tobacco abuse who continues to smoke, GERD, hypertension,  anxiety and seizure disorder presents from home today accompanied by her sister with whom she lives in her son.  Patient complains of approximately 1 week of gradually worsening shortness of breath and chest  tightness.  She states her symptoms began as  nasal congestion for about 3 days a week ago associated with fever and chills at that time, she took over-the-counter Sudafed and it caused her to have significant palpitations.  She states since then her shortness of breath and palpitations have worsened,  she has a cough with productive clear mucus, no recent fever or chills.  Denies any leg swelling or orthopnea symptoms.  When asked how many times she takes her albuterol she states "all the time ", estimates using her albuterol least 6-7 times today  prior to arrival to the emergency room without relief of her chest tightness and wheezing.      ER course: Patient received duo nebs x2 Solu-Medrol 125 with subjective improvement in chest tightness.  Chest x-ray without infiltrate.  Patient  reported having a "prothrombotic state "denies being told of any DVT or PE however, unclear etiology for this diagnosis therefore CTA was done which did not reveal aortic dissection or PE but was consistent with prominent right hilar lymphadenopathy as  well as left hilar lymphadenopathy and groundglass appearance in the bilateral lungs.  Patient was hypoxic down to 87% room air, was started on 3 L nasal cannula with improvement to 9394%.  No prior oxygen  requirement.      Review of Systems -    Review of Systems    Constitutional: Positive for malaise/fatigue. Negative for chills and fever.    HENT: Negative for congestion, ear pain, sinus pain and sore throat.     Eyes: Negative for blurred vision and photophobia.    Respiratory: Positive for cough, shortness of breath  and wheezing.     Cardiovascular: Negative for chest pain, palpitations and leg swelling.    Gastrointestinal: Negative for abdominal pain, constipation, diarrhea, heartburn, nausea and vomiting.    Genitourinary: Negative for dysuria and flank pain.    Musculoskeletal: Negative for myalgias.    Skin: Negative for itching and rash.    Neurological: Negative for dizziness, weakness and headaches.    Endo/Heme/Allergies: Negative for polydipsia.    Psychiatric/Behavioral: Negative for depression. The patient is not nervous/anxious.              Past Medical History:     Past Medical History:        Diagnosis  Date         ?  Anginal pain ([place])       ?  Anxiety and depression       ?  CHF (congestive heart failure) (HCC)            muscle         ?  Emphysema/COPD [facility])       ?  Fibromyalgia       ?  GERD (gastroesophageal reflux disease)       ?  Hiatal hernia       ?  Hypercoagulable state ([place])       ?  Hyperlipidemia       ?  Hypertension       ?  Nicotine vapor product user       ?  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  Tachycardia             Past Surgical History:     Past Surgical History:         Procedure  Laterality  Date          ?  HX BREAST BIOPSY  Right  12/06/2016          EXCISION OF RIGHT  BREAST MASS X2 performed by Joseph Pierini, MD at [place]          ?  HX CARPAL TUNNEL RELEASE  Right       ?  HX CHOLECYSTECTOMY         ?  HX ORTHOPAEDIC    2016          plate and cadaver bone placed in neck          ?  HX TUBAL LIGATION               Family History:   History reviewed. No pertinent family history.      Social History:     Social History           Socioeconomic History         ?  Marital status:  WIDOWED              Spouse name:  Not on file         ?  Number of children:  Not on file     ?  Years of education:  Not on file     ?  Highest education level:  Not on file       Tobacco Use         ?  Smoking status:  Former Smoker              Packs/day:  0.50         Years:  45.00         Pack years:  22.50         Types:  Cigarettes         ?  Smokeless tobacco:  Current User       Substance and Sexual Activity         ?  Alcohol use:  Yes             Comment: monthly         ?  Drug use:  No        Other Topics  Concern           Home Medications:     Prior to Admission medications             Medication  Sig  Start Date  End Date  Taking?  Authorizing Provider            docosahexanoic acid/epa (FISH OIL PO)  Take  by mouth. [employer]      Yes  Other, Phys, MD     levETIRAcetam (KEPPRA) 500 mg tablet  Take 500 mg by mouth two (2) times a day. Indications: takes 750mg  at night      Yes  Provider, Historical     DULoxetine (CYMBALTA) 60 mg capsule  Take 60 mg by mouth daily.      Yes  Provider, Historical     furosemide (LASIX) 20 mg tablet  Take 20 mg by mouth daily.      Yes  Provider, Historical     potassium 99 mg tablet  Take 99 mg by mouth daily.      Yes  Provider, Historical     pantoprazole (PROTONIX) 40 mg tablet  Take 40 mg by mouth daily.  Yes  Provider, Historical     carvedilol (COREG) 12.5 mg tablet  Take 12.5 mg by mouth two (2) times daily (with meals).      Yes  Provider, Historical     aspirin delayed-release 81 mg tablet  Take  by mouth daily.      Yes  Provider, Historical     diazePAM (VALIUM) 5 mg tablet  Take 5 mg by mouth every eight (8) hours as needed for Anxiety.      Yes  Provider, Historical     losartan (COZAAR) 100 mg tablet  Take 100 mg by mouth nightly.      Yes  Provider, Historical     tiZANidine (ZANAFLEX) 2 mg capsule  Take 2 mg by mouth nightly as needed.      Yes  Provider, Historical     estrogen,  conjugated,-medroxyPROGESTERone (PREMPRO) 0.3-1.5 mg tab  Take 1 Tab by mouth daily.      Yes  Provider, Historical     albuterol (PROAIR HFA) 90 mcg/actuation inhaler  Take  by inhalation.      Yes  Provider, Historical     budesonide-formoterol (SYMBICORT) 160-4.5 mcg/actuation HFAA  Take 2 Puffs by inhalation two (2) times a day.      Yes  Provider, Historical     diclofenac (VOLTAREN) 1 % gel  Apply  to affected area four (4) times daily.      Yes  Provider, Historical            lidocaine (LIDODERM) 5 %  by TransDERmal route every twenty-four (24) hours. Apply patch to the affected area for 12 hours a day and remove for 12 hours a day.      Yes  Provider, Historical           Allergies:     Allergies        Allergen  Reactions         ?  Regadenoson  Other (comments)             Trouble breathing   Trouble breathing            ?  Adhesive Tape-Silicones  Itching     ?  Isosorbide Mononitrate  Palpitations     ?  Milnacipran  Other (comments)             REACTION: tachycardia   REACTION: tachycardia                   Physical Exam:        Visit Vitals      BP  105/55     Pulse  76     Temp  97.9 ??F (36.6 ??C)     Resp  24     Ht  5\' 4"  (1.626 m)     Wt  92.5 kg (204 lb)     SpO2  93%        BMI  35.02 kg/m??        Physical Exam   Constitutional :        General: She is not in acute distress.     Appearance: She is not diaphoretic.    HENT:       Head: Normocephalic and atraumatic.      Right Ear: External ear normal.      Left Ear: External ear normal.      Nose: Nose normal.      Mouth/Throat:  Dentition: Normal dentition.   Eyes:       General:         Right eye: No discharge.         Left eye: No discharge.      Conjunctiva/sclera: Conjunctivae normal.      Pupils: Pupils are equal, round, and reactive to light.   Neck:       Musculoskeletal: Neck supple.      Thyroid: No thyromegaly.      Vascular: No JVD.    Cardiovascular:       Rate and Rhythm: Normal rate and regular rhythm.      Heart sounds: Normal  heart sounds. No murmur. No friction rub.    Pulmonary:       Effort: Tachypnea present.      Breath sounds: Examination of the right-upper field reveals  wheezing. Examination of the left-upper field reveals wheezing.  Wheezing present.   Abdominal:      General: Bowel sounds are normal. There is no distension.       Palpations: Abdomen is soft.      Tenderness: There is no tenderness. There is no guarding or rebound.     Musculoskeletal: Normal range of motion.    Skin:      General: Skin is warm and dry.   Neurological :       Mental Status: She is alert and oriented to person, place, and time.      Cranial Nerves: No cranial nerve deficit.      Coordination: Coordination normal.      Deep Tendon Reflexes: Reflexes are normal and symmetric.   Psychiatric:          Mood and Affect: Mood and affect normal.         Cognition and Memory: Memory normal.         Judgment: Judgment normal.             Intake and Output:   Current Shift:  No intake/output data recorded.   Last three shifts:  No intake/output data recorded.      Lab/Data Reviewed:     Recent Results (from the past 24 hour(s))     CBC WITH AUTOMATED DIFF          Collection Time: 03/20/18 11:05 AM         Result  Value  Ref Range            WBC  7.4  4.0 - 11.0 1000/mm3       RBC  4.33  3.60 - 5.20 M/uL       HGB  13.7  13.0 - 17.2 gm/dl       HCT  42.1  37.0 - 50.0 %       MCV  97.2  80.0 - 98.0 fL       MCH  31.6  25.4 - 34.6 pg       MCHC  32.5  30.0 - 36.0 gm/dl       PLATELET  207  140 - 450 1000/mm3       MPV  10.1 (H)  6.0 - 10.0 fL       RDW-SD  43.7  36.4 - 46.3         NRBC  0  0 - 0         IMMATURE GRANULOCYTES  0.5  0.0 - 3.0 %       NEUTROPHILS  61.8  34 -  64 %       LYMPHOCYTES  30.8  28 - 48 %       MONOCYTES  4.9  1 - 13 %       EOSINOPHILS  1.6  0 - 5 %       BASOPHILS  0.4  0 - 3 %       METABOLIC PANEL, BASIC          Collection Time: 03/20/18 11:05 AM         Result  Value  Ref Range            Sodium  138  136 - 145 mEq/L        Potassium  4.2  3.5 - 5.1 mEq/L       Chloride  104  98 - 107 mEq/L       CO2  27  21 - 32 mEq/L       Glucose  182 (H)  74 - 106 mg/dl       BUN  19  7 - 25 mg/dl       Creatinine  1.0  0.6 - 1.3 mg/dl       GFR est AA  >60.0          GFR est non-AA  60          Calcium  8.8  8.5 - 10.1 mg/dl       Anion gap  7  5 - 15 mmol/L       TROPONIN I          Collection Time: 03/20/18 11:05 AM         Result  Value  Ref Range            Troponin-I  <0.015  0.000 - 0.045 ng/ml       NT-PRO BNP          Collection Time: 03/20/18 11:05 AM         Result  Value  Ref Range            NT pro-BNP  38.0  0.0 - 125.0 pg/ml       INFLUENZA A/B, BY PCR          Collection Time: 03/20/18  1:30 PM         Result  Value  Ref Range            Influenza A PCR  NEGATIVE  NEGATIVE              Influenza B PCR  NEGATIVE  NEGATIVE          XR Results (most recent):     Results from Hospital Encounter encounter on 03/20/18     XR CHEST SNGL V           Narrative  Indication: sob.    .              Impression  IMPRESSION: Portable AP upright view the chest exposed at 11:00 AM March 20, 2018 reveals the lungs are clear. The heart is of normal size. Lower anterior   cervical fusion hardware is in place..                CT Results (most recent):     Results from [facility] encounter on 03/20/18     CTA CHEST W OR W WO CONT           Narrative  History: sob.  History of hypercoagulable state..                  Impression  Impression: No aortic dissection or pulmonary embolism.           Comment: 3D CTA images of the chest were performed with intravenous contrast.   Multiplanar PE protocol was utilized and MIPS projections were obtained.  This   was compared with low-dose CT of October 28, 2017 and October 29, 2016.      There is no evidence of pulmonary embolism or aortic dissection.  The aorta is   of normal caliber. The heart is of normal size.  No pleural or pericardial   effusion.      Mildly prominent right hilar and to lesser extent  left hilar lymph nodes are   observed. No other adenopathy.      Groundglass granularity is present throughout both lungs. No pulmonary   consolidation or mass.      DICOM format imaged data is available to non-affiliated external healthcare   facilities or entities on a secure, media free, reciprocally searchable basis   with patient authorization  for 12 months following the date of the study.                           Jonnie Kind, DO      March 20, 2018   2:35 PM      Dragon medical dictation software was used for portions of this report.   Unintended voice transcription errors may have occurred.

## 2018-03-20 NOTE — Progress Notes (Signed)
 [facility]: Medication History    Medication History Completed Prior to Order Reconciliation?  YES  If no and discrepancies were noted please contact attending physician or pharmacist to follow-up: N/A - Medication history was obtained prior to reconciliation.    Information obtained from (list all that apply, 2 sources preferred): Patient and Other: MEDICATION LIST   If a history was not reviewed directly with patient/caregiver please comment with the reason why: N/A     Antibiotic use in the last 90 days (3 months): NO      Missing Medication Identified  NO  Number of medications: -  Indicate action taken: N/A      Wrong Medication Identified NO   Number of medications: -  Indicate action taken: N/A      Wrong Dose/Interval/Route Identified NO              Number of medications: -   Indicate action taken: N/A        Is patient currently taking warfarin:  No        Medication Compliance Issues and/or Medication Concerns: N/A      Allergies: Regadenoson; Adhesive tape-silicones; Isosorbide mononitrate; and Milnacipran    Prior to Admission Medications:    Prior to Admission Medications   Prescriptions Last Dose Informant Patient Reported? Taking?   DULoxetine (CYMBALTA) 60 mg capsule   Yes Yes   Sig: Take 60 mg by mouth daily.   albuterol (PROAIR HFA) 90 mcg/actuation inhaler   Yes Yes   Sig: Take  by inhalation.   aspirin delayed-release 81 mg tablet   Yes Yes   Sig: Take  by mouth daily.   budesonide-formoterol (SYMBICORT) 160-4.5 mcg/actuation HFAA   Yes Yes   Sig: Take 2 Puffs by inhalation two (2) times a day.   carvedilol (COREG) 12.5 mg tablet   Yes Yes   Sig: Take 12.5 mg by mouth two (2) times daily (with meals).   diazePAM (VALIUM) 5 mg tablet   Yes Yes   Sig: Take 5 mg by mouth every eight (8) hours as needed for Anxiety.   diclofenac (VOLTAREN) 1 % gel   Yes Yes   Sig: Apply  to affected area four (4) times daily.   docosahexanoic acid/epa (FISH OIL PO)   Yes Yes   Sig: Take  by mouth. Red  Krill   estrogen, conjugated,-medroxyPROGESTERone (PREMPRO) 0.3-1.5 mg tab   Yes Yes   Sig: Take 1 Tab by mouth daily.   furosemide (LASIX) 20 mg tablet   Yes Yes   Sig: Take 20 mg by mouth daily.   levETIRAcetam (KEPPRA) 500 mg tablet   Yes Yes   Sig: Take 1,000 mg by mouth two (2) times a day. Indications: takes 750mg  at night   lidocaine (LIDODERM) 5 %   Yes Yes   Sig: by TransDERmal route every twenty-four (24) hours. Apply patch to the affected area for 12 hours a day and remove for 12 hours a day.   losartan (COZAAR) 100 mg tablet   Yes Yes   Sig: Take 100 mg by mouth nightly.   pantoprazole (PROTONIX) 40 mg tablet   Yes Yes   Sig: Take 40 mg by mouth daily.   potassium 99 mg tablet   Yes Yes   Sig: Take 99 mg by mouth daily.   tiZANidine (ZANAFLEX) 2 mg capsule   Yes Yes   Sig: Take 2 mg by mouth nightly as needed.      Facility-Administered  Medications: None         Hannah KIDD [place]   Contact: [phone]

## 2018-03-21 LAB — CBC
Hematocrit: 40.9 % (ref 37.0–50.0)
Hemoglobin: 13.3 gm/dl (ref 13.0–17.2)
MCH: 31.4 pg (ref 25.4–34.6)
MCHC: 32.5 gm/dl (ref 30.0–36.0)
MCV: 96.7 fL (ref 80.0–98.0)
MPV: 10.2 fL — ABNORMAL HIGH (ref 6.0–10.0)
Platelets: 209 10*3/uL (ref 140–450)
RBC: 4.23 M/uL (ref 3.60–5.20)
RDW-SD: 42.6 (ref 36.4–46.3)
WBC: 14.6 10*3/uL — ABNORMAL HIGH (ref 4.0–11.0)

## 2018-03-21 LAB — COMPREHENSIVE METABOLIC PANEL
ALT: 40 U/L (ref 12–78)
AST: 18 U/L (ref 15–37)
Albumin: 3.4 gm/dl (ref 3.4–5.0)
Alkaline Phosphatase: 88 U/L (ref 45–117)
Anion Gap: 7 mmol/L (ref 5–15)
BUN: 14 mg/dl (ref 7–25)
CO2: 26 mEq/L (ref 21–32)
Calcium: 9 mg/dl (ref 8.5–10.1)
Chloride: 106 mEq/L (ref 98–107)
Creatinine: 0.8 mg/dl (ref 0.6–1.3)
EGFR IF NonAfrican American: >60
GFR African American: >60
Glucose: 156 mg/dl — ABNORMAL HIGH (ref 74–106)
Potassium: 4.1 mEq/L (ref 3.5–5.1)
Sodium: 138 mEq/L (ref 136–145)
Total Bilirubin: 1 mg/dl (ref 0.2–1.0)
Total Protein: 6.7 gm/dl (ref 6.4–8.2)

## 2018-03-21 LAB — LIPID PANEL
CHOL/HDL Ratio: 7.3 Ratio — ABNORMAL HIGH (ref 0.0–4.4)
Chol/HDL Ratio: 7.3 Ratio — ABNORMAL HIGH (ref 0.0–4.4)
Cholesterol, Total: 305 mg/dl — ABNORMAL HIGH (ref 140–199)
Cholesterol, total: 305 mg/dl — ABNORMAL HIGH (ref 140–199)
HDL Cholesterol: 42 mg/dl (ref 40–96)
HDL: 42 mg/dl (ref 40–96)
LDL Calculated: 195 mg/dl — ABNORMAL HIGH (ref 0–130)
LDL, calculated: 195 mg/dl — ABNORMAL HIGH (ref 0–130)
Triglyceride: 342 mg/dl — ABNORMAL HIGH (ref 29–150)
Triglycerides: 342 mg/dl — ABNORMAL HIGH (ref 29–150)

## 2018-03-21 LAB — TSH 3RD GENERATION
TSH: 0.618 u[IU]/mL (ref 0.358–3.740)
TSH: 0.618 u[IU]/mL (ref 0.358–3.740)

## 2018-03-21 LAB — TROPONIN
Troponin I: <0.015 ng/ml (ref 0.000–0.045)
Troponin I: <0.015 ng/ml (ref 0.000–0.045)

## 2018-03-21 LAB — METABOLIC PANEL, COMPREHENSIVE
ALT (SGPT): 40 U/L (ref 12–78)
AST (SGOT): 18 U/L (ref 15–37)
Albumin: 3.4 gm/dl (ref 3.4–5.0)
Alk. phosphatase: 88 U/L (ref 45–117)
Anion gap: 7 mmol/L (ref 5–15)
BUN: 14 mg/dl (ref 7–25)
Bilirubin, total: 1 mg/dl (ref 0.2–1.0)
CO2: 26 mEq/L (ref 21–32)
Calcium: 9 mg/dl (ref 8.5–10.1)
Chloride: 106 mEq/L (ref 98–107)
Creatinine: 0.8 mg/dl (ref 0.6–1.3)
GFR est AA: >60
GFR est non-AA: >60
Glucose: 156 mg/dl — ABNORMAL HIGH (ref 74–106)
Potassium: 4.1 mEq/L (ref 3.5–5.1)
Protein, total: 6.7 gm/dl (ref 6.4–8.2)
Sodium: 138 mEq/L (ref 136–145)

## 2018-03-21 LAB — CBC W/O DIFF
HCT: 40.9 % (ref 37.0–50.0)
HGB: 13.3 gm/dl (ref 13.0–17.2)
MCH: 31.4 pg (ref 25.4–34.6)
MCHC: 32.5 gm/dl (ref 30.0–36.0)
MCV: 96.7 fL (ref 80.0–98.0)
MPV: 10.2 fL — ABNORMAL HIGH (ref 6.0–10.0)
PLATELET: 209 10*3/uL (ref 140–450)
RBC: 4.23 M/uL (ref 3.60–5.20)
RDW-SD: 42.6 (ref 36.4–46.3)
WBC: 14.6 10*3/uL — ABNORMAL HIGH (ref 4.0–11.0)

## 2018-03-21 LAB — TROPONIN I
Troponin-I: <0.015 ng/ml (ref 0.000–0.045)
Troponin-I: <0.015 ng/ml (ref 0.000–0.045)

## 2018-03-21 MED FILL — LEVETIRACETAM 500 MG TAB: 500 mg | ORAL | Qty: 2

## 2018-03-21 MED FILL — IPRATROPIUM-ALBUTEROL 2.5 MG-0.5 MG/3 ML NEB SOLUTION: 2.5 mg-0.5 mg/3 ml | RESPIRATORY_TRACT | Qty: 3

## 2018-03-21 MED FILL — ACETAMINOPHEN 325 MG TABLET: 325 mg | ORAL | Qty: 2

## 2018-03-21 MED FILL — SOLU-MEDROL (PF) 40 MG/ML SOLUTION FOR INJECTION: 40 mg/mL | INTRAMUSCULAR | Qty: 1

## 2018-03-21 MED FILL — ENOXAPARIN 40 MG/0.4 ML SUB-Q SYRINGE: 40 mg/0.4 mL | SUBCUTANEOUS | Qty: 0.4

## 2018-03-21 MED FILL — NICOTINE 14 MG/24 HR DAILY PATCH: 14 mg/24 hr | TRANSDERMAL | Qty: 1

## 2018-03-21 MED FILL — BD POSIFLUSH NORMAL SALINE 0.9 % INJECTION SYRINGE: INTRAMUSCULAR | Qty: 10

## 2018-03-21 MED FILL — TIZANIDINE 2 MG TAB: 2 mg | ORAL | Qty: 1

## 2018-03-21 MED FILL — CARVEDILOL 6.25 MG TAB: 6.25 mg | ORAL | Qty: 2

## 2018-03-21 MED FILL — ASPIRIN 81 MG TAB, DELAYED RELEASE: 81 mg | ORAL | Qty: 1

## 2018-03-21 MED FILL — LOSARTAN 50 MG TAB: 50 mg | ORAL | Qty: 2

## 2018-03-21 MED FILL — DIAZEPAM 5 MG TAB: 5 mg | ORAL | Qty: 1

## 2018-03-21 MED FILL — FUROSEMIDE 20 MG TAB: 20 mg | ORAL | Qty: 1

## 2018-03-21 MED FILL — AZITHROMYCIN 500 MG IV SOLUTION: 500 mg | INTRAVENOUS | Qty: 5

## 2018-03-21 MED FILL — DULOXETINE 30 MG CAP, DELAYED RELEASE: 30 mg | ORAL | Qty: 2

## 2018-03-21 MED FILL — PANTOPRAZOLE 40 MG TAB, DELAYED RELEASE: 40 mg | ORAL | Qty: 1

## 2018-03-21 MED FILL — FISH OIL 340 MG-1,000 MG CAPSULE: 340-1000 mg | ORAL | Qty: 1

## 2018-03-21 NOTE — Progress Notes (Signed)
Problem: Falls - Risk of  Goal: *Absence of Falls  Description  Document Hannah [name] Fall Risk and appropriate interventions in the flowsheet.  Outcome: Progressing Towards Goal  Note: Fall Risk Interventions:            Medication Interventions: Bed/chair exit alarm, Patient to call before getting OOB                   Problem: Patient Education: Go to Patient Education Activity  Goal: Patient/Family Education  Outcome: Progressing Towards Goal     Problem: Breathing Pattern - Ineffective  Goal: *Absence of hypoxia  Outcome: Progressing Towards Goal  Goal: *Use of effective breathing techniques  Outcome: Progressing Towards Goal     Problem: Gas Exchange - Impaired  Goal: *Absence of hypoxia  Outcome: Progressing Towards Goal     Problem: Patient Education: Go to Patient Education Activity  Goal: Patient/Family Education  Outcome: Progressing Towards Goal

## 2018-03-21 NOTE — Other (Signed)
Bedside shift change report given to Jonelle Sidle, Therapist, sports (oncoming nurse) by Lillia Pauls, RN (offgoing nurse). Report included the following information SBAR, Kardex, MAR, Recent Results and Cardiac Rhythm NSR.

## 2018-03-21 NOTE — Progress Notes (Signed)
INTERNAL MEDICINE                                                                   Daily Progress Note    Patient:  Hannah [name]  Today date: March 21, 2018  Date of Admission:  03/20/2018    Interval History and Events of the last 24 hours:  Shortness of breath with improved, nonproductive cough, no fever    Assessment/Plan:       Patient seen in follow up for multiple medical problems as listed below :  Acute hypoxic respiratory failure second to COPD exacerbation  Continue nasal cannula oxygen maintaining O2 sat greater than 88%  Continue bronchodilators with albuterol and Atrovent  IV Solu-Medrol    Acute bronchitis  Sputum culture if able to collection  Bag antibiotic with azithromycin    COPD exacerbation secondary to above  Continue treatment as above    Tobacco abuse  Smoking cessation discussed and encouraged. Risks of potentially life threatening consequences of continued smoking discussed. Patient is amenable to nicotine patch, but not yet willing to set a quit date. Total time counselling 10 min.    Chronic diastolic congestive heart failure without evidence of acute exacerbation  Continue Coreg, aspirin, Lasix, losartan    GERD on a PPI at home    Hypertension  Continue Coreg and losartan  Monitor pressure closely    Anxiety  Continue home anxiety and pain regimen including Valium as needed,     Seizure disorder  Continue home Keppra 1000 twice daily for seizure disorder    Chronic pain on Cymbalta and muscle relaxant    ??  DVT Prophylaxis:    Code Status:  .Full Code     Disposition and Family:   Recommend to continue hospitalization. Discussed with patient and nursing staff.    Anticipated Date of Discharge: TBD  Anticipated Disposition (home, SNF) : TBD    ROS     As H&P and Above  Labwork and Ancillary Studies     All labs/tests/imaging reviewed.Spoke with the nurse regarding patient issues.    Labwork:    CBC w/Diff   Lab Results    Component Value Date/Time    WBC 14.6 (H) 03/21/2018 07:16 AM    HGB 13.3 03/21/2018 07:16 AM    HCT 40.9 03/21/2018 07:16 AM    PLATELET 209 03/21/2018 07:16 AM    MCV 96.7 03/21/2018 07:16 AM        Basic Metabolic Profile   Lab Results   Component Value Date/Time    Sodium 138 03/21/2018 07:16 AM    Potassium 4.1 03/21/2018 07:16 AM    Chloride 106 03/21/2018 07:16 AM    CO2 26 03/21/2018 07:16 AM    Anion gap 7 03/21/2018 07:16 AM    Glucose 156 (H) 03/21/2018 07:16 AM    BUN 14 03/21/2018 07:16 AM    Creatinine 0.8 03/21/2018 07:16 AM    GFR est AA >60.0 03/21/2018 07:16 AM    GFR est non-AA >60 03/21/2018 07:16 AM    Calcium 9.0 03/21/2018 07:16 AM        Cardiac Enzymes   Lab Results   Component Value Date/Time    CK 206 (H) 11/26/2016 01:59 PM  CK-MB Index 0.6 04/22/2016 02:14 PM    Troponin-I <0.015 03/20/2018 11:30 PM      Arterial Blood Gases   Lab Results   Component Value Date/Time    PH 7.384 03/20/2018 03:18 PM    PCO2 39.6 03/20/2018 03:18 PM    PO2 75.0 03/20/2018 03:18 PM    HCO3 23.6 03/20/2018 03:18 PM      Coagulation    No results found for: APTT, PTP, INR, INREXT   Hepatic Function   Lab Results   Component Value Date/Time    AST (SGOT) 18 03/21/2018 07:16 AM    Alk. phosphatase 88 03/21/2018 07:16 AM            XR Results (most recent):  Results from [facility] encounter on 03/20/18   XR CHEST SNGL V    Narrative Indication: sob.    .      Impression IMPRESSION: Portable AP upright view the chest exposed at 11:00 AM March 20, 2018 reveals the lungs are clear. The heart is of normal size. Lower anterior  cervical fusion hardware is in place.Marland Kitchen              MRI Results (most recent):  No results found for this or any previous visit.    CT Results (most recent):  Results from [facility] Encounter encounter on 03/20/18   CTA CHEST W OR W WO CONT    Narrative History: sob.  History of hypercoagulable state..          Impression Impression: No aortic dissection or pulmonary embolism.          Comment: 3D CTA images of the chest were performed with intravenous contrast.  Multiplanar PE protocol was utilized and MIPS projections were obtained.  This  was compared with low-dose CT of October 28, 2017 and October 29, 2016.    There is no evidence of pulmonary embolism or aortic dissection.  The aorta is  of normal caliber. The heart is of normal size.  No pleural or pericardial  effusion.    Mildly prominent right hilar and to lesser extent left hilar lymph nodes are  observed. No other adenopathy.    Groundglass granularity is present throughout both lungs. No pulmonary  consolidation or mass.    DICOM format imaged data is available to non-affiliated external healthcare  facilities or entities on a secure, media free, reciprocally searchable basis  with patient authorization  for 12 months following the date of the study.                  Current Inpatient Meds and Allergies     Medications:  Current Facility-Administered Medications   Medication Dose Route Frequency   ??? albuterol-ipratropium (DUO-NEB) 2.5 MG-0.5 MG/3 ML  3 mL Nebulization Q6H RT   ??? aspirin delayed-release tablet 81 mg  81 mg Oral DAILY   ??? fluticasone-vilanterol (BREO ELLIPTA) 278mg-25mcg/puff  1 Puff Inhalation DAILY   ??? carvedilol (COREG) tablet 12.5 mg  12.5 mg Oral BID WITH MEALS   ??? diazePAM (VALIUM) tablet 5 mg  5 mg Oral Q8H PRN   ??? fish oil-omega-3 fatty acids 340-1,000 mg capsule 1 Cap  1 Cap Oral DAILY   ??? DULoxetine (CYMBALTA) capsule 60 mg  60 mg Oral DAILY   ??? furosemide (LASIX) tablet 20 mg  20 mg Oral DAILY   ??? levETIRAcetam (KEPPRA) tablet 1,000 mg  1,000 mg Oral BID   ??? losartan (COZAAR) tablet 100 mg  100  mg Oral QHS   ??? pantoprazole (PROTONIX) tablet 40 mg  40 mg Oral DAILY   ??? tiZANidine (ZANAFLEX) tablet 2 mg  2 mg Oral QHS PRN   ??? sodium chloride (NS) flush 5-10 mL  5-10 mL IntraVENous Q8H   ??? sodium chloride (NS) flush 5-10 mL  5-10 mL IntraVENous PRN    ??? naloxone (NARCAN) injection 0.1 mg  0.1 mg IntraVENous PRN   ??? acetaminophen (TYLENOL) tablet 650 mg  650 mg Oral Q6H PRN   ??? enoxaparin (LOVENOX) injection 40 mg  40 mg SubCUTAneous Q24H   ??? azithromycin (ZITHROMAX) 500 mg in 0.9% sodium chloride 250 mL IVPB  500 mg IntraVENous Q24H   ??? nicotine (NICODERM CQ) 14 mg/24 hr patch 1 Patch  1 Patch TransDERmal DAILY   ??? methylPREDNISolone (PF) (SOLU-MEDROL) injection 40 mg  40 mg IntraVENous Q8H          Objective:       Visit Vitals  BP 136/78 (BP 1 Location: Right arm, BP Patient Position: Sitting)   Pulse (!) 101   Temp 97.3 ??F (36.3 ??C)   Resp 16   Ht 5' 4"  (1.626 m)   Wt 94.5 kg (208 lb 5.4 oz)   SpO2 93%   BMI 35.76 kg/m??     Body mass index is 35.76 kg/m??.    No intake or output data in the 24 hours ending 03/21/18 1115    General:   Alert, cooperative, no distress, appears stated age.    Lungs:     Decreased BS rhonchi,    Chest wall:   No tenderness or deformity.    Heart:   Regular rate and rhythm, S1, S2 normal, no murmur, click, rub or gallop.    Abdomen:    Soft, non-tender. Bowel sounds normal. No masses,  No organomegaly.        Musculoskeleta : Normal range of motion in most of the joints   Extremities:  Extremities normal, atraumatic, no cyanosis or edema.    Pulses:  2+ and symmetric all extremities.    Skin:  Skin color, texture, turgor normal. No rashes or lesions    Neurologic  Psych:  : CNII-XII intact.    Normal affect and mood . No thoughts of harm to self or others              Total time spent with chart review, patient examination/education, discussion with staff on case,documentation and medication management / adjustment:   >35 minutes    It is always a pleasure to be involved in the clinical care of this patient.    Insurance risk surveyor medical dictation software was used for portions of this report.  Unintended voice transcription errors may have occurred.)    Otila Kluver, MD  March 21, 2018  [facility] Internal Medicine  Pager:  [phone]

## 2018-03-21 NOTE — Progress Notes (Signed)
Problem: Falls - Risk of  Goal: *Absence of Falls  Description  Document Patrcia Dolly Fall Risk and appropriate interventions in the flowsheet.  Outcome: Progressing Towards Goal  Note: Fall Risk Interventions:            Medication Interventions: Patient to call before getting OOB(refused bed alarm on)                   Problem: Patient Education: Go to Patient Education Activity  Goal: Patient/Family Education  Outcome: Progressing Towards Goal     Problem: Breathing Pattern - Ineffective  Goal: *Absence of hypoxia  Outcome: Progressing Towards Goal  Goal: *Use of effective breathing techniques  Outcome: Progressing Towards Goal     Problem: Gas Exchange - Impaired  Goal: *Absence of hypoxia  Outcome: Progressing Towards Goal     Problem: Patient Education: Go to Patient Education Activity  Goal: Patient/Family Education  Outcome: Progressing Towards Goal

## 2018-03-21 NOTE — Progress Notes (Signed)
INTERNAL MEDICINE                                                                   Daily Progress Note    Patient:  Hannah [name]  Today date: March 21, 2018  Date of Admission:  03/20/2018    Interval History and Events of the last 24 hours:  Shortness of breath with improved, nonproductive cough, no fever    Assessment/Plan:       Patient seen in follow up for multiple medical problems as listed below :  Acute hypoxic respiratory failure second to COPD exacerbation  Continue nasal cannula oxygen maintaining O2 sat greater than 88%  Continue bronchodilators with albuterol and Atrovent  IV Solu-Medrol    Acute bronchitis  Sputum culture if able to collection  Bag antibiotic with azithromycin    COPD exacerbation secondary to above  Continue treatment as above    Tobacco abuse  Smoking cessation discussed and encouraged. Risks of potentially life threatening consequences of continued smoking discussed. Patient is amenable to nicotine patch, but not yet willing to set a quit date. Total time counselling 10 min.    Chronic diastolic congestive heart failure without evidence of acute exacerbation  Continue Coreg, aspirin, Lasix, losartan    GERD on a PPI at home    Hypertension  Continue Coreg and losartan  Monitor pressure closely    Anxiety  Continue home anxiety and pain regimen including Valium as needed,     Seizure disorder  Continue home Keppra 1000 twice daily for seizure disorder    Chronic pain on Cymbalta and muscle relaxant    ??  DVT Prophylaxis:    Code Status:  .Full Code     Disposition and Family:   Recommend to continue hospitalization. Discussed with patient and nursing staff.    Anticipated Date of Discharge: TBD  Anticipated Disposition (home, SNF) : TBD    ROS     As H&P and Above  Labwork and Ancillary Studies     All labs/tests/imaging reviewed.Spoke with the nurse regarding patient issues.    Labwork:    CBC w/Diff   Lab Results    Component Value Date/Time    WBC 14.6 (H) 03/21/2018 07:16 AM    HGB 13.3 03/21/2018 07:16 AM    HCT 40.9 03/21/2018 07:16 AM    PLATELET 209 03/21/2018 07:16 AM    MCV 96.7 03/21/2018 07:16 AM        Basic Metabolic Profile   Lab Results   Component Value Date/Time    Sodium 138 03/21/2018 07:16 AM    Potassium 4.1 03/21/2018 07:16 AM    Chloride 106 03/21/2018 07:16 AM    CO2 26 03/21/2018 07:16 AM    Anion gap 7 03/21/2018 07:16 AM    Glucose 156 (H) 03/21/2018 07:16 AM    BUN 14 03/21/2018 07:16 AM    Creatinine 0.8 03/21/2018 07:16 AM    GFR est AA >60.0 03/21/2018 07:16 AM    GFR est non-AA >60 03/21/2018 07:16 AM    Calcium 9.0 03/21/2018 07:16 AM        Cardiac Enzymes   Lab Results   Component Value Date/Time    CK 206 (H) 11/26/2016 01:59 PM  CK-MB Index 0.6 04/22/2016 02:14 PM    Troponin-I <0.015 03/20/2018 11:30 PM      Arterial Blood Gases   Lab Results   Component Value Date/Time    PH 7.384 03/20/2018 03:18 PM    PCO2 39.6 03/20/2018 03:18 PM    PO2 75.0 03/20/2018 03:18 PM    HCO3 23.6 03/20/2018 03:18 PM      Coagulation    No results found for: APTT, PTP, INR, INREXT   Hepatic Function   Lab Results   Component Value Date/Time    AST (SGOT) 18 03/21/2018 07:16 AM    Alk. phosphatase 88 03/21/2018 07:16 AM            XR Results (most recent):  Results from [facility] encounter on 03/20/18   XR CHEST SNGL V    Narrative Indication: sob.    .      Impression IMPRESSION: Portable AP upright view the chest exposed at 11:00 AM March 20, 2018 reveals the lungs are clear. The heart is of normal size. Lower anterior  cervical fusion hardware is in place.Marland Kitchen              MRI Results (most recent):  No results found for this or any previous visit.    CT Results (most recent):  Results from Hospital Encounter encounter on 03/20/18   CTA CHEST W OR W WO CONT    Narrative History: sob.  History of hypercoagulable state..          Impression Impression: No aortic dissection or pulmonary embolism.          Comment: 3D CTA images of the chest were performed with intravenous contrast.  Multiplanar PE protocol was utilized and MIPS projections were obtained.  This  was compared with low-dose CT of October 28, 2017 and October 29, 2016.    There is no evidence of pulmonary embolism or aortic dissection.  The aorta is  of normal caliber. The heart is of normal size.  No pleural or pericardial  effusion.    Mildly prominent right hilar and to lesser extent left hilar lymph nodes are  observed. No other adenopathy.    Groundglass granularity is present throughout both lungs. No pulmonary  consolidation or mass.    DICOM format imaged data is available to non-affiliated external healthcare  facilities or entities on a secure, media free, reciprocally searchable basis  with patient authorization  for 12 months following the date of the study.                  Current Inpatient Meds and Allergies     Medications:  Current Facility-Administered Medications   Medication Dose Route Frequency   ??? albuterol-ipratropium (DUO-NEB) 2.5 MG-0.5 MG/3 ML  3 mL Nebulization Q6H RT   ??? aspirin delayed-release tablet 81 mg  81 mg Oral DAILY   ??? fluticasone-vilanterol (BREO ELLIPTA) 278mg-25mcg/puff  1 Puff Inhalation DAILY   ??? carvedilol (COREG) tablet 12.5 mg  12.5 mg Oral BID WITH MEALS   ??? diazePAM (VALIUM) tablet 5 mg  5 mg Oral Q8H PRN   ??? fish oil-omega-3 fatty acids 340-1,000 mg capsule 1 Cap  1 Cap Oral DAILY   ??? DULoxetine (CYMBALTA) capsule 60 mg  60 mg Oral DAILY   ??? furosemide (LASIX) tablet 20 mg  20 mg Oral DAILY   ??? levETIRAcetam (KEPPRA) tablet 1,000 mg  1,000 mg Oral BID   ??? losartan (COZAAR) tablet 100 mg  100  mg Oral QHS   ??? pantoprazole (PROTONIX) tablet 40 mg  40 mg Oral DAILY   ??? tiZANidine (ZANAFLEX) tablet 2 mg  2 mg Oral QHS PRN   ??? sodium chloride (NS) flush 5-10 mL  5-10 mL IntraVENous Q8H   ??? sodium chloride (NS) flush 5-10 mL  5-10 mL IntraVENous PRN   ??? naloxone (NARCAN) injection 0.1 mg  0.1 mg IntraVENous  PRN   ??? acetaminophen (TYLENOL) tablet 650 mg  650 mg Oral Q6H PRN   ??? enoxaparin (LOVENOX) injection 40 mg  40 mg SubCUTAneous Q24H   ??? azithromycin (ZITHROMAX) 500 mg in 0.9% sodium chloride 250 mL IVPB  500 mg IntraVENous Q24H   ??? nicotine (NICODERM CQ) 14 mg/24 hr patch 1 Patch  1 Patch TransDERmal DAILY   ??? methylPREDNISolone (PF) (SOLU-MEDROL) injection 40 mg  40 mg IntraVENous Q8H          Objective:       Visit Vitals  BP 136/78 (BP 1 Location: Right arm, BP Patient Position: Sitting)   Pulse (!) 101   Temp 97.3 ??F (36.3 ??C)   Resp 16   Ht 5' 4"  (1.626 m)   Wt 94.5 kg (208 lb 5.4 oz)   SpO2 93%   BMI 35.76 kg/m??     Body mass index is 35.76 kg/m??.    No intake or output data in the 24 hours ending 03/21/18 1115    General:   Alert, cooperative, no distress, appears stated age.    Lungs:     Decreased BS rhonchi,    Chest wall:   No tenderness or deformity.    Heart:   Regular rate and rhythm, S1, S2 normal, no murmur, click, rub or gallop.    Abdomen:    Soft, non-tender. Bowel sounds normal. No masses,  No organomegaly.        Musculoskeleta : Normal range of motion in most of the joints   Extremities:  Extremities normal, atraumatic, no cyanosis or edema.    Pulses:  2+ and symmetric all extremities.    Skin:  Skin color, texture, turgor normal. No rashes or lesions    Neurologic  Psych:  : CNII-XII intact.    Normal affect and mood . No thoughts of harm to self or others              Total time spent with chart review, patient examination/education, discussion with staff on case,documentation and medication management / adjustment:   >35 minutes    It is always a pleasure to be involved in the clinical care of this patient.    Insurance risk surveyor medical dictation software was used for portions of this report.  Unintended voice transcription errors may have occurred.)    Otila Kluver, MD  March 21, 2018  [facility] Internal Medicine  Pager:  [phone]

## 2018-03-21 NOTE — Progress Notes (Signed)
Problem: Falls - Risk of  Goal: *Absence of Falls  Description  Document Hannah [name] Fall Risk and appropriate interventions in the flowsheet.  Outcome: Progressing Towards Goal  Note: Fall Risk Interventions:            Medication Interventions: Patient to call before getting OOB(refused bed alarm on)                   Problem: Patient Education: Go to Patient Education Activity  Goal: Patient/Family Education  Outcome: Progressing Towards Goal     Problem: Breathing Pattern - Ineffective  Goal: *Absence of hypoxia  Outcome: Progressing Towards Goal  Goal: *Use of effective breathing techniques  Outcome: Progressing Towards Goal     Problem: Gas Exchange - Impaired  Goal: *Absence of hypoxia  Outcome: Progressing Towards Goal     Problem: Patient Education: Go to Patient Education Activity  Goal: Patient/Family Education  Outcome: Progressing Towards Goal

## 2018-03-22 LAB — BASIC METABOLIC PANEL
Anion Gap: 5 mmol/L (ref 5–15)
BUN: 20 mg/dl (ref 7–25)
CO2: 25 mEq/L (ref 21–32)
Calcium: 8.7 mg/dl (ref 8.5–10.1)
Chloride: 107 mEq/L (ref 98–107)
Creatinine: 0.9 mg/dl (ref 0.6–1.3)
EGFR IF NonAfrican American: >60
GFR African American: >60
Glucose: 149 mg/dl — ABNORMAL HIGH (ref 74–106)
Potassium: 4.4 mEq/L (ref 3.5–5.1)
Sodium: 137 mEq/L (ref 136–145)

## 2018-03-22 LAB — METABOLIC PANEL, BASIC
Anion gap: 5 mmol/L (ref 5–15)
BUN: 20 mg/dl (ref 7–25)
CO2: 25 mEq/L (ref 21–32)
Calcium: 8.7 mg/dl (ref 8.5–10.1)
Chloride: 107 mEq/L (ref 98–107)
Creatinine: 0.9 mg/dl (ref 0.6–1.3)
GFR est AA: >60
GFR est non-AA: >60
Glucose: 149 mg/dl — ABNORMAL HIGH (ref 74–106)
Potassium: 4.4 mEq/L (ref 3.5–5.1)
Sodium: 137 mEq/L (ref 136–145)

## 2018-03-22 MED ORDER — PREDNISONE 20 MG TAB
20 mg | Freq: Once | ORAL | Status: DC
Start: 2018-03-22 — End: 2018-03-23

## 2018-03-22 MED ORDER — METOPROLOL TARTRATE 25 MG TAB
25 mg | Freq: Two times a day (BID) | ORAL | Status: DC
Start: 2018-03-22 — End: 2018-03-23
  Administered 2018-03-22 – 2018-03-23 (×3): via ORAL

## 2018-03-22 MED ORDER — PREDNISONE 5 MG TAB
5 mg | Freq: Once | ORAL | Status: AC
Start: 2018-03-22 — End: 2018-03-23
  Administered 2018-03-23: 14:00:00 via ORAL

## 2018-03-22 MED ORDER — PREDNISONE 5 MG TAB
5 mg | Freq: Once | ORAL | Status: DC
Start: 2018-03-22 — End: 2018-03-23

## 2018-03-22 MED ORDER — ATORVASTATIN 10 MG TAB
10 mg | Freq: Every evening | ORAL | Status: DC
Start: 2018-03-22 — End: 2018-03-23
  Administered 2018-03-23: 04:00:00 via ORAL

## 2018-03-22 MED ORDER — PREDNISONE 20 MG TAB
20 mg | Freq: Once | ORAL | Status: AC
Start: 2018-03-22 — End: 2018-03-22
  Administered 2018-03-22: 18:00:00 via ORAL

## 2018-03-22 MED ORDER — PREDNISONE 10 MG TAB
10 mg | Freq: Once | ORAL | Status: DC
Start: 2018-03-22 — End: 2018-03-23

## 2018-03-22 MED FILL — IPRATROPIUM-ALBUTEROL 2.5 MG-0.5 MG/3 ML NEB SOLUTION: 2.5 mg-0.5 mg/3 ml | RESPIRATORY_TRACT | Qty: 3

## 2018-03-22 MED FILL — LEVETIRACETAM 500 MG TAB: 500 mg | ORAL | Qty: 2

## 2018-03-22 MED FILL — SOLU-MEDROL (PF) 40 MG/ML SOLUTION FOR INJECTION: 40 mg/mL | INTRAMUSCULAR | Qty: 1

## 2018-03-22 MED FILL — NICOTINE 14 MG/24 HR DAILY PATCH: 14 mg/24 hr | TRANSDERMAL | Qty: 1

## 2018-03-22 MED FILL — FUROSEMIDE 20 MG TAB: 20 mg | ORAL | Qty: 1

## 2018-03-22 MED FILL — LOSARTAN 50 MG TAB: 50 mg | ORAL | Qty: 2

## 2018-03-22 MED FILL — TIZANIDINE 2 MG TAB: 2 mg | ORAL | Qty: 1

## 2018-03-22 MED FILL — DIAZEPAM 5 MG TAB: 5 mg | ORAL | Qty: 1

## 2018-03-22 MED FILL — DULOXETINE 30 MG CAP, DELAYED RELEASE: 30 mg | ORAL | Qty: 2

## 2018-03-22 MED FILL — ENOXAPARIN 40 MG/0.4 ML SUB-Q SYRINGE: 40 mg/0.4 mL | SUBCUTANEOUS | Qty: 0.4

## 2018-03-22 MED FILL — CARVEDILOL 6.25 MG TAB: 6.25 mg | ORAL | Qty: 2

## 2018-03-22 MED FILL — FISH OIL 340 MG-1,000 MG CAPSULE: 340-1000 mg | ORAL | Qty: 1

## 2018-03-22 MED FILL — PANTOPRAZOLE 40 MG TAB, DELAYED RELEASE: 40 mg | ORAL | Qty: 1

## 2018-03-22 MED FILL — BD POSIFLUSH NORMAL SALINE 0.9 % INJECTION SYRINGE: INTRAMUSCULAR | Qty: 10

## 2018-03-22 MED FILL — ASPIRIN 81 MG TAB, DELAYED RELEASE: 81 mg | ORAL | Qty: 1

## 2018-03-22 MED FILL — AZITHROMYCIN 500 MG IV SOLUTION: 500 mg | INTRAVENOUS | Qty: 5

## 2018-03-22 MED FILL — PREDNISONE 20 MG TAB: 20 mg | ORAL | Qty: 2

## 2018-03-22 MED FILL — METOPROLOL TARTRATE 25 MG TAB: 25 mg | ORAL | Qty: 1

## 2018-03-22 NOTE — Progress Notes (Signed)
INTERNAL MEDICINE                                                                   Daily Progress Note    Patient:  Hannah [name]  Today date: March 22, 2018  Date of Admission:  03/20/2018    Interval History and Events of the last 24 hours:  Shortness of breath with improved, nonproductive cough, no fever    Assessment/Plan:       Patient seen in follow up for multiple medical problems as listed below :  Acute hypoxic respiratory failure second to COPD exacerbation  Continue nasal cannula oxygen maintaining O2 sat greater than 88%  Continue bronchodilators with albuterol and Atrovent  IV Solu-Medrol switch to a prednisone taper  Short of breath and cough improved    Acute bronchitis  Sputum culture if able to collection  Continue antibiotic with azithromycin    COPD exacerbation secondary to above  Continue treatment as above    Tobacco abuse  Smoking cessation discussed and encouraged. Risks of potentially life threatening consequences of continued smoking discussed. Patient is amenable to nicotine patch, but not yet willing to set a quit date.    Chronic diastolic congestive heart failure without evidence of acute exacerbation  Continue Coreg, aspirin, Lasix, losartan    GERD on a PPI at home    Hypertension  Continue Coreg and losartan  Monitor pressure closely    Anxiety  Continue home anxiety and pain regimen including Valium as needed,     Seizure disorder  Continue home Keppra 1000 twice daily for seizure disorder    Chronic pain on Cymbalta and muscle relaxant    ??  DVT Prophylaxis:    Code Status:  .Full Code     Disposition and Family:   Recommend to continue hospitalization. Discussed with patient and nursing staff.    Anticipated Date of Discharge: TBD  Anticipated Disposition (home, SNF) : TBD    ROS     As H&P and Above  Labwork and Ancillary Studies     All labs/tests/imaging reviewed.Spoke with the nurse regarding patient issues.     Labwork:    CBC w/Diff   Lab Results   Component Value Date/Time    WBC 14.6 (H) 03/21/2018 07:16 AM    HGB 13.3 03/21/2018 07:16 AM    HCT 40.9 03/21/2018 07:16 AM    PLATELET 209 03/21/2018 07:16 AM    MCV 96.7 03/21/2018 07:16 AM        Basic Metabolic Profile   Lab Results   Component Value Date/Time    Sodium 137 03/22/2018 04:01 AM    Potassium 4.4 03/22/2018 04:01 AM    Chloride 107 03/22/2018 04:01 AM    CO2 25 03/22/2018 04:01 AM    Anion gap 5 03/22/2018 04:01 AM    Glucose 149 (H) 03/22/2018 04:01 AM    BUN 20 03/22/2018 04:01 AM    Creatinine 0.9 03/22/2018 04:01 AM    GFR est AA >60.0 03/22/2018 04:01 AM    GFR est non-AA >60 03/22/2018 04:01 AM    Calcium 8.7 03/22/2018 04:01 AM        Cardiac Enzymes   Lab Results   Component Value Date/Time    CK  206 (H) 11/26/2016 01:59 PM    CK-MB Index 0.6 04/22/2016 02:14 PM    Troponin-I <0.015 03/20/2018 11:30 PM      Arterial Blood Gases   No results found for: PH, PHI, PCO2, PCO2I, PO2, PO2I, HCO3, HCO3I, FIO2, FIO2I   Coagulation    No results found for: APTT, PTP, INR, INREXT, INREXT   Hepatic Function   Lab Results   Component Value Date/Time    AST (SGOT) 18 03/21/2018 07:16 AM    Alk. phosphatase 88 03/21/2018 07:16 AM            XR Results (most recent):  Results from [place] encounter on 03/20/18   XR CHEST SNGL V    Narrative Indication: sob.    .      Impression IMPRESSION: Portable AP upright view the chest exposed at 11:00 AM March 20, 2018 reveals the lungs are clear. The heart is of normal size. Lower anterior  cervical fusion hardware is in place.Marland Kitchen              MRI Results (most recent):  No results found for this or any previous visit.    CT Results (most recent):  Results from Hospital Encounter encounter on 03/20/18   CTA CHEST W OR W WO CONT    Narrative History: sob.  History of hypercoagulable state..          Impression Impression: No aortic dissection or pulmonary embolism.          Comment: 3D CTA images of the chest were performed with intravenous contrast.  Multiplanar PE protocol was utilized and MIPS projections were obtained.  This  was compared with low-dose CT of October 28, 2017 and October 29, 2016.    There is no evidence of pulmonary embolism or aortic dissection.  The aorta is  of normal caliber. The heart is of normal size.  No pleural or pericardial  effusion.    Mildly prominent right hilar and to lesser extent left hilar lymph nodes are  observed. No other adenopathy.    Groundglass granularity is present throughout both lungs. No pulmonary  consolidation or mass.    DICOM format imaged data is available to non-affiliated external healthcare  facilities or entities on a secure, media free, reciprocally searchable basis  with patient authorization  for 12 months following the date of the study.                  Current Inpatient Meds and Allergies     Medications:  Current Facility-Administered Medications   Medication Dose Route Frequency   ??? predniSONE (DELTASONE) tablet 40 mg  40 mg Oral ONCE    Followed by   ??? [START ON 03/23/2018] predniSONE (DELTASONE) tablet 35 mg  35 mg Oral ONCE    Followed by   ??? [START ON 03/24/2018] predniSONE (DELTASONE) tablet 30 mg  30 mg Oral ONCE    Followed by   ??? [START ON 03/25/2018] predniSONE (DELTASONE) tablet 25 mg  25 mg Oral ONCE    Followed by   ??? [START ON 03/26/2018] predniSONE (DELTASONE) tablet 20 mg  20 mg Oral ONCE    Followed by   ??? [START ON 03/27/2018] predniSONE (DELTASONE) tablet 15 mg  15 mg Oral ONCE    Followed by   ??? [START ON 03/28/2018] predniSONE (DELTASONE) tablet 10 mg  10 mg Oral ONCE    Followed by   ??? [START ON 03/29/2018] predniSONE (DELTASONE) tablet 5  mg  5 mg Oral ONCE   ??? albuterol-ipratropium (DUO-NEB) 2.5 MG-0.5 MG/3 ML  3 mL Nebulization Q6H RT   ??? aspirin delayed-release tablet 81 mg  81 mg Oral DAILY   ??? fluticasone-vilanterol (BREO ELLIPTA) 227mg-25mcg/puff  1 Puff Inhalation DAILY    ??? carvedilol (COREG) tablet 12.5 mg  12.5 mg Oral BID WITH MEALS   ??? diazePAM (VALIUM) tablet 5 mg  5 mg Oral Q8H PRN   ??? fish oil-omega-3 fatty acids 340-1,000 mg capsule 1 Cap  1 Cap Oral DAILY   ??? DULoxetine (CYMBALTA) capsule 60 mg  60 mg Oral DAILY   ??? furosemide (LASIX) tablet 20 mg  20 mg Oral DAILY   ??? levETIRAcetam (KEPPRA) tablet 1,000 mg  1,000 mg Oral BID   ??? losartan (COZAAR) tablet 100 mg  100 mg Oral QHS   ??? pantoprazole (PROTONIX) tablet 40 mg  40 mg Oral DAILY   ??? tiZANidine (ZANAFLEX) tablet 2 mg  2 mg Oral QHS PRN   ??? sodium chloride (NS) flush 5-10 mL  5-10 mL IntraVENous Q8H   ??? sodium chloride (NS) flush 5-10 mL  5-10 mL IntraVENous PRN   ??? naloxone (NARCAN) injection 0.1 mg  0.1 mg IntraVENous PRN   ??? acetaminophen (TYLENOL) tablet 650 mg  650 mg Oral Q6H PRN   ??? enoxaparin (LOVENOX) injection 40 mg  40 mg SubCUTAneous Q24H   ??? azithromycin (ZITHROMAX) 500 mg in 0.9% sodium chloride 250 mL IVPB  500 mg IntraVENous Q24H   ??? nicotine (NICODERM CQ) 14 mg/24 hr patch 1 Patch  1 Patch TransDERmal DAILY          Objective:       Visit Vitals  BP 129/76 (BP 1 Location: Left arm, BP Patient Position: Supine)   Pulse 84   Temp 97.7 ??F (36.5 ??C)   Resp 12   Ht 5' 4"  (1.626 m)   Wt 96.6 kg (212 lb 15.4 oz)   SpO2 94%   BMI 36.56 kg/m??     Body mass index is 36.56 kg/m??.      Intake/Output Summary (Last 24 hours) at 03/22/2018 1124  Last data filed at 03/21/2018 2348  Gross per 24 hour   Intake 740 ml   Output ???   Net 740 ml       General:   Alert, cooperative, no distress, appears stated age.    Lungs:     Decreased BS rhonchi,    Chest wall:   No tenderness or deformity.    Heart:   Regular rate and rhythm, S1, S2 normal, no murmur, click, rub or gallop.    Abdomen:    Soft, non-tender. Bowel sounds normal. No masses,  No organomegaly.        Musculoskeleta : Normal range of motion in most of the joints   Extremities:  Extremities normal, atraumatic, no cyanosis or edema.     Pulses:  2+ and symmetric all extremities.    Skin:  Skin color, texture, turgor normal. No rashes or lesions    Neurologic  Psych:  : CNII-XII intact.    Normal affect and mood . No thoughts of harm to self or others              Total time spent with chart review, patient examination/education, discussion with staff on case,documentation and medication management / adjustment:   >35 minutes    It is always a pleasure to be involved in the clinical care of  this patient.    Insurance risk surveyor medical dictation software was used for portions of this report.  Unintended voice transcription errors may have occurred.)    Otila Kluver, MD  March 22, 2018  [facility] Internal Medicine  Pager:  [phone]

## 2018-03-22 NOTE — Other (Addendum)
Bedside shift change report given to Jonelle Sidle, RN (oncoming nurse) by Angie Fava, RN (offgoing nurse). Report included the following information SBAR, Kardex, MAR, Recent Results and Cardiac Rhythm NSR.

## 2018-03-22 NOTE — Progress Notes (Signed)
Problem: Falls - Risk of  Goal: *Absence of Falls  Description  Document Patrcia Dolly Fall Risk and appropriate interventions in the flowsheet.  Outcome: Progressing Towards Goal  Note: Fall Risk Interventions:            Medication Interventions: Evaluate medications/consider consulting pharmacy                   Problem: Patient Education: Go to Patient Education Activity  Goal: Patient/Family Education  Outcome: Progressing Towards Goal     Problem: Breathing Pattern - Ineffective  Goal: *Absence of hypoxia  Outcome: Progressing Towards Goal  Goal: *Use of effective breathing techniques  Outcome: Progressing Towards Goal     Problem: Gas Exchange - Impaired  Goal: *Absence of hypoxia  Outcome: Progressing Towards Goal     Problem: Patient Education: Go to Patient Education Activity  Goal: Patient/Family Education  Outcome: Progressing Towards Goal     Problem: Gas Exchange - Impaired  Goal: *Absence of hypoxia  Outcome: Progressing Towards Goal

## 2018-03-22 NOTE — Progress Notes (Signed)
Talked with the patient regarding COPD causes, exacerbations and disease process.  The patient participated in a return demo of breathing techniques and received smoking cessation counseling to include the QUIT-NOW program.  Time spent on smoking cessation counseling with the patient was greater then 10 minutes.  The patient was given a COPD clinic packet of literature regarding COPD, TIGR video system, smoking cessation and the COPD action plan.  The contents of the packet was reviewed and discussed with the patient.        SaO2  =  94% on Room Air  BS      =  Diminished    FeV1  =        FVC   =        Patient did not feel up to doing [employer] at this time (family brought her breakfast)  FeV1/ FVC ratio  =               COPD Assessment Test Score:  27      Patient currently smokes about 1/2 pack per day for about 44 years.   Patient had quit approximately 4 years ago.  When back to smoking about 1 year ago after husband passing.

## 2018-03-22 NOTE — Progress Notes (Signed)
Problem: Falls - Risk of  Goal: *Absence of Falls  Description  Document Hannah [name] Fall Risk and appropriate interventions in the flowsheet.  Outcome: Progressing Towards Goal  Note: Fall Risk Interventions:            Medication Interventions: Evaluate medications/consider consulting pharmacy                   Problem: Patient Education: Go to Patient Education Activity  Goal: Patient/Family Education  Outcome: Progressing Towards Goal     Problem: Breathing Pattern - Ineffective  Goal: *Absence of hypoxia  Outcome: Progressing Towards Goal  Goal: *Use of effective breathing techniques  Outcome: Progressing Towards Goal     Problem: Gas Exchange - Impaired  Goal: *Absence of hypoxia  Outcome: Progressing Towards Goal     Problem: Patient Education: Go to Patient Education Activity  Goal: Patient/Family Education  Outcome: Progressing Towards Goal     Problem: Gas Exchange - Impaired  Goal: *Absence of hypoxia  Outcome: Progressing Towards Goal

## 2018-03-22 NOTE — Progress Notes (Signed)
INTERNAL MEDICINE                                                                   Daily Progress Note    Patient:  Hannah [name]  Today date: March 22, 2018  Date of Admission:  03/20/2018    Interval History and Events of the last 24 hours:  Shortness of breath with improved, nonproductive cough, no fever    Assessment/Plan:       Patient seen in follow up for multiple medical problems as listed below :  Acute hypoxic respiratory failure second to COPD exacerbation  Continue nasal cannula oxygen maintaining O2 sat greater than 88%  Continue bronchodilators with albuterol and Atrovent  IV Solu-Medrol switch to a prednisone taper  Short of breath and cough improved    Acute bronchitis  Sputum culture if able to collection  Continue antibiotic with azithromycin    COPD exacerbation secondary to above  Continue treatment as above    Tobacco abuse  Smoking cessation discussed and encouraged. Risks of potentially life threatening consequences of continued smoking discussed. Patient is amenable to nicotine patch, but not yet willing to set a quit date.    Chronic diastolic congestive heart failure without evidence of acute exacerbation  Continue Coreg, aspirin, Lasix, losartan    GERD on a PPI at home    Hypertension  Continue Coreg and losartan  Monitor pressure closely    Anxiety  Continue home anxiety and pain regimen including Valium as needed,     Seizure disorder  Continue home Keppra 1000 twice daily for seizure disorder    Chronic pain on Cymbalta and muscle relaxant    ??  DVT Prophylaxis:    Code Status:  .Full Code     Disposition and Family:   Recommend to continue hospitalization. Discussed with patient and nursing staff.    Anticipated Date of Discharge: TBD  Anticipated Disposition (home, SNF) : TBD    ROS     As H&P and Above  Labwork and Ancillary Studies     All labs/tests/imaging reviewed.Spoke with the nurse regarding patient  issues.    Labwork:    CBC w/Diff   Lab Results   Component Value Date/Time    WBC 14.6 (H) 03/21/2018 07:16 AM    HGB 13.3 03/21/2018 07:16 AM    HCT 40.9 03/21/2018 07:16 AM    PLATELET 209 03/21/2018 07:16 AM    MCV 96.7 03/21/2018 07:16 AM        Basic Metabolic Profile   Lab Results   Component Value Date/Time    Sodium 137 03/22/2018 04:01 AM    Potassium 4.4 03/22/2018 04:01 AM    Chloride 107 03/22/2018 04:01 AM    CO2 25 03/22/2018 04:01 AM    Anion gap 5 03/22/2018 04:01 AM    Glucose 149 (H) 03/22/2018 04:01 AM    BUN 20 03/22/2018 04:01 AM    Creatinine 0.9 03/22/2018 04:01 AM    GFR est AA >60.0 03/22/2018 04:01 AM    GFR est non-AA >60 03/22/2018 04:01 AM    Calcium 8.7 03/22/2018 04:01 AM        Cardiac Enzymes   Lab Results   Component Value Date/Time    CK  206 (H) 11/26/2016 01:59 PM    CK-MB Index 0.6 04/22/2016 02:14 PM    Troponin-I <0.015 03/20/2018 11:30 PM      Arterial Blood Gases   No results found for: PH, PHI, PCO2, PCO2I, PO2, PO2I, HCO3, HCO3I, FIO2, FIO2I   Coagulation    No results found for: APTT, PTP, INR, INREXT, INREXT   Hepatic Function   Lab Results   Component Value Date/Time    AST (SGOT) 18 03/21/2018 07:16 AM    Alk. phosphatase 88 03/21/2018 07:16 AM            XR Results (most recent):  Results from [place] encounter on 03/20/18   XR CHEST SNGL V    Narrative Indication: sob.    .      Impression IMPRESSION: Portable AP upright view the chest exposed at 11:00 AM March 20, 2018 reveals the lungs are clear. The heart is of normal size. Lower anterior  cervical fusion hardware is in place.Marland Kitchen              MRI Results (most recent):  No results found for this or any previous visit.    CT Results (most recent):  Results from Hospital Encounter encounter on 03/20/18   CTA CHEST W OR W WO CONT    Narrative History: sob.  History of hypercoagulable state..          Impression Impression: No aortic dissection or pulmonary embolism.         Comment: 3D CTA images of the  chest were performed with intravenous contrast.  Multiplanar PE protocol was utilized and MIPS projections were obtained.  This  was compared with low-dose CT of October 28, 2017 and October 29, 2016.    There is no evidence of pulmonary embolism or aortic dissection.  The aorta is  of normal caliber. The heart is of normal size.  No pleural or pericardial  effusion.    Mildly prominent right hilar and to lesser extent left hilar lymph nodes are  observed. No other adenopathy.    Groundglass granularity is present throughout both lungs. No pulmonary  consolidation or mass.    DICOM format imaged data is available to non-affiliated external healthcare  facilities or entities on a secure, media free, reciprocally searchable basis  with patient authorization  for 12 months following the date of the study.                  Current Inpatient Meds and Allergies     Medications:  Current Facility-Administered Medications   Medication Dose Route Frequency   ??? predniSONE (DELTASONE) tablet 40 mg  40 mg Oral ONCE    Followed by   ??? [START ON 03/23/2018] predniSONE (DELTASONE) tablet 35 mg  35 mg Oral ONCE    Followed by   ??? [START ON 03/24/2018] predniSONE (DELTASONE) tablet 30 mg  30 mg Oral ONCE    Followed by   ??? [START ON 03/25/2018] predniSONE (DELTASONE) tablet 25 mg  25 mg Oral ONCE    Followed by   ??? [START ON 03/26/2018] predniSONE (DELTASONE) tablet 20 mg  20 mg Oral ONCE    Followed by   ??? [START ON 03/27/2018] predniSONE (DELTASONE) tablet 15 mg  15 mg Oral ONCE    Followed by   ??? [START ON 03/28/2018] predniSONE (DELTASONE) tablet 10 mg  10 mg Oral ONCE    Followed by   ??? [START ON 03/29/2018] predniSONE (DELTASONE) tablet 5  mg  5 mg Oral ONCE   ??? albuterol-ipratropium (DUO-NEB) 2.5 MG-0.5 MG/3 ML  3 mL Nebulization Q6H RT   ??? aspirin delayed-release tablet 81 mg  81 mg Oral DAILY   ??? fluticasone-vilanterol (BREO ELLIPTA) 268mg-25mcg/puff  1 Puff Inhalation DAILY   ??? carvedilol (COREG) tablet 12.5 mg  12.5 mg Oral BID WITH  MEALS   ??? diazePAM (VALIUM) tablet 5 mg  5 mg Oral Q8H PRN   ??? fish oil-omega-3 fatty acids 340-1,000 mg capsule 1 Cap  1 Cap Oral DAILY   ??? DULoxetine (CYMBALTA) capsule 60 mg  60 mg Oral DAILY   ??? furosemide (LASIX) tablet 20 mg  20 mg Oral DAILY   ??? levETIRAcetam (KEPPRA) tablet 1,000 mg  1,000 mg Oral BID   ??? losartan (COZAAR) tablet 100 mg  100 mg Oral QHS   ??? pantoprazole (PROTONIX) tablet 40 mg  40 mg Oral DAILY   ??? tiZANidine (ZANAFLEX) tablet 2 mg  2 mg Oral QHS PRN   ??? sodium chloride (NS) flush 5-10 mL  5-10 mL IntraVENous Q8H   ??? sodium chloride (NS) flush 5-10 mL  5-10 mL IntraVENous PRN   ??? naloxone (NARCAN) injection 0.1 mg  0.1 mg IntraVENous PRN   ??? acetaminophen (TYLENOL) tablet 650 mg  650 mg Oral Q6H PRN   ??? enoxaparin (LOVENOX) injection 40 mg  40 mg SubCUTAneous Q24H   ??? azithromycin (ZITHROMAX) 500 mg in 0.9% sodium chloride 250 mL IVPB  500 mg IntraVENous Q24H   ??? nicotine (NICODERM CQ) 14 mg/24 hr patch 1 Patch  1 Patch TransDERmal DAILY          Objective:       Visit Vitals  BP 129/76 (BP 1 Location: Left arm, BP Patient Position: Supine)   Pulse 84   Temp 97.7 ??F (36.5 ??C)   Resp 12   Ht 5' 4"  (1.626 m)   Wt 96.6 kg (212 lb 15.4 oz)   SpO2 94%   BMI 36.56 kg/m??     Body mass index is 36.56 kg/m??.      Intake/Output Summary (Last 24 hours) at 03/22/2018 1124  Last data filed at 03/21/2018 2348  Gross per 24 hour   Intake 740 ml   Output ???   Net 740 ml       General:   Alert, cooperative, no distress, appears stated age.    Lungs:     Decreased BS rhonchi,    Chest wall:   No tenderness or deformity.    Heart:   Regular rate and rhythm, S1, S2 normal, no murmur, click, rub or gallop.    Abdomen:    Soft, non-tender. Bowel sounds normal. No masses,  No organomegaly.        Musculoskeleta : Normal range of motion in most of the joints   Extremities:  Extremities normal, atraumatic, no cyanosis or edema.    Pulses:  2+ and symmetric all extremities.    Skin:  Skin color, texture, turgor normal. No  rashes or lesions    Neurologic  Psych:  : CNII-XII intact.    Normal affect and mood . No thoughts of harm to self or others              Total time spent with chart review, patient examination/education, discussion with staff on case,documentation and medication management / adjustment:   >35 minutes    It is always a pleasure to be involved in the clinical care of  this patient.    Insurance risk surveyor medical dictation software was used for portions of this report.  Unintended voice transcription errors may have occurred.)    Otila Kluver, MD  March 22, 2018  [facility] Internal Medicine  Pager:  [phone]

## 2018-03-23 LAB — CULTURE, RESPIRATORY/SPUTUM/BRONCH W GRAM STAIN
GRAM STAIN: <10
Gram Stain Result: <10

## 2018-03-23 MED ORDER — IPRATROPIUM-ALBUTEROL 2.5 MG-0.5 MG/3 ML NEB SOLUTION
2.5 mg-0.5 mg/3 ml | INHALATION_SOLUTION | Freq: Four times a day (QID) | RESPIRATORY_TRACT | 0 refills | Status: AC | PRN
Start: 2018-03-23 — End: ?

## 2018-03-23 MED ORDER — METOPROLOL TARTRATE 25 MG TAB
25 mg | ORAL_TABLET | Freq: Two times a day (BID) | ORAL | 0 refills | Status: AC
Start: 2018-03-23 — End: ?

## 2018-03-23 MED ORDER — ATORVASTATIN 20 MG TAB
20 mg | ORAL_TABLET | Freq: Every evening | ORAL | 0 refills | Status: AC
Start: 2018-03-23 — End: ?

## 2018-03-23 MED ORDER — PREDNISONE 5 MG TAB
5 mg | ORAL_TABLET | Freq: Every day | ORAL | 0 refills | Status: AC
Start: 2018-03-23 — End: ?

## 2018-03-23 MED ORDER — AZITHROMYCIN 250 MG TAB
250 mg | ORAL_TABLET | ORAL | 0 refills | Status: AC
Start: 2018-03-23 — End: 2018-03-28

## 2018-03-23 MED FILL — METOPROLOL TARTRATE 25 MG TAB: 25 mg | ORAL | Qty: 1

## 2018-03-23 MED FILL — LEVETIRACETAM 500 MG TAB: 500 mg | ORAL | Qty: 2

## 2018-03-23 MED FILL — BD POSIFLUSH NORMAL SALINE 0.9 % INJECTION SYRINGE: INTRAMUSCULAR | Qty: 10

## 2018-03-23 MED FILL — ATORVASTATIN 10 MG TAB: 10 mg | ORAL | Qty: 2

## 2018-03-23 MED FILL — DULOXETINE 30 MG CAP, DELAYED RELEASE: 30 mg | ORAL | Qty: 2

## 2018-03-23 MED FILL — IPRATROPIUM-ALBUTEROL 2.5 MG-0.5 MG/3 ML NEB SOLUTION: 2.5 mg-0.5 mg/3 ml | RESPIRATORY_TRACT | Qty: 3

## 2018-03-23 MED FILL — NICOTINE 14 MG/24 HR DAILY PATCH: 14 mg/24 hr | TRANSDERMAL | Qty: 1

## 2018-03-23 MED FILL — PREDNISONE 5 MG TAB: 5 mg | ORAL | Qty: 1

## 2018-03-23 MED FILL — PANTOPRAZOLE 40 MG TAB, DELAYED RELEASE: 40 mg | ORAL | Qty: 1

## 2018-03-23 MED FILL — TIZANIDINE 2 MG TAB: 2 mg | ORAL | Qty: 1

## 2018-03-23 MED FILL — ASPIRIN 81 MG TAB, DELAYED RELEASE: 81 mg | ORAL | Qty: 1

## 2018-03-23 MED FILL — AZITHROMYCIN 500 MG IV SOLUTION: 500 mg | INTRAVENOUS | Qty: 5

## 2018-03-23 MED FILL — FISH OIL 340 MG-1,000 MG CAPSULE: 340-1000 mg | ORAL | Qty: 1

## 2018-03-23 MED FILL — LOSARTAN 50 MG TAB: 50 mg | ORAL | Qty: 2

## 2018-03-23 MED FILL — FUROSEMIDE 20 MG TAB: 20 mg | ORAL | Qty: 1

## 2018-03-23 MED FILL — DIAZEPAM 5 MG TAB: 5 mg | ORAL | Qty: 1

## 2018-03-23 NOTE — Progress Notes (Signed)
Patient admitted on 03/20/2018 from home with   Chief Complaint   Patient presents with   ??? Shortness of Breath   ??? Palpitations          The patient is being treated for    PMH:   Past Medical History:   Diagnosis Date   ??? Anginal pain ([place])    ??? Anxiety and depression    ??? CHF (congestive heart failure) (HCC)     muscle   ??? Emphysema/COPD ([place])    ??? Fibromyalgia    ??? GERD (gastroesophageal reflux disease)    ??? Hiatal hernia    ??? Hypercoagulable state ([place])    ??? Hyperlipidemia    ??? Hypertension    ??? Nicotine vapor product user    ??? Seizure ([place])     last seizure 12/2013-grand mal   ??? Tachycardia         Treatment Team: Treatment Team: Attending Provider: Otila Kluver, MD; Consulting Provider: Jonnie Kind, DO      The patient has been admitted to the hospital 0 times in the past 12 months.    Previous 4 Admission Dates Admission and Discharge Diagnosis Interventions Barriers Disposition                                 Patient and Family/Caregivers Goals of Care:  Caregivers Participating in Plan of Care/Discharge Plan with the patient:    Barriers to Healthcare Success/ Readmission Risk Factors:    Transitional Care Clinic Referral:  Transitional Nurse Navigator Referral:  Oncology Navigator Referral:    Food/Nutrition Needs: Dietician Consulted n SW Consulted n    Palliative Care Consult Recommended:   RRAT Score: Low Risk            11       Total Score        3 Has Seen PCP in Last 6 Months (Yes=3, No=0)    5 Pt. Coverage (Medicare=5 , Medicaid, or Self-Pay=4)    3 Charlson Comorbidity Score (Age + Comorbid Conditions)        Criteria that do not apply:    Married. Living with Significant Other. Assisted Living. LTAC. SNF. or   Rehab    Patient Length of Stay (>5 days = 3)    IP Visits Last 12 Months (1-3=4, 4=9, >4=11)         Tentative dc plan:     Anticipated DME needs for discharge:    The patient and care participants are willing to travel 10 m area for discharge facility.   The patient and plan of care participants have been provided with a list of all available Rehab Facilities or [place] agencies as applicable. CM will follow up with a list of facilities or agencies that are offer acceptance.    CM has disclosed any financial interest that [facility] may have with any facility or agency.    Anticipated Discharge Date:    The plan of care and discharge plan has been discussed with Otila Kluver, MD and all other appropriate providers and adjusted per interdisciplinary team recommendations and in discussion with the patient and the patient designated Care Plan Participants.      PCP: Christa See, MD . How do you get to your doctor appointments?    Specialists:     Dialysis Unit:    [facility]. Are there any medications that you have trouble paying for?  Any difficulty getting your medications?    DME available at Home:    Home O2 L Flow:              Home O2 Provider:    Home Environment and Prior Level of Function: Lives at [place] [address] @HOMEPHONE @. Lives with family.  Multistory or 1 story.  Steps into home.  Responsibilities at home include  independent    Prior to admission open services:    [place]-    Extended Emergency Contact Information  Primary Emergency Contact: [place] Phone: [phone]  Mobile Phone: [phone]  Relation: Son     Transportation: Family will transport home    Therapy Recommendations:    OT =     PT =     SLP =      RT Home O2 Evaluation =      Wound Care =      [facility] Consulted:    Outside Hospital/Community Resources Referrals and Collaboration:    Case Management Assessment    ABUSE/NEGLECT SCREENING   Physical Abuse/Neglect: Denies   Sexual Abuse: Denies   Sexual Abuse: Denies   Other Abuse/Issues: Denies          PRIMARY DECISION MAKER                                    CARE MANAGEMENT INTERVENTIONS   Readmission Interview Completed: Not Applicable   PCP Verified by CM: Yes                   Transition of Care Consult (CM Consult): Discharge Planning                               Current Support Network: Own Home   Reason for Referral: DCP Rounds   History Provided By: Patient   Patient Orientation: Alert and Oriented, Person, Place, Situation, Self   Cognition: Alert   Support System Response: Unavailable   Previous Living Arrangement: Lives with Family Independent   Home Accessibility: Steps   Prior Functional Level: Independent in ADLs/IADLs   Current Functional Level: Independent in ADLs/IADLs       Can patient return to prior living arrangement: Yes   Ability to make needs known:: Good   Family able to assist with home care needs:: Yes                       Confirm Follow Up Transport: Family                  DISCHARGE LOCATION   Discharge Placement: Home

## 2018-03-23 NOTE — Discharge Summary (Signed)
Discharge Summary       Patient: Hannah [name] Age: 63 y.o. DOB: 27-Oct-1955 MR#: [id number] SSN: [id number]  PCP on record: Christa See, MD  Admit date: 03/20/2018  Discharge date: 03/23/2018    Admission Diagnoses:   COPD with acute exacerbation ([place]) [J44.1]  Hypoxia [R09.02]  COPD exacerbation ([place]) [J44.1]  -    Discharge Diagnoses:   Patient seen in follow up for multiple medical problems as listed below :  Acute hypoxic respiratory failure second to COPD exacerbation  Continue nasal cannula oxygen maintaining O2 sat greater than 88%  Continue bronchodilators with albuterol and Atrovent  IV Solu-Medrol switch to a prednisone taper  Short of breath and cough improved  ??  Acute bronchitis  Sputum culture if able to collection  Continue antibiotic with azithromycin  ??  COPD exacerbation secondary to above  Continue treatment as above  ??  Tobacco abuse  Smoking cessation discussed and encouraged. Risks of potentially life threatening consequences of continued smoking discussed. Patient is amenable to nicotine patch, but not yet willing to set a quit date.  ??  Chronic diastolic congestive heart failure without evidence of acute exacerbation  Continue Coreg, aspirin, Lasix, losartan  ??  GERD on a PPI at home  ??  Hypertension  Continue Coreg and losartan  Monitor pressure closely  ??  Anxiety  Continue home anxiety and pain regimen including Valium as needed,   ??  Seizure disorder  Continue home Keppra 1000 twice daily for seizure disorder  ??  Chronic pain on Cymbalta and muscle relaxant    Medical History   Hannah [name] is a 63 y.o. year old female medical history of chronic diastolic congestive heart failure, COPD, tobacco abuse who continues to smoke, GERD, hypertension, anxiety and seizure disorder presents from home today accompanied by her sister with whom she lives in her son.  Patient complains of approximately 1 week of gradually worsening shortness of  breath and chest tightness.  She states her symptoms began as nasal congestion for about 3 days a week ago associated with fever and chills at that time, she took over-the-counter Sudafed and it caused her to have significant palpitations.  She states since then her shortness of breath and palpitations have worsened, she has a cough with productive clear mucus, no recent fever or chills.  Denies any leg swelling or orthopnea symptoms.  When asked how many times she takes her albuterol she states "all the time ", estimates using her albuterol least 6-7 times today prior to arrival to the emergency room without relief of her chest tightness and wheezing.      Hospital Course by Problem   Patient  Patient received duo nebs x2 Solu-Medrol 125 with subjective improvement in chest tightness.  Chest x-ray without infiltrate.  Patient reported having a "prothrombotic state "denies being told of any DVT or PE however, unclear etiology for this diagnosis therefore CTA was done which did not reveal aortic dissection or PE but was consistent with prominent right hilar lymphadenopathy as well as left hilar lymphadenopathy and groundglass appearance in the bilateral lungs.  Patient was hypoxic down to 87% room air, was started on 3 L nasal cannula with improvement to 9394%.  No prior oxygen requirement    Acute hypoxic respiratory failure second to COPD exacerbation  Continue nasal cannula oxygen maintaining O2 sat greater than 88%  Continue bronchodilators with albuterol and Atrovent  IV Solu-Medrol switch to a prednisone taper  Short of breath and  cough improved  ??  Acute bronchitis  Sputum culture if unable to collection  Continue antibiotic with azithromycin  ??  COPD exacerbation secondary to above  Continue treatment as above  ??  Tobacco abuse  Smoking cessation discussed and encouraged. Risks of potentially life threatening consequences of continued smoking discussed. Patient is  amenable to nicotine patch, but not yet willing to set a quit date.  ??  Chronic diastolic congestive heart failure without evidence of acute exacerbation  Continue Lopressor  aspirin, Lasix, losartan  ??  GERD on a PPI at home  ??  Hypertension  Continue Lopressor  and losartan  Monitor pressure closely  ??  Anxiety  Continue home anxiety and pain regimen including Valium as needed,   ??  Seizure disorder  Continue home Keppra 1000 twice daily for seizure disorder  ??  Chronic pain on Cymbalta and muscle relaxant          Today's examination of the patient revealed:     Objective:   VS:   Visit Vitals  BP 124/46 (BP Patient Position: Supine)   Pulse 74   Temp 97.5 ??F (36.4 ??C)   Resp 18   Ht 5\' 4"  (1.626 m)   Wt 99.6 kg (219 lb 9.3 oz)   SpO2 94%   BMI 37.69 kg/m??      Tmax/24hrs: Temp (24hrs), Avg:97.9 ??F (36.6 ??C), Min:97.3 ??F (36.3 ??C), Max:98.6 ??F (37 ??C)     Input/Output:     Intake/Output Summary (Last 24 hours) at 03/23/2018 1421  Last data filed at 03/22/2018 2359  Gross per 24 hour   Intake 360 ml   Output ???   Net 360 ml       General appearance: no distress  Eyes: sclera anicteric  ENT: no oral lesions, thyroid normal  Skin: no spider angiomata, jaundice,   Respiratory: clear to auscultation bilaterally  Cardiovascular: regular heart rate, no murmurs, no JVD  Abdomen: soft, non-tender, no rebound  Extremities: no muscle wasting, no gross arthritic changes  GU: not examined  Neurologic: alert and oriented, cranial nerves grossly intact,         Labs:    No results found for this or any previous visit (from the past 24 hour(s)).  Additional Data Reviewed:      XR Results (most recent):  Results from Hospital Encounter encounter on 03/20/18   XR CHEST SNGL V    Narrative Indication: sob.    .      Impression IMPRESSION: Portable AP upright view the chest exposed at 11:00 AM March 20, 2018 reveals the lungs are clear. The heart is of normal size. Lower anterior  cervical fusion hardware is in place.Marland Kitchen               MRI Results (most recent):  No results found for this or any previous visit.    CT Results (most recent):  Results from Hospital Encounter encounter on 03/20/18   CTA CHEST W OR W WO CONT    Narrative History: sob.  History of hypercoagulable state..          Impression Impression: No aortic dissection or pulmonary embolism.         Comment: 3D CTA images of the chest were performed with intravenous contrast.  Multiplanar PE protocol was utilized and MIPS projections were obtained.  This  was compared with low-dose CT of October 28, 2017 and October 29, 2016.    There is no evidence of  pulmonary embolism or aortic dissection.  The aorta is  of normal caliber. The heart is of normal size.  No pleural or pericardial  effusion.    Mildly prominent right hilar and to lesser extent left hilar lymph nodes are  observed. No other adenopathy.    Groundglass granularity is present throughout both lungs. No pulmonary  consolidation or mass.    DICOM format imaged data is available to non-affiliated external healthcare  facilities or entities on a secure, media free, reciprocally searchable basis  with patient authorization  for 12 months following the date of the study.                     Condition:       Diet:     Disposition:    [] Home   [] Home with Home Health   [] SNF/NH   [] Rehab   [] Home with family   [] Alternate Facility:____________________      Discharge Medications:        My Medications      START taking these medications      Instructions Each Dose to Equal Morning Noon Evening Bedtime   albuterol-ipratropium 2.5 mg-0.5 mg/3 ml Nebu  Commonly known as:  DUO-NEB    Your last dose was:      Your next dose is:          3 mL by Nebulization route every six (6) hours as needed for Wheezing.   3 mL                 atorvastatin 20 mg tablet  Commonly known as:  LIPITOR    Your last dose was:      Your next dose is:          Take 1 Tab by mouth nightly.   20 mg                 azithromycin 250 mg tablet   Commonly known as:  ZITHROMAX    Your last dose was:      Your next dose is:          250 mg daily                  metoprolol tartrate 25 mg tablet  Commonly known as:  LOPRESSOR    Your last dose was:      Your next dose is:          Take 1 Tab by mouth every twelve (12) hours.   25 mg                 predniSONE 5 mg tablet  Commonly known as:  DELTASONE    Your last dose was:      Your next dose is:          Take 6 Tabs by mouth daily. Decreased by 5 mg each day. (30 mg, 25 mg, 20 mg, 15 mg, 10 mg, 5 mg )   30 mg                    CONTINUE taking these medications      Instructions Each Dose to Equal Morning Noon Evening Bedtime   aspirin delayed-release 81 mg tablet    Your last dose was:      Your next dose is:          Take  by mouth daily.  diazePAM 5 mg tablet  Commonly known as:  VALIUM    Your last dose was:      Your next dose is:          Take 5 mg by mouth every eight (8) hours as needed for Anxiety.   5 mg                 DULoxetine 60 mg capsule  Commonly known as:  CYMBALTA    Your last dose was:      Your next dose is:          Take 60 mg by mouth daily.   60 mg                 FISH OIL PO    Your last dose was:      Your next dose is:          Take  by mouth. [place]                  KEPPRA 500 mg tablet  Generic drug:  levETIRAcetam    Your last dose was:      Your next dose is:          Take 1,000 mg by mouth two (2) times a day.   1,000 mg                 LASIX 20 mg tablet  Generic drug:  furosemide    Your last dose was:      Your next dose is:          Take 20 mg by mouth daily.   20 mg                 lidocaine 5 %  Commonly known as:  LIDODERM    Your last dose was:      Your next dose is:          by TransDERmal route every twenty-four (24) hours. Apply patch to the affected area for 12 hours a day and remove for 12 hours a day.                  losartan 100 mg tablet  Commonly known as:  COZAAR    Your last dose was:      Your next dose is:           Take 100 mg by mouth nightly.   100 mg                 potassium 99 mg tablet    Your last dose was:      Your next dose is:          Take 99 mg by mouth daily.   99 mg                 PREMPRO 0.3-1.5 mg Tab  Generic drug:  estrogen (conjugated)-medroxyPROGESTERone    Your last dose was:      Your next dose is:          Take 1 Tab by mouth daily.   1 Tab                 PROAIR HFA 90 mcg/actuation inhaler  Generic drug:  albuterol    Your last dose was:      Your next dose is:          Take  by inhalation.  PROTONIX 40 mg tablet  Generic drug:  pantoprazole    Your last dose was:      Your next dose is:          Take 40 mg by mouth daily.   40 mg                 SYMBICORT 160-4.5 mcg/actuation Hfaa  Generic drug:  budesonide-formoterol    Your last dose was:      Your next dose is:          Take 2 Puffs by inhalation two (2) times a day.   2 Puff                 tiZANidine 2 mg capsule  Commonly known as:  ZANAFLEX    Your last dose was:      Your next dose is:          Take 2 mg by mouth nightly as needed.   2 mg                 VOLTAREN 1 % Gel  Generic drug:  diclofenac    Your last dose was:      Your next dose is:          Apply  to affected area four (4) times daily.                     STOP taking these medications    carvedilol 12.5 mg tablet  Commonly known as:  COREG              Where to Get Your Medications      These medications were sent to [facility] [address]  [place], [place] [address]    Phone:  [phone]   ?? albuterol-ipratropium 2.5 mg-0.5 mg/3 ml Nebu  ?? atorvastatin 20 mg tablet  ?? azithromycin 250 mg tablet  ?? metoprolol tartrate 25 mg tablet  ?? predniSONE 5 mg tablet             Follow-up Appointments and instructions:   Your PCP: Christa See, MD, within 3-5 days  Follow-up Information     Follow up With Specialties Details Why Contact Info    Christa See, [address]   [place] [address]  [phone]                  Treatment Team: Treatment Team: Attending Provider: Otila Kluver, MD; Consulting Provider: Jonnie Kind, DO      Thank you Dr Christa See, MD for allowing [place] to participate in the care of Lesly Rubenstein     [facility] medical dictation software was used for portions of this report.  Unintended voice transcription errors may have occurred.)      >42 minutes spent coordinating this discharge (review instructions/follow-up, prescriptions)    Signed:    [facility]  Otila Kluver, MD  03/23/2018  2:21 PM

## 2018-03-23 NOTE — Progress Notes (Addendum)
Discharge Plan:   Home      Discharge Date:     03/23/2018      Assisted Living Facility:            The facility confirmed that they are ready to receive the patient:    no     Does the patient have  an insurance company assigned cm   NO     Spoke with case manager  No     Collaborative dc plan n/a     Is patient going to snf? no      If patient is dialysis , Is dialysis set up needed if going to snf? n     Wound vac needed at time of dc to snf n/a     BIPAP needed at snf n/a     Any other equipment needs at snf? no     Home Health Needed:     [place]:    n/a     FOC given :      Confirmed start of care with the home health agency and spoke with:      n/a     DME needed and ordered for Discharge: Nebulizer     DME Company:    [place] confirmed delivery of DME: with      [facility]     TCC Referral:     yes     Medication Assistance given     Y/N        meds given (please list)     n/a     Change(MDC/SSDI) Referral/outcome    n/a      Transportation: Family will transport home

## 2018-03-23 NOTE — Other (Addendum)
----------  DocumentID: [id number]------------------------------------------------              [facility]                       Patient Education Report         Name: Hannah [name], Hannah [name]                  Date: 03/20/2018    MRN: [id number]                    Time: 3:42:27 PM         Patient ordered video: 'Patient Safety: Stay Safe While you are in the Hospital'    from [id number] via phone number: [phone] at 3:42:27 PM    Description: This program outlines some of the precautions patients can take to ensure a speedy recovery without extra complications. The video emphasizes the importance of communicating with the healthcare team.    ----------DocumentID: [id number]------------------------------------------------                       [facility]          Patient Education Report - Discharge Summary        Date: 03/23/2018   Time: 6:34:10 PM   Name: Hannah [name], Hannah [name]   MRN: [id number]      Account Number: [id number]      Education History:        Patient ordered video: 'Patient Safety: Stay Safe While you are in the Hospital' from [id number] on 03/20/2018 03:42:27 PM

## 2018-03-23 NOTE — Progress Notes (Signed)
Discharge Plan:   Home      Discharge Date:     03/23/2018      Assisted Living Facility:            The facility confirmed that they are ready to receive the patient:    no     Does the patient have  an insurance company assigned cm   NO     Spoke with case manager  No     Collaborative dc plan n/a     Is patient going to snf? no      If patient is dialysis , Is dialysis set up needed if going to snf? n     Wound vac needed at time of dc to snf n/a     BIPAP needed at snf n/a     Any other equipment needs at snf? no     Home Health Needed:     [place]:    n/a     FOC given :      Confirmed start of care with the home health agency and spoke with:      n/a     DME needed and ordered for Discharge: Nebulizer     DME Company:    [place] confirmed delivery of DME: with      Claib[facility]     TCC Referral:     yes     Medication Assistance given     Y/N        meds given (please list)     n/a     Change(MDC/SSDI) Referral/outcome    n/a      Transportation: Family will transport home

## 2018-03-23 NOTE — Discharge Summary (Signed)
Discharge Summary       Patient: Hannah [name] Age: 63 y.o. DOB: Jun 18, 1955 MR#: [id number] SSN: [id number]  PCP on record: Christa See, MD  Admit date: 03/20/2018  Discharge date: 03/23/2018    Admission Diagnoses:   COPD with acute exacerbation ([place]) [J44.1]  Hypoxia [R09.02]  COPD exacerbation ([place]) [J44.1]  -    Discharge Diagnoses:   Patient seen in follow up for multiple medical problems as listed below :  Acute hypoxic respiratory failure second to COPD exacerbation  Continue nasal cannula oxygen maintaining O2 sat greater than 88%  Continue bronchodilators with albuterol and Atrovent  IV Solu-Medrol switch to a prednisone taper  Short of breath and cough improved  ??  Acute bronchitis  Sputum culture if able to collection  Continue antibiotic with azithromycin  ??  COPD exacerbation secondary to above  Continue treatment as above  ??  Tobacco abuse  Smoking cessation discussed and encouraged. Risks of potentially life threatening consequences of continued smoking discussed. Patient is amenable to nicotine patch, but not yet willing to set a quit date.  ??  Chronic diastolic congestive heart failure without evidence of acute exacerbation  Continue Coreg, aspirin, Lasix, losartan  ??  GERD on a PPI at home  ??  Hypertension  Continue Coreg and losartan  Monitor pressure closely  ??  Anxiety  Continue home anxiety and pain regimen including Valium as needed,   ??  Seizure disorder  Continue home Keppra 1000 twice daily for seizure disorder  ??  Chronic pain on Cymbalta and muscle relaxant    Medical History   Hannah [name] is a 63 y.o. year old female medical history of chronic diastolic congestive heart failure, COPD, tobacco abuse who continues to smoke, GERD, hypertension, anxiety and seizure disorder presents from home today accompanied by her sister with whom she lives in her son.  Patient complains of approximately 1 week of gradually worsening shortness of breath and chest tightness.  She states her symptoms began as  nasal congestion for about 3 days a week ago associated with fever and chills at that time, she took over-the-counter Sudafed and it caused her to have significant palpitations.  She states since then her shortness of breath and palpitations have worsened, she has a cough with productive clear mucus, no recent fever or chills.  Denies any leg swelling or orthopnea symptoms.  When asked how many times she takes her albuterol she states "all the time ", estimates using her albuterol least 6-7 times today prior to arrival to the emergency room without relief of her chest tightness and wheezing.      Hospital Course by Problem   Patient  Patient received duo nebs x2 Solu-Medrol 125 with subjective improvement in chest tightness.  Chest x-ray without infiltrate.  Patient reported having a "prothrombotic state "denies being told of any DVT or PE however, unclear etiology for this diagnosis therefore CTA was done which did not reveal aortic dissection or PE but was consistent with prominent right hilar lymphadenopathy as well as left hilar lymphadenopathy and groundglass appearance in the bilateral lungs.  Patient was hypoxic down to 87% room air, was started on 3 L nasal cannula with improvement to 9394%.  No prior oxygen requirement    Acute hypoxic respiratory failure second to COPD exacerbation  Continue nasal cannula oxygen maintaining O2 sat greater than 88%  Continue bronchodilators with albuterol and Atrovent  IV Solu-Medrol switch to a prednisone taper  Short of breath and  cough improved  ??  Acute bronchitis  Sputum culture if unable to collection  Continue antibiotic with azithromycin  ??  COPD exacerbation secondary to above  Continue treatment as above  ??  Tobacco abuse  Smoking cessation discussed and encouraged. Risks of potentially life threatening consequences of continued smoking discussed. Patient is amenable to nicotine patch, but not yet willing to set a quit date.  ??  Chronic diastolic congestive heart  failure without evidence of acute exacerbation  Continue Lopressor  aspirin, Lasix, losartan  ??  GERD on a PPI at home  ??  Hypertension  Continue Lopressor  and losartan  Monitor pressure closely  ??  Anxiety  Continue home anxiety and pain regimen including Valium as needed,   ??  Seizure disorder  Continue home Keppra 1000 twice daily for seizure disorder  ??  Chronic pain on Cymbalta and muscle relaxant          Today's examination of the patient revealed:     Objective:   VS:   Visit Vitals  BP 124/46 (BP Patient Position: Supine)   Pulse 74   Temp 97.5 ??F (36.4 ??C)   Resp 18   Ht 5\' 4"  (1.626 m)   Wt 99.6 kg (219 lb 9.3 oz)   SpO2 94%   BMI 37.69 kg/m??      Tmax/24hrs: Temp (24hrs), Avg:97.9 ??F (36.6 ??C), Min:97.3 ??F (36.3 ??C), Max:98.6 ??F (37 ??C)     Input/Output:     Intake/Output Summary (Last 24 hours) at 03/23/2018 1421  Last data filed at 03/22/2018 2359  Gross per 24 hour   Intake 360 ml   Output ???   Net 360 ml       General appearance: no distress  Eyes: sclera anicteric  ENT: no oral lesions, thyroid normal  Skin: no spider angiomata, jaundice,   Respiratory: clear to auscultation bilaterally  Cardiovascular: regular heart rate, no murmurs, no JVD  Abdomen: soft, non-tender, no rebound  Extremities: no muscle wasting, no gross arthritic changes  GU: not examined  Neurologic: alert and oriented, cranial nerves grossly intact,         Labs:    No results found for this or any previous visit (from the past 24 hour(s)).  Additional Data Reviewed:      XR Results (most recent):  Results from Hospital Encounter encounter on 03/20/18   XR CHEST SNGL V    Narrative Indication: sob.    .      Impression IMPRESSION: Portable AP upright view the chest exposed at 11:00 AM March 20, 2018 reveals the lungs are clear. The heart is of normal size. Lower anterior  cervical fusion hardware is in place.Marland Kitchen              MRI Results (most recent):  No results found for this or any previous visit.    CT Results (most  recent):  Results from Hospital Encounter encounter on 03/20/18   CTA CHEST W OR W WO CONT    Narrative History: sob.  History of hypercoagulable state..          Impression Impression: No aortic dissection or pulmonary embolism.         Comment: 3D CTA images of the chest were performed with intravenous contrast.  Multiplanar PE protocol was utilized and MIPS projections were obtained.  This  was compared with low-dose CT of October 28, 2017 and October 29, 2016.    There is no evidence of  pulmonary embolism or aortic dissection.  The aorta is  of normal caliber. The heart is of normal size.  No pleural or pericardial  effusion.    Mildly prominent right hilar and to lesser extent left hilar lymph nodes are  observed. No other adenopathy.    Groundglass granularity is present throughout both lungs. No pulmonary  consolidation or mass.    DICOM format imaged data is available to non-affiliated external healthcare  facilities or entities on a secure, media free, reciprocally searchable basis  with patient authorization  for 12 months following the date of the study.                     Condition:       Diet:     Disposition:    [] Home   [] Home with Home Health   [] SNF/NH   [] Rehab   [] Home with family   [] Alternate Facility:____________________      Discharge Medications:        My Medications      START taking these medications      Instructions Each Dose to Equal Morning Noon Evening Bedtime   albuterol-ipratropium 2.5 mg-0.5 mg/3 ml Nebu  Commonly known as:  DUO-NEB    Your last dose was:      Your next dose is:          3 mL by Nebulization route every six (6) hours as needed for Wheezing.   3 mL                 atorvastatin 20 mg tablet  Commonly known as:  LIPITOR    Your last dose was:      Your next dose is:          Take 1 Tab by mouth nightly.   20 mg                 azithromycin 250 mg tablet  Commonly known as:  ZITHROMAX    Your last dose was:      Your next dose is:          250 mg daily                   metoprolol tartrate 25 mg tablet  Commonly known as:  LOPRESSOR    Your last dose was:      Your next dose is:          Take 1 Tab by mouth every twelve (12) hours.   25 mg                 predniSONE 5 mg tablet  Commonly known as:  DELTASONE    Your last dose was:      Your next dose is:          Take 6 Tabs by mouth daily. Decreased by 5 mg each day. (30 mg, 25 mg, 20 mg, 15 mg, 10 mg, 5 mg )   30 mg                    CONTINUE taking these medications      Instructions Each Dose to Equal Morning Noon Evening Bedtime   aspirin delayed-release 81 mg tablet    Your last dose was:      Your next dose is:          Take  by mouth daily.  diazePAM 5 mg tablet  Commonly known as:  VALIUM    Your last dose was:      Your next dose is:          Take 5 mg by mouth every eight (8) hours as needed for Anxiety.   5 mg                 DULoxetine 60 mg capsule  Commonly known as:  CYMBALTA    Your last dose was:      Your next dose is:          Take 60 mg by mouth daily.   60 mg                 FISH OIL PO    Your last dose was:      Your next dose is:          Take  by mouth. [place]                  KEPPRA 500 mg tablet  Generic drug:  levETIRAcetam    Your last dose was:      Your next dose is:          Take 1,000 mg by mouth two (2) times a day.   1,000 mg                 LASIX 20 mg tablet  Generic drug:  furosemide    Your last dose was:      Your next dose is:          Take 20 mg by mouth daily.   20 mg                 lidocaine 5 %  Commonly known as:  LIDODERM    Your last dose was:      Your next dose is:          by TransDERmal route every twenty-four (24) hours. Apply patch to the affected area for 12 hours a day and remove for 12 hours a day.                  losartan 100 mg tablet  Commonly known as:  COZAAR    Your last dose was:      Your next dose is:          Take 100 mg by mouth nightly.   100 mg                 potassium 99 mg tablet    Your last dose was:      Your next dose is:           Take 99 mg by mouth daily.   99 mg                 PREMPRO 0.3-1.5 mg Tab  Generic drug:  estrogen (conjugated)-medroxyPROGESTERone    Your last dose was:      Your next dose is:          Take 1 Tab by mouth daily.   1 Tab                 PROAIR HFA 90 mcg/actuation inhaler  Generic drug:  albuterol    Your last dose was:      Your next dose is:          Take  by inhalation.  PROTONIX 40 mg tablet  Generic drug:  pantoprazole    Your last dose was:      Your next dose is:          Take 40 mg by mouth daily.   40 mg                 SYMBICORT 160-4.5 mcg/actuation Hfaa  Generic drug:  budesonide-formoterol    Your last dose was:      Your next dose is:          Take 2 Puffs by inhalation two (2) times a day.   2 Puff                 tiZANidine 2 mg capsule  Commonly known as:  ZANAFLEX    Your last dose was:      Your next dose is:          Take 2 mg by mouth nightly as needed.   2 mg                 VOLTAREN 1 % Gel  Generic drug:  diclofenac    Your last dose was:      Your next dose is:          Apply  to affected area four (4) times daily.                     STOP taking these medications    carvedilol 12.5 mg tablet  Commonly known as:  COREG              Where to Get Your Medications      These medications were sent to [facility] [address]  [place], [place] [address]    Phone:  [phone]   ?? albuterol-ipratropium 2.5 mg-0.5 mg/3 ml Nebu  ?? atorvastatin 20 mg tablet  ?? azithromycin 250 mg tablet  ?? metoprolol tartrate 25 mg tablet  ?? predniSONE 5 mg tablet             Follow-up Appointments and instructions:   Your PCP: Christa See, MD, within 3-5 days  Follow-up Information     Follow up With Specialties Details Why Contact Info    Christa See, [address]  [place] [address]  [phone]                  Treatment Team: Treatment Team: Attending Provider: Otila Kluver, MD; Consulting Provider:  Jonnie Kind, DO      Thank you Dr Christa See, MD for allowing [place] to participate in the care of Lesly Rubenstein     [facility] medical dictation software was used for portions of this report.  Unintended voice transcription errors may have occurred.)      >42 minutes spent coordinating this discharge (review instructions/follow-up, prescriptions)    Signed:    [facility]  Otila Kluver, MD  03/23/2018  2:21 PM

## 2018-03-23 NOTE — Progress Notes (Signed)
 Patient admitted on 03/20/2018 from home with   Chief Complaint   Patient presents with   . Shortness of Breath   . Palpitations          The patient is being treated for    PMH:   Past Medical History:   Diagnosis Date   . Anginal pain (HCC)    . Anxiety and depression    . CHF (congestive heart failure) (HCC)     muscle   . Emphysema/COPD (HCC)    . Fibromyalgia    . GERD (gastroesophageal reflux disease)    . Hiatal hernia    . Hypercoagulable state (HCC)    . Hyperlipidemia    . Hypertension    . Nicotine vapor product user    . 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 Tachycardia         Treatment Team: Treatment Team: Attending Provider: Onita Pill, MD; Consulting Provider: Dwane Dragon, DO      The patient has been admitted to the hospital 0 times in the past 12 months.    Previous 4 Admission Dates Admission and Discharge Diagnosis Interventions Barriers Disposition                                 Patient and Family/Caregivers Goals of Care:  Caregivers Participating in Plan of Care/Discharge Plan with the patient:    Barriers to Healthcare Success/ Readmission Risk Factors:    Transitional Care Clinic Referral:  Transitional Nurse Navigator Referral:  Oncology Navigator Referral:    Food/Nutrition Needs: Dietician Consulted n SW Consulted n    Palliative Care Consult Recommended:   RRAT Score: Low Risk            11       Total Score        3 Has Seen PCP in Last 6 Months (Yes=3, No=0)    5 Pt. Coverage (Medicare=5 , Medicaid, or Self-Pay=4)    3 Charlson Comorbidity Score (Age + Comorbid Conditions)        Criteria that do not apply:    Married. Living with Significant Other. Assisted Living. LTAC. SNF. or   Rehab    Patient Length of Stay (>5 days = 3)    IP Visits Last 12 Months (1-3=4, 4=9, >4=11)         Tentative dc plan:     Anticipated DME needs for discharge:    The patient and care participants are willing to travel 10 m area for discharge facility.  The patient and plan of care  participants have been provided with a list of all available Rehab Facilities or Home Health agencies as applicable. CM will follow up with a list of facilities or agencies that are offer acceptance.    CM has disclosed any financial interest that [facility] may have with any facility or agency.    Anticipated Discharge Date:    The plan of care and discharge plan has been discussed with Onita Pill, MD and all other appropriate providers and adjusted per interdisciplinary team recommendations and in discussion with the patient and the patient designated Care Plan Participants.      PCP: Franchot Portal, MD . How do you get to your doctor appointments?    Specialists:     Dialysis Unit:    Pharmacy: [facility]. Are there any medications that you have trouble paying for?  Any difficulty getting your medications?    DME available at Home:    Home O2 L Flow:              Home O2 Provider:    Home Environment and Prior Level of Function: Lives at [address]  [place] [address] @HOMEPHONE @. Lives with family.  Multistory or 1 story.  Steps into home.  Responsibilities at home include  independent    Prior to admission open services:    Home Health Agency-  Personal Care Agency-    Extended Emergency Contact Information  Primary Emergency Contact: Done,Michael   [place]  Home Phone: [phone]  Mobile Phone: [phone]  Relation: Son     Transportation: Family will transport home    Therapy Recommendations:    OT =     PT =     SLP =      RT Home O2 Evaluation =      Wound Care =      [facility] Consulted:    Outside Hospital/Community Resources Referrals and Collaboration:    Case Management Assessment    ABUSE/NEGLECT SCREENING   Physical Abuse/Neglect: Denies   Sexual Abuse: Denies   Sexual Abuse: Denies   Other Abuse/Issues: Denies          PRIMARY DECISION MAKER                                   CARE MANAGEMENT INTERVENTIONS   Readmission Interview  Completed: Not Applicable   PCP Verified by CM: Yes                   Transition of Care Consult (CM Consult): Discharge Planning                               Current Support Network: Own Home   Reason for Referral: DCP Rounds   History Provided By: Patient   Patient Orientation: Alert and Oriented, Person, Place, Situation, Self   Cognition: Alert   Support System Response: Unavailable   Previous Living Arrangement: Lives with Family Independent   Home Accessibility: Steps   Prior Functional Level: Independent in ADLs/IADLs   Current Functional Level: Independent in ADLs/IADLs       Can patient return to prior living arrangement: Yes   Ability to make needs known:: Good   Family able to assist with home care needs:: Yes                       Confirm Follow Up Transport: Family                  DISCHARGE LOCATION   Discharge Placement: Home

## 2018-08-21 ENCOUNTER — Inpatient Hospital Stay: Admit: 2018-08-21 | Payer: MEDICARE | Attending: Family Medicine | Primary: Family Medicine

## 2018-08-21 ENCOUNTER — Encounter

## 2018-08-21 DIAGNOSIS — M545 Low back pain: Secondary | ICD-10-CM

## 2018-09-11 ENCOUNTER — Encounter

## 2018-09-11 ENCOUNTER — Inpatient Hospital Stay: Admit: 2018-09-11 | Payer: MEDICARE | Primary: Family Medicine

## 2018-09-11 DIAGNOSIS — M2011 Hallux valgus (acquired), right foot: Secondary | ICD-10-CM

## 2018-10-07 DIAGNOSIS — I10 Essential (primary) hypertension: Secondary | ICD-10-CM | POA: Insufficient documentation

## 2018-11-16 ENCOUNTER — Encounter

## 2018-12-09 ENCOUNTER — Inpatient Hospital Stay: Payer: MEDICARE | Attending: Family Medicine | Primary: Family Medicine

## 2018-12-30 ENCOUNTER — Ambulatory Visit: Payer: MEDICARE | Primary: Family Medicine

## 2019-03-25 ENCOUNTER — Encounter

## 2019-04-01 ENCOUNTER — Encounter

## 2019-04-01 ENCOUNTER — Inpatient Hospital Stay: Admit: 2019-04-01 | Payer: MEDICARE | Primary: Family Medicine

## 2019-04-01 DIAGNOSIS — Z87891 Personal history of nicotine dependence: Secondary | ICD-10-CM

## 2019-04-02 ENCOUNTER — Inpatient Hospital Stay: Admit: 2019-04-02 | Payer: MEDICARE | Attending: Family Medicine | Primary: Family Medicine

## 2019-04-02 DIAGNOSIS — K760 Fatty (change of) liver, not elsewhere classified: Secondary | ICD-10-CM

## 2019-04-02 LAB — AMB POC CREATININE: Creatinine: 0.8 mg/dl (ref 0.6–1.3)

## 2019-04-02 LAB — CREATININE, POC: Creatinine: 0.8 mg/dl (ref 0.6–1.3)

## 2019-04-02 MED ORDER — IOPAMIDOL 61 % IV SOLN
300 mg iodine /mL (61 %) | Freq: Once | INTRAVENOUS | Status: AC
Start: 2019-04-02 — End: 2019-04-02
  Administered 2019-04-02: 19:00:00 via INTRAVENOUS

## 2019-04-02 MED FILL — ISOVUE-300  61 % INTRAVENOUS SOLUTION: 300 mg iodine /mL (61 %) | INTRAVENOUS | Qty: 100

## 2019-04-05 NOTE — Progress Notes (Signed)
LUNG CANCER SCREENING:  This Letter was mailed to the Patient and faxed to the ordering physician.    Hannah [name]  [address]  [place] [address]       Dear Hannah [name],  RE: Your low-dose CT lung screening done on: 04/01/2019  Interpreted by: Dr. Serita Grit  Report sent to: Dr. Jerilynn Mages. Posey Pronto  Thank you for choosing [facility] for your recent CT lung screening. We are sending this follow-up letter to confirm your testing results. Your CT lung screening showed no nodule or mass.  Your next lung screening exam is scheduled for: January 2022. Because this notice is far in advance, we will send you a reminder when the time gets closer.  Here are some other important points:  ? Please remember that you have freedom of choice when choosing a health care facility.   ? Your full low-dose CT lung screening report, including any minor observations, has been sent to your health care provider. Your exam report and images will be kept on file at [facility] as part of your permanent record and are available for your continuing care.  ? Although low-dose CT lung screening is very effective at finding lung cancer early, it cannot find all lung cancers. If you develop any new symptoms such as shortness of breath or chest pain, or you begin coughing up blood, please contact your doctor immediately.  ? Please keep in mind that good health involves quitting smoking (for help, call 1-800-QUIT-NOW), an annual physical exam and a yearly low-dose CT lung screening.  As I stated during our last phone conversation, I am always here to answer any questions you may have. If you have further questions about your results, questions about this letter or difficulty contacting your health care provider, please don???t hesitate to call me at [phone].   Sincerely,       Schuyler Amor, R.N., B.S.N.  Thoracic and Lung Health Nurse Navigator  Phone: [phone]  Cc Dr. Jerilynn Mages. Patel  04/05/2019

## 2019-04-05 NOTE — Progress Notes (Signed)
 LUNG CANCER SCREENING:  This Letter was mailed to the Patient and faxed to the ordering physician.    Hannah [name]  [address]  [place] [address]       Dear Ms. [name],  RE: Your low-dose CT lung screening done on: 04/01/2019  Interpreted by: Dr. CANDIE Blanch  Report sent to: Dr. CHRISTELLA. Blanch  Thank you for choosing [facility] for your recent CT lung screening. We are sending this follow-up letter to confirm your testing results. Your CT lung screening showed no nodule or mass.  Your next lung screening exam is scheduled for: January 2022. Because this notice is far in advance, we will send you a reminder when the time gets closer.  Here are some other important points:  . Please remember that you have freedom of choice when choosing a health care facility.   . Your full low-dose CT lung screening report, including any minor observations, has been sent to your health care provider. Your exam report and images will be kept on file at [facility] as part of your permanent record and are available for your continuing care.  . Although low-dose CT lung screening is very effective at finding lung cancer early, it cannot find all lung cancers. If you develop any new symptoms such as shortness of breath or chest pain, or you begin coughing up blood, please contact your doctor immediately.  . Please keep in mind that good health involves quitting smoking (for help, call 1-800-QUIT-NOW), an annual physical exam and a yearly low-dose CT lung screening.  As I stated during our last phone conversation, I am always here to answer any questions you may have. If you have further questions about your results, questions about this letter or difficulty contacting your health care provider, please don't hesitate to call me at [phone].   Sincerely,       Andres Mose, R.N., B.S.N.  Thoracic and Lung Health Nurse Navigator  Phone: [phone]  Cc Dr. CHRISTELLA. Patel  04/05/2019

## 2019-05-20 ENCOUNTER — Inpatient Hospital Stay: Payer: MEDICARE | Attending: Family Medicine | Primary: Family Medicine

## 2019-06-11 ENCOUNTER — Ambulatory Visit: Payer: MEDICARE | Primary: Family Medicine

## 2019-09-14 ENCOUNTER — Other Ambulatory Visit: Payer: Self-pay

## 2019-09-14 ENCOUNTER — Encounter: Payer: Self-pay | Admitting: Family Medicine

## 2019-09-14 ENCOUNTER — Ambulatory Visit (INDEPENDENT_AMBULATORY_CARE_PROVIDER_SITE_OTHER): Payer: Medicare Other | Admitting: Family Medicine

## 2019-09-14 VITALS — BP 118/68 | HR 81 | Temp 97.6°F | Ht 62.5 in | Wt 191.2 lb

## 2019-09-14 DIAGNOSIS — I10 Essential (primary) hypertension: Secondary | ICD-10-CM

## 2019-09-14 DIAGNOSIS — G40909 Epilepsy, unspecified, not intractable, without status epilepticus: Secondary | ICD-10-CM

## 2019-09-14 DIAGNOSIS — M797 Fibromyalgia: Secondary | ICD-10-CM

## 2019-09-14 DIAGNOSIS — J439 Emphysema, unspecified: Secondary | ICD-10-CM | POA: Diagnosis not present

## 2019-09-14 DIAGNOSIS — R911 Solitary pulmonary nodule: Secondary | ICD-10-CM | POA: Diagnosis not present

## 2019-09-14 DIAGNOSIS — F419 Anxiety disorder, unspecified: Secondary | ICD-10-CM | POA: Insufficient documentation

## 2019-09-14 DIAGNOSIS — K219 Gastro-esophageal reflux disease without esophagitis: Secondary | ICD-10-CM

## 2019-09-14 DIAGNOSIS — I6529 Occlusion and stenosis of unspecified carotid artery: Secondary | ICD-10-CM | POA: Diagnosis not present

## 2019-09-14 DIAGNOSIS — M25561 Pain in right knee: Secondary | ICD-10-CM

## 2019-09-14 DIAGNOSIS — L821 Other seborrheic keratosis: Secondary | ICD-10-CM

## 2019-09-14 MED ORDER — LOSARTAN POTASSIUM 100 MG PO TABS: 100.0000 mg | ORAL_TABLET | Freq: Every day | ORAL | 0 refills | Status: DC

## 2019-09-14 MED ORDER — LOSARTAN POTASSIUM 100 MG PO TABS: 100.0000 mg | ORAL_TABLET | Freq: Every day | ORAL | 3 refills | Status: DC

## 2019-09-14 NOTE — Assessment & Plan Note (Signed)
Cont keppra. Stable on current dose. Referral to neurology as recently moved.

## 2019-09-14 NOTE — Assessment & Plan Note (Signed)
Encouraged trial of protonix as she has lost weight and is eating healthy. She will stop taking this medication

## 2019-09-14 NOTE — Assessment & Plan Note (Signed)
Muscle spasms in legs. Stable on tizanidine. Prn valium but discussed as new patient cannot fill today and recommended return visit. She will trial higher dose of tizanidine. Cont voltaren gel prn

## 2019-09-14 NOTE — Patient Instructions (Addendum)
#   Try stopping the Protonix - if symptoms return can try lower dose for a few weeks  Can try taking 4-8 mg of Tizanidine as needed for muscle spasms  #Knee pain - continue voltaren gel - avoid movement that causes pain - Call or mychart if not improving in 2-4 weeks   #Face lesion - non-cancerous - if worsening can consider freezing off   You can call for a mammogram at these locations:  [facility].  [place] [facility] [place] [address] [phone]

## 2019-09-14 NOTE — Assessment & Plan Note (Signed)
BP stable. Cont losartan, coreg, co-enzyme q-10. Reports CAD but not on statin, did not address today will assess at follow-up and check fasting labs. Cardiology referral at request.

## 2019-09-14 NOTE — Assessment & Plan Note (Signed)
Discussed avoiding irritating activity and using voltaren gel. If not improving let me know and can plan for PT or imaging.

## 2019-09-14 NOTE — Assessment & Plan Note (Signed)
Stable with rare use of albuterol. Cont inhalers and singulair. Pulm referral as has a lung nodule which was being followed by pulm

## 2019-09-14 NOTE — Assessment & Plan Note (Signed)
Skin lesion benign, if growing can do LN2

## 2019-09-14 NOTE — Progress Notes (Signed)
Subjective:     Lindsay Morse is a 64 y.o. female presenting for Establish Care, Leg Pain (Behind right knee), and spot on face (under right eye x 6 months )     HPI   #spot on face - lesion for 6 months - not sure what it is - has not grown - is not painful - appears scaly normally  #Knee pain - posterior knee on the right side - symptoms since Sunday - could not put weight on her leg - is able to walk    #fibromyalgia  - has been on disability  - was having difficulty sleeping at night  #COPD - hx of lesion on lungs - due for CT scan - needs referral to pulmonologist - doing well - rare use of rescue inhaler  #seizure disorder - was following with neurology - on keppra - last seizure Oct 2015 - did have an episode where she woke up and had chewed on her tongue in 2018 and dose was increased   #HTN - taking medication - good control - endorses angina and has nitro - no recent use   # Muscle spasms - #90 tablets lasted about 6 months - has tizanidine 4 mg every night - will take valium if she is still having symptoms    Review of Systems   Social History   Tobacco Use  Smoking Status Former Smoker  . Packs/day: 0.50  . Years: 43.00  . Pack years: 21.50  . Types: Cigarettes  Smokeless Tobacco Former Systems developer  . Quit date: 08/12/2013        Objective:    BP Readings from Last 3 Encounters:  09/14/19 118/68  09/14/14 (!) 157/88  10/12/13 130/80   Wt Readings from Last 3 Encounters:  09/14/19 191 lb 4 oz (86.8 kg)  01/24/15 182 lb (82.6 kg)  09/14/14 182 lb (82.6 kg)    BP 118/68   Pulse 81   Temp 97.6 F (36.4 C) (Temporal)   Ht 5' 2.5" (1.588 m)   Wt 191 lb 4 oz (86.8 kg)   SpO2 96%   BMI 34.42 kg/m    Physical Exam Constitutional:      General: She is not in acute distress.    Appearance: She is well-developed. She is not diaphoretic.  HENT:     Right Ear: External ear normal.     Left Ear: External ear normal.  Eyes:      Conjunctiva/sclera: Conjunctivae normal.  Cardiovascular:     Rate and Rhythm: Normal rate.  Pulmonary:     Effort: Pulmonary effort is normal.  Musculoskeletal:     Cervical back: Neck supple.     Comments: Right knee Inspection: no swelling Palpation: TTP on the lateral joint line ROM: normal   Skin:    General: Skin is warm and dry.     Capillary Refill: Capillary refill takes less than 2 seconds.     Comments: Right cheek with small yellow scaly lesion  Neurological:     Mental Status: She is alert. Mental status is at baseline.  Psychiatric:        Mood and Affect: Mood normal.        Behavior: Behavior normal.           Assessment & Plan:   Problem List Items Addressed This Visit      Cardiovascular and Mediastinum   Carotid artery calcification   Relevant Medications   carvedilol (COREG) 12.5 MG tablet  bumetanide (BUMEX) 1 MG tablet   losartan (COZAAR) 100 MG tablet   losartan (COZAAR) 100 MG tablet   Other Relevant Orders   Ambulatory referral to Cardiology   Hypertension - Primary    BP stable. Cont losartan, coreg, co-enzyme q-10. Reports CAD but not on statin, did not address today will assess at follow-up and check fasting labs. Cardiology referral at request.       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 Cont inhalers and singulair. Pulm referral as has a lung nodule which was being followed by pulm      Relevant Medications   montelukast (SINGULAIR) 10 MG tablet   Other Relevant Orders   Ambulatory referral to Pulmonology     Digestive   GERD (gastroesophageal reflux disease)    Encouraged trial of protonix as she has lost weight and is eating healthy. She will stop  taking this medication        Nervous and Auditory   Seizure disorder ([place])    Cont keppra. Stable on current dose. Referral to neurology as recently moved.       Relevant Medications   levETIRAcetam (KEPPRA) 1000 MG tablet   Other Relevant Orders   Ambulatory referral to Neurology     Musculoskeletal and Integument   Seborrheic keratoses    Skin lesion benign, if growing can do LN2        Other   Fibromyalgia    Muscle spasms in legs. Stable on tizanidine. Prn valium but discussed as new patient cannot fill today and recommended return visit. She will trial higher dose of tizanidine. Cont voltaren gel prn      Relevant Medications   levETIRAcetam (KEPPRA) 1000 MG tablet   tiZANidine (ZANAFLEX) 4 MG tablet   Lung nodule   Relevant Orders   Ambulatory referral to Pulmonology   Acute pain of right knee    Discussed avoiding irritating activity and using voltaren gel. If not improving let me know and can plan for PT or imaging.           Return in about 6 months (around 03/15/2020).  Lesleigh Noe, MD  This visit occurred during the SARS-CoV-2 public health emergency.  Safety protocols were in place, including screening questions prior to the visit, additional usage of staff PPE, and extensive cleaning of exam room while observing appropriate contact time as indicated for disinfecting solutions.

## 2019-10-13 ENCOUNTER — Other Ambulatory Visit: Payer: Self-pay

## 2019-10-13 DIAGNOSIS — M797 Fibromyalgia: Secondary | ICD-10-CM

## 2019-10-13 NOTE — Telephone Encounter (Signed)
Pt left v/m requesting refill on tizanidine 4 mg to [facility] by mail and a refill of tizanidine 4 mg to local [facility] until get rx from mail order. Pt request cb when refills done. 09/14/19 pt established care and in note had can try tizanidine 4 - 8 mg prn for muscle spasms. Dr [name] has not prescribed the tizanidine before.

## 2019-10-13 NOTE — Telephone Encounter (Signed)
How often is she taking Tizanidine?  How many tablets at a time?  Please notify her that Dr. [name] is out of the office, but I can send a supply to her local pharmacy. Let me know.

## 2019-10-14 NOTE — Telephone Encounter (Signed)
Spoke with Ms. Loria. She states she takes one tizanidine at bedtime.  Please send refill to [facility], then forward to Dr. [name] to send to [facility] upon her return.

## 2019-10-15 MED ORDER — TIZANIDINE HCL 4 MG PO TABS: 4.0000 mg | ORAL_TABLET | Freq: Every day | ORAL | 0 refills | Status: DC

## 2019-10-15 NOTE — Telephone Encounter (Signed)
Noted, Rx sent over to [facility] which will last one month, will forward to PCP for continuing Rx through mail order.

## 2019-10-18 ENCOUNTER — Ambulatory Visit: Payer: Medicare Other | Admitting: Cardiology

## 2019-10-19 ENCOUNTER — Ambulatory Visit: Payer: Medicare Other | Admitting: Cardiology

## 2019-10-20 ENCOUNTER — Telehealth: Payer: Self-pay | Admitting: Pulmonary Disease

## 2019-10-20 NOTE — Telephone Encounter (Signed)
Per Dr. Patsey Berthold verbally- can reschedule unless patient is having new or worsen symptoms.  Spoke to pt, who stated that she is not having any symptoms. She would like to reschedule, due to her grandson being sick.  OV has been rescheduled for 12/07/2019 at 10:30.

## 2019-10-21 ENCOUNTER — Institutional Professional Consult (permissible substitution): Payer: Medicare Other | Admitting: Pulmonary Disease

## 2019-11-04 NOTE — Progress Notes (Deleted)
Cardiology Office Note  Date:  11/04/2019   ID:  Lindsay Morse, DOB Jan 07, 1956, MRN [id number]  PCP:  Lindsay Noe, MD   No chief complaint on file.   HPI:  Ms. Lindsay Morse is a 64 year old woman with PMH of  PAD, mild bilateral carotid disease 2015,  coronary calcification on CT scan Hyperlipidemia Fibromyalgia COPD Seizure disorder Hypertension Referred by Waunita Schooner for consultation of her coronary artery calcification, hypertension  CT scan reviewed from July 2016 Minimal coronary calcification No significant aortic atherosclerosis Multilevel DJD  No recent lipid panel available Last cholesterol in the system 7 years ago, total cholesterol 269 with LDL 175 Notes indicating prior statin intolerance  Stress test 2015, no ischemia  Carotid ultrasound 39% bilateral disease August 2015  PMH:   has a past medical history of Anxiety, Carotid arterial disease ([place]), COPD (chronic obstructive pulmonary disease) ([place]), Depression, Depression, Fibromyalgia, GERD (gastroesophageal reflux disease) (05/28/2013), HTN (hypertension), Hyperlipidemia, Irritable bowel syndrome, OA (osteoarthritis), Personal history of colonic polyps, Suicidal behavior, and Syncope and collapse.  PSH:    Past Surgical History:  Procedure Laterality Date  . BREAST SURGERY  2019   benign nodule removed  . CARPAL TUNNEL RELEASE  2007   right  . CERVICAL SPINE SURGERY  2016  . CHOLECYSTECTOMY     Dr. Emilio Math  . COLONOSCOPY  2008   repeat every 5 years,polyps- [place], [place] (03/29/2005-Hyperplastic polyp)  . TUBAL LIGATION  1978  . UPPER GASTROINTESTINAL ENDOSCOPY  6/11   duodenitis,maloney dilated    Current Outpatient Medications  Medication Sig Dispense Refill  . albuterol (PROVENTIL HFA;VENTOLIN HFA) 108 (90 BASE) MCG/ACT inhaler Inhale 2 puffs into the lungs every 6 (six) hours as needed for wheezing. 3 Inhaler 1  . Ascorbic Acid (VITAMIN C) 100 MG tablet Take 100 mg by mouth daily.    Marland Kitchen  aspirin 81 MG tablet Take 81 mg by mouth daily.    . budesonide-formoterol (SYMBICORT) 160-4.5 MCG/ACT inhaler Inhale 2 puffs into the lungs 2 (two) times daily. 3 Inhaler 3  . bumetanide (BUMEX) 1 MG tablet Take 1 mg by mouth daily.    . calcium-vitamin D (OSCAL WITH D) 500-200 MG-UNIT tablet Take 1 tablet by mouth.    . carvedilol (COREG) 12.5 MG tablet Take 12.5 mg by mouth 2 (two) times daily with a meal.    . co-enzyme Q-10 30 MG capsule Take 100 mg by mouth daily.    . diazepam (VALIUM) 5 MG tablet Take 5 mg by mouth every 6 (six) hours as needed for anxiety.    . diclofenac sodium (VOLTAREN) 1 % GEL Apply 4 g topically 4 (four) times daily as needed. 500 g 3  . levETIRAcetam (KEPPRA) 1000 MG tablet Take 1,000 mg by mouth 2 (two) times daily.    Marland Kitchen lidocaine (LIDODERM) 5 % Place 2 patches onto the skin daily. Remove & Discard patch within 12 hours or as directed by MD 180 patch 3  . losartan (COZAAR) 100 MG tablet Take 1 tablet (100 mg total) by mouth daily. 90 tablet 3  . losartan (COZAAR) 100 MG tablet Take 1 tablet (100 mg total) by mouth daily. 30 tablet 0  . Magnesium ([employer]S TRIPLE MAGNESIUM COMPLEX) 400 MG CAPS Take by mouth 1 day or 1 dose.    . milk thistle 175 MG tablet Take 1,000 mg by mouth daily.    . montelukast (SINGULAIR) 10 MG tablet Take 10 mg by mouth at bedtime.    . pantoprazole (  PROTONIX) 40 MG tablet Take 1 tablet (40 mg total) by mouth daily. 90 tablet 3  . tiZANidine (ZANAFLEX) 4 MG tablet Take 1 tablet (4 mg total) by mouth at bedtime. As needed for muscle spasm. 30 tablet 0  . vitamin B-12 (CYANOCOBALAMIN) 1000 MCG tablet Take 1,000 mcg by mouth daily.    Marland Kitchen zinc gluconate 50 MG tablet Take 50 mg by mouth daily.     No current facility-administered medications for this visit.     Allergies:   Regadenoson and Milnacipran   Social History:  The patient  reports that she has quit smoking. Her smoking use included cigarettes. She has a 21.50 pack-year smoking  history. She quit smokeless tobacco use about 6 years ago. She reports that she does not drink alcohol and does not use drugs.   Family History:   family history includes Alcohol abuse in her father; Asthma in her mother; Colon cancer in her maternal aunt and maternal grandfather; Coronary artery disease in her father and sister; Crohn's disease in her sister; Heart attack in her father and sister; Hyperlipidemia in her father and mother; Hypertension in her mother; Thyroid disease in her sister; Uterine cancer in her mother.    Review of Systems: ROS   PHYSICAL EXAM: VS:  There were no vitals taken for this visit. , BMI There is no height or weight on file to calculate BMI. GEN: Well nourished, well developed, in no acute distress HEENT: normal Neck: no JVD, carotid bruits, or masses Cardiac: RRR; no murmurs, rubs, or gallops,no edema  Respiratory:  clear to auscultation bilaterally, normal work of breathing GI: soft, nontender, nondistended, + BS MS: no deformity or atrophy Skin: warm and dry, no rash Neuro:  Strength and sensation are intact Psych: euthymic mood, full affect    Recent Labs: No results found for requested labs within last 8760 hours.    Lipid Panel Lab Results  Component Value Date   CHOL 269 (H) 10/05/2012   HDL 41.80 10/05/2012   TRIG 273.0 (H) 10/05/2012      Wt Readings from Last 3 Encounters:  09/14/19 191 lb 4 oz (86.8 kg)  01/24/15 182 lb (82.6 kg)  09/14/14 182 lb (82.6 kg)       ASSESSMENT AND PLAN:  Problem List Items Addressed This Visit    None       Disposition:   F/U  12 months   Total encounter time more than 25 minutes  Greater than 50% was spent in counseling and coordination of care with the patient    Signed, Esmond Plants, M.D., Ph.D. [place], [place]

## 2019-11-05 ENCOUNTER — Telehealth: Payer: Self-pay | Admitting: Family Medicine

## 2019-11-05 ENCOUNTER — Ambulatory Visit: Payer: Medicare Other | Admitting: Cardiovascular Disease

## 2019-11-05 NOTE — Telephone Encounter (Signed)
I forgot to put she received the medication Anda Kraft sent in for the 30 day supply  She is needing the regular prescription to go to [facility].

## 2019-11-05 NOTE — Telephone Encounter (Signed)
Pt said she needs to know what is going on with her Tizanidine refill going to [facility]. She called them and they still haven't received it. She would like a call back regarding this.

## 2019-11-08 ENCOUNTER — Other Ambulatory Visit: Payer: Self-pay

## 2019-11-08 DIAGNOSIS — M797 Fibromyalgia: Secondary | ICD-10-CM

## 2019-11-08 MED ORDER — TIZANIDINE HCL 4 MG PO TABS: 4.0000 mg | ORAL_TABLET | Freq: Every day | ORAL | 0 refills | Status: AC

## 2019-11-08 NOTE — Telephone Encounter (Signed)
Spoke to pt and let her know that the Tizanidine was sent into [facility] as requested.  Pt also requested that her Keppra level get added on to her lab orders that she is due to come in for, so that her insurance will cover it.

## 2019-11-18 ENCOUNTER — Telehealth: Payer: Self-pay | Admitting: Family Medicine

## 2019-11-18 NOTE — Telephone Encounter (Signed)
Opened in error

## 2019-11-30 ENCOUNTER — Ambulatory Visit (INDEPENDENT_AMBULATORY_CARE_PROVIDER_SITE_OTHER): Payer: Medicare Other | Admitting: Family Medicine

## 2019-11-30 ENCOUNTER — Telehealth: Payer: Self-pay | Admitting: General Practice

## 2019-11-30 ENCOUNTER — Other Ambulatory Visit: Payer: Self-pay

## 2019-11-30 NOTE — Progress Notes (Signed)
Patient brought list of labs that her Neurologist wanted to order for her. List including screening for anemia (iron, ferritin, vit b12) as well as thyroid, cmp.   Discussed that I could order the blood work but depending on the indication it was possible that medicare would not cover the labs.   She explained that her previous PCP ordered all necessary blood work including monitoring and prescribing of [place]. She also expressed that she would plan to find a new PCP.   Will remove my name from PCP and no charge for today's visit as patient left following brief discussion.

## 2019-11-30 NOTE — Telephone Encounter (Signed)
Pt called in asking to speak with [facility]. She is requesting a call back as soon as possible. Her callback number is [phone].

## 2019-12-06 NOTE — Telephone Encounter (Signed)
On 11/30/19 around 5pm, I contacted patient back at her request to discuss her concerns from office visit today.   Patient began by discussing her new patient appt with Dr. [name] from June 2021.  She states that Dr. [name] informed her that she would not be refilling her diazepam at this visit and that if patient wanted a refill she would have to come back to another visit to discuss.  Patient states that "all this did was tell me that she just wants my money and doesn't care about me as a patient".  In addition she went on to explain that Dr. [name] also refused to refill her Protonix.  Patient claims, that Dr. Verda Cumins reasoning was "because she and 1 or 2 other doctors here just don't like it".    Patient began conversation in a calm tone and continued to escalate through the conversation.  When I tried to respond, she often cut me off and wouldn't let me finish.  I did attempt to explain to her that diazepam is considered a controlled substance and further explain the controlled substance management policy.  She quickly cut me off and said that she knew that but was more upset about the protonix.  I apologized to her for any perceived miscommunication around this and tried to explain with Protonix (and PPI's in general) that there is research out there that supports trying a reduction trial or discontinuation of use in long term users due to potential long term negative side effects on the kidneys as well as other side effects and decreased effectiveness.     Patient became inflamed further with this and stated, "well then why wouldn't she check my kidneys today with my blood work"?  Patient's issue with today's visit is because she brought in a long list of blood work that the neurologist ordered and patient wanted Dr. [name] to order and have drawn here.  Dr. [name] informed patient that she would order the labs but wanted patient to know up front that it is possible that Medicare would not cover the blood tests if  she couldn't see and use the correct diagnose codes that the neurologist would have used.  Dr. [name] attempted to explain this further but patient became angry and stated that it was no point in her being there today and she left.  No examination was completed as patient would not allow Dr. [name] to explain further.  (Refer to the encounter note on 11/30/19 for details).       I did attempt to explain that it is our policy for the provider to determine whether they can safely order blood work from another provider.  I tried to explain that it is sometimes hard for another provider to determine why a specialist wanted a certain test done and if they can't see the reason in the notes, then they have to guess at a code which may not be the code covered by the insurance.  Also, I went on to state that it can be a liability for a provider to perform tests ordered by an outside doctor because someone has to be responsible for timely communication and interpretation of the labs.  The physician who wants the labs should be the one to interpret and communicate with the patient.  Dr. [name] was following our policy to explain to patient up front any potential concerns.   Patient did not feel that I adequately addressed her concerns even though I did apologize that she had  a perceived negative experience.  She did not remember Dr. [name] telling her she would draw the labs today and did not appreciate how she was treated.  Patient states she will find care elsewhere and asked me for a name and number where she could escalate the complaint.  I gave her Hilbert Corrigan with Patient experience and the number [phone] if she wanted to discuss further.  I did not know what else to offer her at this point.

## 2019-12-07 ENCOUNTER — Institutional Professional Consult (permissible substitution): Payer: Medicare Other | Admitting: Pulmonary Disease

## 2019-12-22 ENCOUNTER — Other Ambulatory Visit: Payer: Self-pay | Admitting: Internal Medicine

## 2019-12-22 DIAGNOSIS — M48061 Spinal stenosis, lumbar region without neurogenic claudication: Secondary | ICD-10-CM

## 2019-12-23 ENCOUNTER — Other Ambulatory Visit: Payer: Self-pay | Admitting: Internal Medicine

## 2019-12-23 DIAGNOSIS — Z1231 Encounter for screening mammogram for malignant neoplasm of breast: Secondary | ICD-10-CM

## 2020-01-01 DIAGNOSIS — I700 Atherosclerosis of aorta: Secondary | ICD-10-CM | POA: Insufficient documentation

## 2020-01-01 DIAGNOSIS — I739 Peripheral vascular disease, unspecified: Secondary | ICD-10-CM | POA: Insufficient documentation

## 2020-01-01 NOTE — Progress Notes (Deleted)
Cardiology Office Note  Date:  01/01/2020   ID:  Lindsay Morse, DOB 1956-01-29, MRN [id number]  PCP:  Gladstone Lighter, MD   No chief complaint on file.   HPI:    HTN PAD, carotid artery calcification Seizure d/o diffuse disc bulges at various levels and also moderate central canal stenosis CAD, 30% disease Leg edema Emphysema/asthma  CXT chest 2016 Carotid, last u/s 2015, mild, 39%  Echo 2015   PMH:   has a past medical history of Anxiety, Carotid arterial disease ([place]), COPD (chronic obstructive pulmonary disease) ([place]), Depression, Depression, Fibromyalgia, GERD (gastroesophageal reflux disease) (05/28/2013), HTN (hypertension), Hyperlipidemia, Irritable bowel syndrome, OA (osteoarthritis), Personal history of colonic polyps, Suicidal behavior, and Syncope and collapse.  PSH:    Past Surgical History:  Procedure Laterality Date  . BREAST SURGERY  2019   benign nodule removed  . CARPAL TUNNEL RELEASE  2007   right  . CERVICAL SPINE SURGERY  2016  . CHOLECYSTECTOMY     Dr. Emilio Math  . COLONOSCOPY  2008   repeat every 5 years,polyps- [place], [place] (03/29/2005-Hyperplastic polyp)  . TUBAL LIGATION  1978  . UPPER GASTROINTESTINAL ENDOSCOPY  6/11   duodenitis,maloney dilated    Current Outpatient Medications  Medication Sig Dispense Refill  . albuterol (PROVENTIL HFA;VENTOLIN HFA) 108 (90 BASE) MCG/ACT inhaler Inhale 2 puffs into the lungs every 6 (six) hours as needed for wheezing. 3 Inhaler 1  . Ascorbic Acid (VITAMIN C) 100 MG tablet Take 100 mg by mouth daily.    Marland Kitchen aspirin 81 MG tablet Take 81 mg by mouth daily.    . budesonide-formoterol (SYMBICORT) 160-4.5 MCG/ACT inhaler Inhale 2 puffs into the lungs 2 (two) times daily. 3 Inhaler 3  . bumetanide (BUMEX) 1 MG tablet Take 1 mg by mouth daily.    . calcium-vitamin D (OSCAL WITH D) 500-200 MG-UNIT tablet Take 1 tablet by mouth.    . carvedilol (COREG) 12.5 MG tablet Take 12.5 mg by mouth 2 (two) times daily with a  meal.    . co-enzyme Q-10 30 MG capsule Take 100 mg by mouth daily.    . diclofenac sodium (VOLTAREN) 1 % GEL Apply 4 g topically 4 (four) times daily as needed. 500 g 3  . levETIRAcetam (KEPPRA) 1000 MG tablet Take 1,000 mg by mouth 2 (two) times daily.    Marland Kitchen lidocaine (LIDODERM) 5 % Place 2 patches onto the skin daily. Remove & Discard patch within 12 hours or as directed by MD 180 patch 3  . losartan (COZAAR) 100 MG tablet Take 1 tablet (100 mg total) by mouth daily. 90 tablet 3  . Magnesium (CVS TRIPLE MAGNESIUM COMPLEX) 400 MG CAPS Take by mouth 1 day or 1 dose.    . milk thistle 175 MG tablet Take 1,000 mg by mouth daily.    . montelukast (SINGULAIR) 10 MG tablet Take 10 mg by mouth at bedtime.    . pantoprazole (PROTONIX) 40 MG tablet Take 1 tablet (40 mg total) by mouth daily. 90 tablet 3  . rOPINIRole (REQUIP) 0.25 MG tablet Take by mouth. Take 2 tablets (0.5 mg total) by mouth nightly for 30 days    . tiZANidine (ZANAFLEX) 4 MG tablet Take 1 tablet (4 mg total) by mouth at bedtime. As needed for muscle spasm. 90 tablet 0  . vitamin B-12 (CYANOCOBALAMIN) 1000 MCG tablet Take 1,000 mcg by mouth daily.    Marland Kitchen zinc gluconate 50 MG tablet Take 50 mg by mouth daily.  No current facility-administered medications for this visit.     Allergies:   Regadenoson and Milnacipran   Social History:  The patient  reports that she has quit smoking. Her smoking use included cigarettes. She has a 21.50 pack-year smoking history. She quit smokeless tobacco use about 6 years ago. She reports that she does not drink alcohol and does not use drugs.   Family History:   family history includes Alcohol abuse in her father; Asthma in her mother; Colon cancer in her maternal aunt and maternal grandfather; Coronary artery disease in her father and sister; Crohn's disease in her sister; Heart attack in her father and sister; Hyperlipidemia in her father and mother; Hypertension in her mother; Thyroid disease in  her sister; Uterine cancer in her mother.    Review of Systems: ROS   PHYSICAL EXAM: VS:  There were no vitals taken for this visit. , BMI There is no height or weight on file to calculate BMI. GEN: Well nourished, well developed, in no acute distress HEENT: normal Neck: no JVD, carotid bruits, or masses Cardiac: RRR; no murmurs, rubs, or gallops,no edema  Respiratory:  clear to auscultation bilaterally, normal work of breathing GI: soft, nontender, nondistended, + BS MS: no deformity or atrophy Skin: warm and dry, no rash Neuro:  Strength and sensation are intact Psych: euthymic mood, full affect    Recent Labs: No results found for requested labs within last 8760 hours.    Lipid Panel Lab Results  Component Value Date   CHOL 269 (H) 10/05/2012   HDL 41.80 10/05/2012   TRIG 273.0 (H) 10/05/2012      Wt Readings from Last 3 Encounters:  11/30/19 183 lb 4 oz (83.1 kg)  09/14/19 191 lb 4 oz (86.8 kg)  01/24/15 182 lb (82.6 kg)       ASSESSMENT AND PLAN:  Problem List Items Addressed This Visit    None       Disposition:   F/U  12 months   Total encounter time more than 25 minutes  Greater than 50% was spent in counseling and coordination of care with the patient    Signed, Esmond Plants, M.D., Ph.D. [place], [place]

## 2020-01-03 ENCOUNTER — Ambulatory Visit: Payer: Medicare Other | Admitting: Cardiovascular Disease

## 2020-01-03 DIAGNOSIS — I700 Atherosclerosis of aorta: Secondary | ICD-10-CM

## 2020-01-03 DIAGNOSIS — I739 Peripheral vascular disease, unspecified: Secondary | ICD-10-CM

## 2020-01-03 DIAGNOSIS — J439 Emphysema, unspecified: Secondary | ICD-10-CM

## 2020-01-03 DIAGNOSIS — R0602 Shortness of breath: Secondary | ICD-10-CM

## 2020-01-03 DIAGNOSIS — F17200 Nicotine dependence, unspecified, uncomplicated: Secondary | ICD-10-CM

## 2020-01-03 DIAGNOSIS — E782 Mixed hyperlipidemia: Secondary | ICD-10-CM

## 2020-01-05 ENCOUNTER — Other Ambulatory Visit: Payer: Self-pay

## 2020-01-05 ENCOUNTER — Ambulatory Visit (INDEPENDENT_AMBULATORY_CARE_PROVIDER_SITE_OTHER): Payer: Medicare Other | Admitting: Cardiovascular Disease

## 2020-01-05 ENCOUNTER — Encounter: Payer: Self-pay | Admitting: Cardiovascular Disease

## 2020-01-05 VITALS — BP 122/74 | HR 87 | Ht 63.0 in | Wt 183.0 lb

## 2020-01-05 DIAGNOSIS — I739 Peripheral vascular disease, unspecified: Secondary | ICD-10-CM

## 2020-01-05 DIAGNOSIS — I2584 Coronary atherosclerosis due to calcified coronary lesion: Secondary | ICD-10-CM

## 2020-01-05 DIAGNOSIS — I2510 Atherosclerotic heart disease of native coronary artery without angina pectoris: Secondary | ICD-10-CM

## 2020-01-05 DIAGNOSIS — E782 Mixed hyperlipidemia: Secondary | ICD-10-CM | POA: Diagnosis not present

## 2020-01-05 DIAGNOSIS — F17200 Nicotine dependence, unspecified, uncomplicated: Secondary | ICD-10-CM | POA: Diagnosis not present

## 2020-01-05 DIAGNOSIS — G40909 Epilepsy, unspecified, not intractable, without status epilepticus: Secondary | ICD-10-CM

## 2020-01-05 DIAGNOSIS — T466X5A Adverse effect of antihyperlipidemic and antiarteriosclerotic drugs, initial encounter: Secondary | ICD-10-CM

## 2020-01-05 DIAGNOSIS — M791 Myalgia, unspecified site: Secondary | ICD-10-CM

## 2020-01-05 MED ORDER — EZETIMIBE 10 MG PO TABS: 10.0000 mg | ORAL_TABLET | Freq: Every day | ORAL | 3 refills | Status: DC

## 2020-01-05 NOTE — Patient Instructions (Addendum)
Medication Instructions:  Your physician has recommended you make the following change in your medication:  1. START Ezetimibe (Zetia) 10 mg once daily  If you need a refill on your cardiac medications before your next appointment, please call your pharmacy.    Lab work: No new labs needed   If you have labs (blood work) drawn today and your tests are completely normal, you will receive your results only by: Marland Kitchen MyChart Message (if you have MyChart) OR . A paper copy in the mail If you have any lab test that is abnormal or we need to change your treatment, we will call you to review the results.   Testing/Procedures: Carotid u/s for known PAD Your physician has requested that you have a carotid duplex. This test is an ultrasound of the carotid arteries in your neck. It looks at blood flow through these arteries that supply the brain with blood. Allow one hour for this exam. There are no restrictions or special instructions.     Follow-Up: At [facility], you and your health needs are our priority.  As part of our continuing mission to provide you with exceptional heart care, we have created designated Provider Care Teams.  These Care Teams include your primary Cardiologist (physician) and Advanced Practice Providers (APPs -  Physician Assistants and Nurse Practitioners) who all work together to provide you with the care you need, when you need it.  . You will need a follow up appointment in 12 months  . Providers on your designated Care Team:   . Murray Hodgkins, NP . Christell Faith, PA-C . Marrianne Mood, PA-C  Any Other Special Instructions Will Be Listed Below (If Applicable).  COVID-19 Vaccine Information can be found at: ShippingScam.co.uk For questions related to vaccine distribution or appointments, please email vaccine@[email or web address] or call [phone].

## 2020-01-05 NOTE — Progress Notes (Signed)
Cardiology Office Note  Date:  01/05/2020   ID:  Lindsay Morse, DOB 10/08/1955, MRN [id number]  PCP:  Gladstone Lighter, MD   Chief Complaint  Patient presents with  . New Patient (Initial Visit)    Referred by Waunita Schooner, MD for carotid artery calcification and essential HTN   Pt states no Sx, just needs to establish care.    HPI:  Lindsay Morse is a 64 year old woman with past medical history of HTN PAD, carotid artery calcification Seizure d/o diffuse disc bulges at various levels ,  moderate central canal stenosis CAD, 30% disease Leg edema Emphysema/asthma Prior neck surgery Borderline diabetes before 40 pound weight loss Previously seen by cardiology in [place] Who presents by referral from Dr. Elwin Mocha. [name] for consultation of her PAD  To live in [place] then moved to [place] for several years now moved back to [place] to smoke 1/2 pack/day Reports that she previously quit for 4 years but restarted after loss of family member  Concerned about prior diagnosis of carotid stenosis Carotid, last u/s 2015, mild, 39%  Echo 2015 Results reviewed with her showing normal ejection fraction no significant valvular heart disease, normal pressures  Smoking 1/2 PPD Quit for 4 years  Reports that she has lost 40 pounds recently, changed her diet   EKG personally reviewed by myself on todays visit NSR rate 87 no significant ST or T wave changes   PMH:   has a past medical history of Anxiety, Carotid arterial disease ([place]), COPD (chronic obstructive pulmonary disease) ([place]), Depression, Depression, Fibromyalgia, GERD (gastroesophageal reflux disease) (05/28/2013), HTN (hypertension), Hyperlipidemia, Irritable bowel syndrome, OA (osteoarthritis), Personal history of colonic polyps, Suicidal behavior, and Syncope and collapse.  PSH:    Past Surgical History:  Procedure Laterality Date  . BREAST SURGERY  2019   benign nodule removed  .  CARPAL TUNNEL RELEASE  2007   right  . CERVICAL SPINE SURGERY  2016  . CHOLECYSTECTOMY     Dr. Emilio Math  . COLONOSCOPY  2008   repeat every 5 years,polyps- [place], [place] (03/29/2005-Hyperplastic polyp)  . TUBAL LIGATION  1978  . UPPER GASTROINTESTINAL ENDOSCOPY  6/11   duodenitis,maloney dilated    Current Outpatient Medications  Medication Sig Dispense Refill  . albuterol (PROVENTIL HFA;VENTOLIN HFA) 108 (90 BASE) MCG/ACT inhaler Inhale 2 puffs into the lungs every 6 (six) hours as needed for wheezing. 3 Inhaler 1  . Ascorbic Acid (VITAMIN C) 100 MG tablet Take 100 mg by mouth daily.    Marland Kitchen aspirin 81 MG tablet Take 81 mg by mouth daily.    . budesonide-formoterol (SYMBICORT) 160-4.5 MCG/ACT inhaler Inhale 2 puffs into the lungs 2 (two) times daily. 3 Inhaler 3  . bumetanide (BUMEX) 1 MG tablet Take 1 mg by mouth daily.    . calcium-vitamin D (OSCAL WITH D) 500-200 MG-UNIT tablet Take 1 tablet by mouth.    . carvedilol (COREG) 12.5 MG tablet Take 12.5 mg by mouth 2 (two) times daily with a meal.    . co-enzyme Q-10 30 MG capsule Take 100 mg by mouth daily.    . diclofenac sodium (VOLTAREN) 1 % GEL Apply 4 g topically 4 (four) times daily as needed. 500 g 3  . levETIRAcetam (KEPPRA) 1000 MG tablet Take 1,000 mg by mouth 2 (two) times daily.    Marland Kitchen lidocaine (LIDODERM) 5 % Place 2 patches onto the skin daily. Remove & Discard patch within 12 hours or as directed by MD 180  patch 3  . losartan (COZAAR) 100 MG tablet Take 1 tablet (100 mg total) by mouth daily. 90 tablet 3  . Magnesium (CVS TRIPLE MAGNESIUM COMPLEX) 400 MG CAPS Take by mouth 1 day or 1 dose.    . milk thistle 175 MG tablet Take 1,000 mg by mouth daily.    . montelukast (SINGULAIR) 10 MG tablet Take 10 mg by mouth at bedtime.    . pantoprazole (PROTONIX) 40 MG tablet Take 1 tablet (40 mg total) by mouth daily. 90 tablet 3  . tiZANidine (ZANAFLEX) 4 MG tablet Take 1 tablet (4 mg total) by mouth at bedtime. As needed for muscle  spasm. 90 tablet 0  . vitamin B-12 (CYANOCOBALAMIN) 1000 MCG tablet Take 1,000 mcg by mouth daily.    Marland Kitchen zinc gluconate 50 MG tablet Take 50 mg by mouth daily.    Marland Kitchen ezetimibe (ZETIA) 10 MG tablet Take 1 tablet (10 mg total) by mouth daily. 90 tablet 3  . rOPINIRole (REQUIP) 0.25 MG tablet Take by mouth. Take 2 tablets (0.5 mg total) by mouth nightly for 30 days     No current facility-administered medications for this visit.     Allergies:   Regadenoson and Milnacipran   Social History:  The patient  reports that she has quit smoking. Her smoking use included cigarettes. She has a 21.50 pack-year smoking history. She quit smokeless tobacco use about 6 years ago. She reports that she does not drink alcohol and does not use drugs.   Family History:   family history includes Alcohol abuse in her father; Asthma in her mother; Colon cancer in her maternal aunt and maternal grandfather; Coronary artery disease in her father and sister; Crohn's disease in her sister; Heart attack in her father and sister; Hyperlipidemia in her father and mother; Hypertension in her mother; Thyroid disease in her sister; Uterine cancer in her mother.    Review of Systems: Review of Systems  Constitutional: Negative.   HENT: Negative.   Respiratory: Negative.   Cardiovascular: Negative.   Gastrointestinal: Negative.   Musculoskeletal: Negative.   Neurological: Negative.   Psychiatric/Behavioral: Negative.   All other systems reviewed and are negative.    PHYSICAL EXAM: VS:  BP 122/74   Pulse 87   Ht 5\' 3"  (1.6 m)   Wt 183 lb (83 kg)   BMI 32.42 kg/m  , BMI Body mass index is 32.42 kg/m. GEN: Well nourished, well developed, in no acute distress HEENT: normal Neck: no JVD, carotid bruits, or masses Cardiac: RRR; no murmurs, rubs, or gallops,no edema  Respiratory:  clear to auscultation bilaterally, normal work of breathing GI: soft, nontender, nondistended, + BS MS: no deformity or atrophy Skin:  warm and dry, no rash Neuro:  Strength and sensation are intact Psych: euthymic mood, full affect  Recent Labs: No results found for requested labs within last 8760 hours.    Lipid Panel Lab Results  Component Value Date   CHOL 269 (H) 10/05/2012   HDL 41.80 10/05/2012   TRIG 273.0 (H) 10/05/2012      Wt Readings from Last 3 Encounters:  01/05/20 183 lb (83 kg)  11/30/19 183 lb 4 oz (83.1 kg)  09/14/19 191 lb 4 oz (86.8 kg)       ASSESSMENT AND PLAN:  Problem List Items Addressed This Visit      Cardiology Problems   PAD (peripheral artery disease) ([place]) - Primary   Relevant Medications   ezetimibe (ZETIA) 10 MG tablet  Other Relevant Orders   EKG 12-Lead   VAS US CAROTID   Hyperlipidemia   Relevant Medications   ezetimibe (ZETIA) 10 MG tablet     Other   Seizure disorder ([place])    Other Visit Diagnoses    Smoker       Coronary artery calcification       Relevant Medications   ezetimibe (ZETIA) 10 MG tablet   Myalgia due to statin         PAD/carotid disease Mild disease noted back in 2015 We have ordered repeat carotid ultrasound  No bruits appreciated on exam  Hyperlipidemia Recommend she start Zetia 10 mg daily She is does not want a statin secondary to history of statin myalgias on Crestor, Lipitor -She will recheck her lipids in several months time with primary care  Smoker We have encouraged her to continue to work on weaning her cigarettes and smoking cessation. She will continue to work on this and does not want any assistance with chantix.   Essential hypertension Blood pressure is well controlled on today's visit. No changes made to the medications.    Total encounter time more than 60 minutes  Greater than 50% was spent in counseling and coordination of care with the patient  Seen in consultation for Dr.Kalisetti/Dr. [name] and will be referred back to her office for ongoing care of the issues detailed above  Signed, Esmond Plants, M.D.,  Ph.D. [place], [place]

## 2020-01-07 ENCOUNTER — Ambulatory Visit
Admission: RE | Admit: 2020-01-07 | Discharge: 2020-01-07 | Disposition: A | Discharge status: Home / Self Care | Payer: Medicare Other | Source: Ambulatory Visit | Attending: Internal Medicine | Admitting: Internal Medicine

## 2020-01-07 ENCOUNTER — Other Ambulatory Visit: Payer: Self-pay

## 2020-01-07 DIAGNOSIS — M48061 Spinal stenosis, lumbar region without neurogenic claudication: Secondary | ICD-10-CM

## 2020-01-17 ENCOUNTER — Other Ambulatory Visit: Payer: Self-pay | Admitting: Cardiovascular Disease

## 2020-01-17 DIAGNOSIS — I6523 Occlusion and stenosis of bilateral carotid arteries: Secondary | ICD-10-CM

## 2020-01-27 ENCOUNTER — Ambulatory Visit (INDEPENDENT_AMBULATORY_CARE_PROVIDER_SITE_OTHER): Payer: Medicare Other | Admitting: Pulmonary Disease

## 2020-01-27 ENCOUNTER — Encounter: Payer: Self-pay | Admitting: Pulmonary Disease

## 2020-01-27 ENCOUNTER — Other Ambulatory Visit: Payer: Self-pay

## 2020-01-27 VITALS — BP 98/58 | HR 75 | Temp 97.7°F | Ht 64.0 in | Wt 183.4 lb

## 2020-01-27 DIAGNOSIS — R911 Solitary pulmonary nodule: Secondary | ICD-10-CM | POA: Diagnosis not present

## 2020-01-27 DIAGNOSIS — J449 Chronic obstructive pulmonary disease, unspecified: Secondary | ICD-10-CM | POA: Diagnosis not present

## 2020-01-27 DIAGNOSIS — K219 Gastro-esophageal reflux disease without esophagitis: Secondary | ICD-10-CM | POA: Diagnosis not present

## 2020-01-27 DIAGNOSIS — F17210 Nicotine dependence, cigarettes, uncomplicated: Secondary | ICD-10-CM

## 2020-01-27 MED ORDER — PANTOPRAZOLE SODIUM 40 MG PO TBEC
40.0000 mg | DELAYED_RELEASE_TABLET | Freq: Every day | ORAL | 2 refills | Status: DC
Start: 2020-01-27 — End: 2020-01-27

## 2020-01-27 MED ORDER — PANTOPRAZOLE SODIUM 40 MG PO TBEC: 40.0000 mg | DELAYED_RELEASE_TABLET | Freq: Every day | ORAL | 2 refills | Status: AC

## 2020-01-27 NOTE — Progress Notes (Signed)
Subjective:    Patient ID: Lindsay Morse, female    DOB: 1955/05/15, 64 y.o.   MRN: [id number]  HPI The patient is a 64 year old current smoker (half PPD) who presents for evaluation of pulmonary emphysema and a lung nodule.  She is kindly referred by Dr. Gladstone Lighter.  The patient has recently relocated in this area.  Previously had been evaluated in 2014 by our colleague Dr. Simonne Maffucci.  At that time she was being evaluated for cough and presumptive COPD.  PFTs performed January 2014 showed that she had an FEV1 of 1.98 L or 83% predicted, FVC of 2.59 L or 81% predicted with an FEV1/FVC ratio of 76%.  She did not have bronchodilator response.  Study was limited to spirometry and postbronchodilator.  She had relocated to [place] and while there she was told she had a lung nodule which was noted to be a calcified granuloma of the left upper lobe.  Most recent lung cancer screening study was performed January 2021.  She is now back in this area and wants to reestablish with pulmonary.  She has had issues with shortness of breath for approximately 10 years worse over the last year.  She unfortunately continues to smoke as noted above however is precontemplative with regards to discontinuation of smoking.  She has had cough productive of yellowish sputum.  She has been on Symbicort which she uses religiously every day and uses albuterol rescue rarely.  She does have significant gastroesophageal reflux symptoms.  She voices no other complaint today.  No weight loss or anorexia.  She is a retired Biochemist, clinical.  No exotic pets though she has 2 dogs in the home (sheltie and [place]).  She has resided in [place], [place], [place] and [place].  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    PFTs obtained most recently: Pulmonary Function Test (PFT) Latest Ref Rng & Units 11/23/2014 12/20/2015  FVC PRE L  2.49 2.27  FVC % PRE PRED % 81.3 77.1  FEV1 PRE L 1.88 1.75  FEV1 % PRE PRED % 76.7 74.1  FEV1/FVC PRE % 75.46 76.94  TLC PRE L 4.67 -  TLC % PRE PRED % 94.1 -  RV PRE L 2.16 -  RV % PRE PRED % 112.7 -  DLCO PRE ml/(min*mmHg) 13.68 12.96  DLCO % PRE PRED % 72.1 69.7   These obtained at [facility].   Review of Systems A 10 point review of systems was performed and it is as noted above otherwise negative.  Past Medical History:  Diagnosis Date  . Anxiety   . Carotid arterial disease ([place])    a. 06/2012 Carotid U/S: 40-50% bilat ICA stenosis, f/u 1 yr.  Marland Kitchen COPD (chronic obstructive pulmonary disease) ([place])   . Depression   . Depression   . Fibromyalgia   . GERD (gastroesophageal reflux disease) 05/28/2013  . HTN (hypertension)    now off all meds  . Hyperlipidemia   . Irritable bowel syndrome   . OA (osteoarthritis)    hips, back  . Personal history of colonic polyps   . Suicidal behavior    >20 years ago  . Syncope and collapse    a. 01/2009 Echo: EF 55-60%, mildly dil LA.   Patient Active Problem List   Diagnosis Date Noted  . PAD (peripheral artery disease) ([place]) 01/01/2020  . Aortic atherosclerosis ([place]) 01/01/2020  .  Anxiety 09/14/2019  . Lung nodule 09/14/2019  . Seborrheic keratoses 09/14/2019  . Acute pain of right knee 09/14/2019  . Hypertension 10/07/2018  . Seizure disorder ([place]) 02/18/2014  . Syncope 10/14/2013  . SOB (shortness of breath) 10/14/2013  . Aortic calcification, 04/07/2012 CT Chest 05/28/2013  . GERD (gastroesophageal reflux disease) 05/28/2013  . Carotid artery calcification 05/28/2013  . Obesity (BMI 30-39.9) 05/28/2013  . Herniation of cervical intervertebral disc with radiculopathy 05/28/2013  . Hemoptysis 03/25/2012  . COPD with emphysema ([place]) 06/03/2011  . COLONIC POLYPS, HX OF 08/30/2009  . IBS 06/26/2009  . TOBACCO ABUSE 04/07/2009  . SLEEP DISORDER 09/26/2008  . Hyperlipidemia 08/19/2008  . Major depressive disorder, recurrent  episode, in partial remission ([place]) 08/09/2008  . Fibromyalgia 11/18/2006  . Restless leg syndrome 11/18/2006   Past Surgical History:  Procedure Laterality Date  . BREAST SURGERY  2019   benign nodule removed  . CARPAL TUNNEL RELEASE  2007   right  . CERVICAL SPINE SURGERY  2016  . CHOLECYSTECTOMY     Dr. Emilio Math  . COLONOSCOPY  2008   repeat every 5 years,polyps- [place], [place] (03/29/2005-Hyperplastic polyp)  . TUBAL LIGATION  1978  . UPPER GASTROINTESTINAL ENDOSCOPY  6/11   duodenitis,maloney dilated   Social History   Tobacco Use  . Smoking status: Current Every Day Smoker    Packs/day: 1.50    Years: 43.00    Pack years: 64.50    Types: Cigarettes  . Smokeless tobacco: Never Used  . Tobacco comment: 0.5 ppd/ 01/27/2020  Substance Use Topics  . Alcohol use: No    Alcohol/week: 1.0 - 2.0 standard drink    Types: 1 - 2 Glasses of wine per week   Allergies  Allergen Reactions  . Regadenoson Other (See Comments)    Trouble breathing  . Milnacipran     REACTION: tachycardia   Current Meds  Medication Sig  . Ascorbic Acid (VITAMIN C) 100 MG tablet Take 100 mg by mouth daily.  Marland Kitchen aspirin 81 MG tablet Take 81 mg by mouth daily.  . budesonide-formoterol (SYMBICORT) 160-4.5 MCG/ACT inhaler Inhale 2 puffs into the lungs 2 (two) times daily.  . calcium-vitamin D (OSCAL WITH D) 500-200 MG-UNIT tablet Take 1 tablet by mouth.  . carvedilol (COREG) 12.5 MG tablet Take 12.5 mg by mouth 2 (two) times daily with a meal.  . co-enzyme Q-10 30 MG capsule Take 100 mg by mouth daily.  . diclofenac sodium (VOLTAREN) 1 % GEL Apply 4 g topically 4 (four) times daily as needed.  . ezetimibe (ZETIA) 10 MG tablet Take 1 tablet (10 mg total) by mouth daily.  Marland Kitchen levETIRAcetam (KEPPRA) 1000 MG tablet Take 1,000 mg by mouth 2 (two) times daily.  Marland Kitchen lidocaine (LIDODERM) 5 % Place 2 patches onto the skin daily. Remove & Discard patch within 12 hours or as directed by MD  . losartan (COZAAR) 100 MG  tablet Take 1 tablet (100 mg total) by mouth daily.  . Magnesium (CVS TRIPLE MAGNESIUM COMPLEX) 400 MG CAPS Take by mouth 1 day or 1 dose.  . milk thistle 175 MG tablet Take 1,000 mg by mouth daily.  . montelukast (SINGULAIR) 10 MG tablet Take 10 mg by mouth at bedtime.  Marland Kitchen rOPINIRole (REQUIP) 0.25 MG tablet Take by mouth. Take 2 tablets (0.5 mg total) by mouth nightly for 30 days  . tiZANidine (ZANAFLEX) 4 MG tablet Take 1 tablet (4 mg total) by mouth at bedtime. As needed for muscle spasm.  Marland Kitchen  vitamin B-12 (CYANOCOBALAMIN) 1000 MCG tablet Take 1,000 mcg by mouth daily.  Marland Kitchen zinc gluconate 50 MG tablet Take 50 mg by mouth daily.   Immunization History  Administered Date(s) Administered  . Influenza Split 12/23/2013  . Influenza Whole 01/30/2010  . Influenza, Seasonal, Injecte, Preservative Fre 03/30/2012  . Influenza-Unspecified 12/17/2019  . Td 03/18/2006      Objective:   Physical Exam BP (!) 98/58 (BP Location: Left Arm, Patient Position: Sitting, Cuff Size: Normal)   Pulse 75   Temp 97.7 F (36.5 C) (Temporal)   Ht 5\' 4"  (1.626 m)   Wt 183 lb 6.4 oz (83.2 kg)   SpO2 97%   BMI 31.48 kg/m  GENERAL: Well-developed, overweight woman, no acute distress.  Fully ambulatory. HEAD: Normocephalic, atraumatic.  EYES: Pupils equal, round, reactive to light.  No scleral icterus.  MOUTH: Nose/mouth/throat not examined due to masking requirements for COVID 19. NECK: Supple. No thyromegaly. Trachea midline. No JVD.  No adenopathy. PULMONARY: Good air entry bilaterally.  No adventitious sounds. CARDIOVASCULAR: S1 and S2. Regular rate and rhythm.  No rubs, murmurs or gallops heard. ABDOMEN: Benign. MUSCULOSKELETAL: No joint deformity, no clubbing, no edema.  NEUROLOGIC: No focal deficit, no gait disturbance, speech is fluent. SKIN: Intact,warm,dry.  On limited exam, no rashes. PSYCH: Mood and behavior normal.       Assessment & Plan:     ICD-10-CM   1. COPD suggested by initial  evaluation [facility])  J44.9 Pulmonary Function Test [facility] Only   Need to reassess with PFTs Continue Symbicort for now Continue as needed albuterol  2. Chronic GERD  K21.9    Protonix renewed for the patient Further refills per primary care  3. Lung nodule  R91.1    Left upper lobe granuloma Nothing further to work-up Enroll in lung cancer screening  4. Tobacco dependence due to cigarettes  F17.210    Patient counseled regards to discontinuation of smoking Patient will be enrolled in lung cancer screening program    Orders Placed This Encounter  Procedures  . Pulmonary Function Test [facility] Only    Next available    Standing Status:   Future    Standing Expiration Date:   01/26/2021    Order Specific Question:   Full PFT: includes the following: basic spirometry, spirometry pre & post bronchodilator, diffusion capacity (DLCO), lung volumes    Answer:   Full PFT   Meds ordered this encounter  Medications  . DISCONTD: pantoprazole (PROTONIX) 40 MG tablet    Sig: Take 1 tablet (40 mg total) by mouth daily.    Dispense:  30 tablet    Refill:  2  . pantoprazole (PROTONIX) 40 MG tablet    Sig: Take 1 tablet (40 mg total) by mouth daily.    Dispense:  30 tablet    Refill:  2   The patient likely has significant COPD.  We will proceed with obtaining pulmonary function test.  We will also refer her to the lung cancer screening program.  She has significant gastroesophageal reflux symptoms and we will refill her Protonix until she is able to get follow-up with her primary.  We will see her in follow-up in 4 months time she is to contact us prior to that time should any new difficulties arise.  Renold Don, MD [facility]   *This note was dictated using voice recognition software/Dragon.  Despite best efforts to proofread, errors can occur which can change the meaning.  Any change was purely  unintentional. 

## 2020-01-27 NOTE — Patient Instructions (Signed)
I have refilled your Protonix  We are scheduling breathing tests  We are referring you to the lung cancer screening program  We will see you in follow-up in 4 months time call sooner should any new difficulties arise.

## 2020-01-31 ENCOUNTER — Other Ambulatory Visit: Payer: Self-pay | Admitting: Cardiovascular Disease

## 2020-01-31 ENCOUNTER — Other Ambulatory Visit: Payer: Self-pay

## 2020-01-31 ENCOUNTER — Ambulatory Visit (INDEPENDENT_AMBULATORY_CARE_PROVIDER_SITE_OTHER): Payer: Medicare Other

## 2020-01-31 DIAGNOSIS — I6523 Occlusion and stenosis of bilateral carotid arteries: Secondary | ICD-10-CM | POA: Diagnosis not present

## 2020-03-01 ENCOUNTER — Telehealth: Payer: Self-pay | Admitting: *Deleted

## 2020-03-01 NOTE — Telephone Encounter (Signed)
Received referral for low dose lung cancer screening CT scan. Message left at phone number listed in EMR for patient to call me back to facilitate scheduling scan.  

## 2020-06-01 ENCOUNTER — Encounter: Payer: Self-pay | Admitting: *Deleted

## 2020-06-07 ENCOUNTER — Telehealth: Payer: Self-pay | Admitting: *Deleted

## 2020-06-07 DIAGNOSIS — Z122 Encounter for screening for malignant neoplasm of respiratory organs: Secondary | ICD-10-CM

## 2020-06-07 DIAGNOSIS — Z87891 Personal history of nicotine dependence: Secondary | ICD-10-CM

## 2020-06-07 DIAGNOSIS — F17200 Nicotine dependence, unspecified, uncomplicated: Secondary | ICD-10-CM

## 2020-06-07 NOTE — Telephone Encounter (Signed)
Received referral for initial lung cancer screening scan. Contacted patient and obtained smoking history,(current, 64.5 pack year) as well as answering questions related to screening process. Patient denies signs of lung cancer such as weight loss or hemoptysis. Patient denies comorbidity that would prevent curative treatment if lung cancer were found. Patient is scheduled for shared decision making visit and CT scan on 06/27/20 at 130pm.

## 2020-06-21 ENCOUNTER — Ambulatory Visit: Payer: Medicare Other | Admitting: Pulmonary Disease

## 2020-06-27 ENCOUNTER — Inpatient Hospital Stay: Payer: Medicare Other | Attending: Nurse Practitioner | Admitting: Oncology

## 2020-06-27 ENCOUNTER — Other Ambulatory Visit: Payer: Self-pay

## 2020-06-27 ENCOUNTER — Ambulatory Visit
Admission: RE | Admit: 2020-06-27 | Discharge: 2020-06-27 | Disposition: A | Discharge status: Home / Self Care | Payer: Medicare Other | Source: Ambulatory Visit | Attending: Nurse Practitioner | Admitting: Nurse Practitioner

## 2020-06-27 DIAGNOSIS — Z87891 Personal history of nicotine dependence: Secondary | ICD-10-CM

## 2020-06-27 DIAGNOSIS — Z122 Encounter for screening for malignant neoplasm of respiratory organs: Secondary | ICD-10-CM | POA: Diagnosis present

## 2020-06-27 DIAGNOSIS — F17200 Nicotine dependence, unspecified, uncomplicated: Secondary | ICD-10-CM

## 2020-06-27 NOTE — Progress Notes (Signed)
Virtual Visit via Video Note  I connected with Lindsay Morse on 06/27/20 at  1:30 PM EDT by a video enabled telemedicine application and verified that I am speaking with the correct person using two identifiers.  Location: Patient: [facility] Provider: Clinic    I discussed the limitations of evaluation and management by telemedicine and the availability of in person appointments. The patient expressed understanding and agreed to proceed.  I discussed the assessment and treatment plan with the patient. The patient was provided an opportunity to ask questions and all were answered. The patient agreed with the plan and demonstrated an understanding of the instructions.   The patient was advised to call back or seek an in-person evaluation if the symptoms worsen or if the condition fails to improve as anticipated.   In accordance with CMS guidelines, patient has met eligibility criteria including age, absence of signs or symptoms of lung cancer.  Social History   Tobacco Use  . Smoking status: Current Every Day Smoker    Packs/day: 1.50    Years: 43.00    Pack years: 64.50    Types: Cigarettes  . Smokeless tobacco: Never Used  . Tobacco comment: 0.5 ppd/ 01/27/2020  Vaping Use  . Vaping Use: Former  Substance Use Topics  . Alcohol use: No    Alcohol/week: 1.0 - 2.0 standard drink    Types: 1 - 2 Glasses of wine per week  . Drug use: No      A shared decision-making session was conducted prior to the performance of CT scan. This includes one or more decision aids, includes benefits and harms of screening, follow-up diagnostic testing, over-diagnosis, false positive rate, and total radiation exposure.   Counseling on the importance of adherence to annual lung cancer LDCT screening, impact of co-morbidities, and ability or willingness to undergo diagnosis and treatment is imperative for compliance of the program.   Counseling on the importance of continued smoking cessation for former  smokers; the importance of smoking cessation for current smokers, and information about tobacco cessation interventions have been given to patient including [place] and 1800 quit [place] programs.   Written order for lung cancer screening with LDCT has been given to the patient and any and all questions have been answered to the best of my abilities.    Yearly follow up will be coordinated by Burgess Estelle, Thoracic Navigator.  I provided 15 minutes of face-to-face video visit time during this encounter, and > 50% was spent counseling as documented under my assessment & plan.   Jacquelin Hawking, NP

## 2020-06-29 ENCOUNTER — Telehealth: Payer: Self-pay | Admitting: *Deleted

## 2020-06-29 NOTE — Telephone Encounter (Signed)
Notified patient of LDCT lung cancer screening program results with recommendation for 12 month follow up imaging. Also notified of incidental findings noted below and is encouraged to discuss further with PCP who will receive a copy of this note and/or the CT report. Patient verbalizes understanding.   IMPRESSION: 1. Lung-RADS 2S, benign appearance or behavior. Continue annual screening with low-dose chest CT without contrast in 12 months. 2. The "S" modifier above refers to potentially clinically significant non lung cancer related findings. Specifically, there is aortic atherosclerosis, in addition to left main and 3 vessel coronary artery disease. Please note that although the presence of coronary artery calcium documents the presence of coronary artery disease, the severity of this disease and any potential stenosis cannot be assessed on this non-gated CT examination. Assessment for potential risk factor modification, dietary therapy or pharmacologic therapy may be warranted, if clinically indicated. 3. Mild diffuse bronchial wall thickening with mild centrilobular and paraseptal emphysema; imaging findings suggestive of underlying COPD.  Aortic Atherosclerosis (ICD10-I70.0) and Emphysema (ICD10-J43.9).

## 2020-06-29 NOTE — Telephone Encounter (Signed)
Noted  

## 2020-07-18 ENCOUNTER — Other Ambulatory Visit: Payer: Self-pay

## 2020-07-18 ENCOUNTER — Ambulatory Visit
Admission: RE | Admit: 2020-07-18 | Discharge: 2020-07-18 | Disposition: A | Discharge status: Home / Self Care | Payer: Medicare Other | Source: Ambulatory Visit | Attending: Internal Medicine | Admitting: Internal Medicine

## 2020-07-18 DIAGNOSIS — Z1231 Encounter for screening mammogram for malignant neoplasm of breast: Secondary | ICD-10-CM

## 2020-07-24 ENCOUNTER — Ambulatory Visit (INDEPENDENT_AMBULATORY_CARE_PROVIDER_SITE_OTHER): Payer: Medicare Other | Admitting: Pulmonary Disease

## 2020-07-24 ENCOUNTER — Other Ambulatory Visit: Payer: Self-pay

## 2020-07-24 ENCOUNTER — Encounter: Payer: Self-pay | Admitting: Pulmonary Disease

## 2020-07-24 VITALS — BP 112/70 | HR 86 | Temp 97.3°F | Ht 61.1 in | Wt 190.6 lb

## 2020-07-24 DIAGNOSIS — R0602 Shortness of breath: Secondary | ICD-10-CM

## 2020-07-24 DIAGNOSIS — J449 Chronic obstructive pulmonary disease, unspecified: Secondary | ICD-10-CM | POA: Diagnosis not present

## 2020-07-24 DIAGNOSIS — F17210 Nicotine dependence, cigarettes, uncomplicated: Secondary | ICD-10-CM | POA: Diagnosis not present

## 2020-07-24 MED ORDER — BREZTRI AEROSPHERE 160-9-4.8 MCG/ACT IN AERO: 160.0000 ug | INHALATION_SPRAY | Freq: Two times a day (BID) | RESPIRATORY_TRACT | 0 refills | Status: DC

## 2020-07-24 NOTE — Progress Notes (Signed)
Subjective:    Patient ID: Lindsay Morse, female    DOB: [birth year], 65 y.o.   MRN: [id number]  HPI Lindsay Morse is a 65 year old current smoker (half PPD) who presents for follow-up on the issue of cough and COPD.  We last saw her in November 2021.  At that time PFTs were ordered however she never had these done because she has been trying to avoid exposure to COVID-19.  In addition she has been reluctant to get COVID testing.  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    She unfortunately continues to smoke half a pack of cigarettes per day.  She has been on Symbicort 160/4.5, 2 inhalations twice a day.  She has not been on a LAMA.  She notes persistent issues with shortness of breath and cough.  Cough is mostly nonproductive.  She has not had any fevers, chills or sweats.  No hemoptysis.  No chest pain.  No anorexia or weight loss.  She is enrolled in low-dose lung cancer screening most recently performed 14 April, no suspicious lesions noted.  PFTs obtained in 2017 at [facility]: Pulmonary Function Test (PFT) Latest Ref Rng & Units 11/23/2014 12/20/2015  FVC PRE L 2.49 2.27  FVC % PRE PRED % 81.3 77.1  FEV1 PRE L 1.88 1.75  FEV1 % PRE PRED % 76.7 74.1  FEV1/FVC PRE % 75.46 76.94  TLC PRE L 4.67 -  TLC % PRE PRED % 94.1 -  RV PRE L 2.16 -  RV % PRE PRED % 112.7 -  DLCO PRE ml/(min*mmHg) 13.68 12.96  DLCO % PRE PRED % 72.1 69.7     Review of Systems A 10 point review of systems was performed and it is as noted above otherwise negative.  Patient Active Problem List   Diagnosis Date Noted  . PAD (peripheral artery disease) ([place]) 01/01/2020  . Aortic atherosclerosis ([place]) 01/01/2020  . Anxiety 09/14/2019  . Lung nodule 09/14/2019  . Seborrheic keratoses 09/14/2019  . Acute pain of right knee 09/14/2019  . Hypertension 10/07/2018  . Seizure disorder ([place]) 02/18/2014  . Syncope 10/14/2013  . SOB (shortness of breath)  10/14/2013  . Aortic calcification, 04/07/2012 CT Chest 05/28/2013  . GERD (gastroesophageal reflux disease) 05/28/2013  . Carotid artery calcification 05/28/2013  . Obesity (BMI 30-39.9) 05/28/2013  . Herniation of cervical intervertebral disc with radiculopathy 05/28/2013  . Hemoptysis 03/25/2012  . COPD with emphysema ([place]) 06/03/2011  . COLONIC POLYPS, HX OF 08/30/2009  . IBS 06/26/2009  . TOBACCO ABUSE 04/07/2009  . SLEEP DISORDER 09/26/2008  . Hyperlipidemia 08/19/2008  . Major depressive disorder, recurrent episode, in partial remission ([place]) 08/09/2008  . Fibromyalgia 11/18/2006  . Restless leg syndrome 11/18/2006   Social History   Tobacco Use  . Smoking status: Current Every Day Smoker    Packs/day: 0.50    Years: 43.00    Pack years: 21.50    Types: Cigarettes  . Smokeless tobacco: Never Used  . Tobacco comment: 07/24/2020 .5 pack.  Substance Use Topics  . Alcohol use: No    Alcohol/week: 1.0 - 2.0 standard drink    Types: 1 - 2 Glasses of wine per week   Allergies  Allergen Reactions  . Regadenoson Other (See Comments)    Trouble breathing  . Milnacipran     REACTION: tachycardia   Current Meds  Medication Sig  . albuterol (PROVENTIL HFA;VENTOLIN HFA) 108 (90  BASE) MCG/ACT inhaler Inhale 2 puffs into the lungs every 6 (six) hours as needed for wheezing.  . Ascorbic Acid (VITAMIN C) 100 MG tablet Take 100 mg by mouth daily.  Marland Kitchen aspirin 81 MG tablet Take 81 mg by mouth daily.  . budesonide-formoterol (SYMBICORT) 160-4.5 MCG/ACT inhaler Inhale 2 puffs into the lungs 2 (two) times daily.  . bumetanide (BUMEX) 1 MG tablet Take 1 mg by mouth daily.  . calcium-vitamin D (OSCAL WITH D) 500-200 MG-UNIT tablet Take 1 tablet by mouth.  . carvedilol (COREG) 12.5 MG tablet Take 12.5 mg by mouth 2 (two) times daily with a meal.  . co-enzyme Q-10 30 MG capsule Take 100 mg by mouth daily.  . diclofenac sodium (VOLTAREN) 1 % GEL Apply 4 g topically 4 (four) times daily as  needed.  . diclofenac Sodium (VOLTAREN) 1 % GEL Apply topically.  Marland Kitchen ezetimibe (ZETIA) 10 MG tablet Take 1 tablet (10 mg total) by mouth daily.  Marland Kitchen ezetimibe (ZETIA) 10 MG tablet Take 1 tablet by mouth daily.  Marland Kitchen levETIRAcetam (KEPPRA) 1000 MG tablet Take 1,000 mg by mouth 2 (two) times daily.  Marland Kitchen lidocaine (LIDODERM) 5 % Place 2 patches onto the skin daily. Remove & Discard patch within 12 hours or as directed by MD  . losartan (COZAAR) 100 MG tablet Take 1 tablet (100 mg total) by mouth daily.  . Magnesium ([employer] TRIPLE MAGNESIUM COMPLEX) 400 MG CAPS Take by mouth 1 day or 1 dose.  . milk thistle 175 MG tablet Take 1,000 mg by mouth daily.  . montelukast (SINGULAIR) 10 MG tablet Take 10 mg by mouth at bedtime.  . pantoprazole (PROTONIX) 40 MG tablet Take 1 tablet (40 mg total) by mouth daily.  Marland Kitchen rOPINIRole (REQUIP) 0.5 MG tablet Take by mouth.  Marland Kitchen tiZANidine (ZANAFLEX) 4 MG tablet Take 1 tablet (4 mg total) by mouth at bedtime. As needed for muscle spasm.  Marland Kitchen tiZANidine (ZANAFLEX) 4 MG tablet Take by mouth.  . vitamin B-12 (CYANOCOBALAMIN) 1000 MCG tablet Take 1,000 mcg by mouth daily.  Marland Kitchen zinc gluconate 50 MG tablet Take 50 mg by mouth daily.    Immunization History  Administered Date(s) Administered  . Influenza Split 12/23/2013  . Influenza Whole 01/30/2010  . Influenza, Seasonal, Injecte, Preservative Fre 03/30/2012  . Influenza-Unspecified 12/17/2019  . Pneumococcal-Unspecified 12/23/2019  . Td 03/18/2006  . Tdap 04/05/2020   We discussed Covid-19 precautions.  I reviewed the vaccine effectiveness and potential side effects in detail to include differences between mRNA vaccines and traditional vaccines (attenuated virus).  Discussion also offered of long-term effectiveness and safety profile which are unclear at this time.  Discussed current CDC guidance that all patients are recommended COVID-19 vaccinations -as long as they do not have allergy to components of the vaccine.    Objective:    Physical Exam BP 112/70 (BP Location: Left Arm, Patient Position: Sitting, Cuff Size: Normal)   Pulse 86   Temp (!) 97.3 F (36.3 C) (Temporal)   Ht 5' 1.1" (1.552 m)   Wt 190 lb 9.6 oz (86.5 kg)   SpO2 95%   BMI 35.90 kg/m   GENERAL: Well-developed, overweight woman, no acute distress.  Fully ambulatory. HEAD: Normocephalic, atraumatic.  EYES: Pupils equal, round, reactive to light.  No scleral icterus.  MOUTH: Nose/mouth/throat not examined due to masking requirements for COVID 19. NECK: Supple. No thyromegaly. Trachea midline. No JVD.  No adenopathy. PULMONARY: Good air entry bilaterally.  Coarse otherwise, no adventitious sounds. CARDIOVASCULAR:  S1 and S2. Regular rate and rhythm.  No rubs, murmurs or gallops heard. ABDOMEN: Benign. MUSCULOSKELETAL: No joint deformity, no clubbing, no edema.  NEUROLOGIC: No focal deficit, no gait disturbance, speech is fluent. SKIN: Intact,warm,dry.  On limited exam, no rashes. PSYCH: Mood and behavior normal.      Assessment & Plan:     ICD-10-CM   1. Chronic obstructive pulmonary disease, unspecified COPD type ([place])  J44.9    Continue to recommend PFTs Cannot quantitate COPD without PFTs Breztri 2 puffs twice a day Stop Symbicort  2. SOB (shortness of breath)  R06.02    Trial of Breztri as above  3. Tobacco dependence due to cigarettes  F17.210    Patient was counseled regards to discontinuation of smoking Total counseling time 3 to 5 minutes   Meds ordered this encounter  Medications  . Budeson-Glycopyrrol-Formoterol (BREZTRI AEROSPHERE) 160-9-4.8 MCG/ACT AERO    Sig: Inhale 160 mcg into the lungs in the morning and at bedtime.    Dispense:  5.9 g    Refill:  0    Order Specific Question:   Lot Number?    Answer:   [id number]    Order Specific Question:   Expiration Date?    Answer:   12/16/2022    Order Specific Question:   Quantity    Answer:   2   I have encouraged the patient to reconsider getting PFTs done.  I cannot  quantitate the severity of her disease without PFTs.  She understands this.  We will see her in follow-up in 3 months time she is to contact us prior to that time should any new difficulties arise.  She is to let us know how she is doing with the Judithann Sauger so we can call it into her pharmacy.  Renold Don, MD [place] [facility]   *This note was dictated using voice recognition software/Dragon.  Despite best efforts to proofread, errors can occur which can change the meaning.  Any change was purely unintentional.

## 2020-07-24 NOTE — Patient Instructions (Signed)
Please note that I cannot quantitate your COPD without a breathing test.  I am giving you a trial of Breztri 2 puffs twice a day to see if this helps with your cough and shortness of breath.  DO NOT USE SYMBICORT WHILE USING THE BREZTRI  Let us know how the Judithann Sauger does for you so that we can call it into your pharmacy.   We will see you in follow-up in 3 months time call sooner should any new problems arise.

## 2020-08-01 ENCOUNTER — Encounter: Payer: Self-pay | Admitting: Family

## 2020-08-01 ENCOUNTER — Other Ambulatory Visit: Payer: Self-pay

## 2020-08-01 ENCOUNTER — Ambulatory Visit (INDEPENDENT_AMBULATORY_CARE_PROVIDER_SITE_OTHER): Payer: Medicare Other | Admitting: Family

## 2020-08-01 VITALS — BP 110/66 | HR 90 | Ht 61.1 in | Wt 189.0 lb

## 2020-08-01 DIAGNOSIS — T466X5D Adverse effect of antihyperlipidemic and antiarteriosclerotic drugs, subsequent encounter: Secondary | ICD-10-CM

## 2020-08-01 DIAGNOSIS — M791 Myalgia, unspecified site: Secondary | ICD-10-CM | POA: Diagnosis not present

## 2020-08-01 DIAGNOSIS — I10 Essential (primary) hypertension: Secondary | ICD-10-CM | POA: Diagnosis not present

## 2020-08-01 DIAGNOSIS — Z720 Tobacco use: Secondary | ICD-10-CM | POA: Diagnosis not present

## 2020-08-01 DIAGNOSIS — I6523 Occlusion and stenosis of bilateral carotid arteries: Secondary | ICD-10-CM | POA: Diagnosis not present

## 2020-08-01 DIAGNOSIS — E782 Mixed hyperlipidemia: Secondary | ICD-10-CM

## 2020-08-01 MED ORDER — LOSARTAN POTASSIUM 50 MG PO TABS: 50.0000 mg | ORAL_TABLET | Freq: Every day | ORAL | 1 refills | Status: AC

## 2020-08-01 MED ORDER — BUPROPION HCL ER (SR) 150 MG PO TB12: ORAL_TABLET | ORAL | 2 refills | Status: DC

## 2020-08-01 NOTE — Progress Notes (Signed)
Office Visit    Patient Name: Lindsay Morse Date of Encounter: 08/01/2020  PCP:  Gladstone Lighter, MD   [facility]  Cardiologist:  Ida Rogue, MD  Advanced Practice Provider:  No care team member to display Electrophysiologist:  None   Chief Complaint    Lindsay Morse is a 65 y.o. female with a hx of hypertension, COPD, bilateral carotid artery disease, tobacco use, seizure, BPPV, vitamin D deficiency presents today for lightheadedness.   Past Medical History    Past Medical History:  Diagnosis Date  . Anxiety   . Carotid arterial disease ([place])    a. 06/2012 Carotid U/S: 40-50% bilat ICA stenosis, f/u 1 yr.  Marland Kitchen COPD (chronic obstructive pulmonary disease) ([place])   . Depression   . Depression   . Fibromyalgia   . GERD (gastroesophageal reflux disease) 05/28/2013  . HTN (hypertension)    now off all meds  . Hyperlipidemia   . Irritable bowel syndrome   . OA (osteoarthritis)    hips, back  . Personal history of colonic polyps   . Suicidal behavior    >20 years ago  . Syncope and collapse    a. 01/2009 Echo: EF 55-60%, mildly dil LA.   Past Surgical History:  Procedure Laterality Date  . BREAST BIOPSY Right 2020   benign  . BREAST EXCISIONAL BIOPSY Right 2019  . BREAST SURGERY  2019   benign nodule removed  . CARPAL TUNNEL RELEASE  2007   right  . CERVICAL SPINE SURGERY  2016  . CHOLECYSTECTOMY     Dr. Emilio Math  . COLONOSCOPY  2008   repeat every 5 years,polyps- [place], [place] (03/29/2005-Hyperplastic polyp)  . TUBAL LIGATION  1978  . UPPER GASTROINTESTINAL ENDOSCOPY  6/11   duodenitis,maloney dilated    Allergies  Allergies  Allergen Reactions  . Regadenoson Other (See Comments)    Trouble breathing  . Statins     Other reaction(s): Muscle Pain Joint pains  . Milnacipran     REACTION: tachycardia    History of Present Illness    Lindsay Morse is a 65 y.o. female with a hx of hypertension, COPD, bilateral carotid artery  disease, tobacco use, seizure, BPPV, vitamin D deficiency last seen 01/05/2020 by Dr. Rockey Situ.  She had previous echocardiogram in 2015 with normal LVEF, no significant valvular disease.  Carotid duplex 01/31/2020 right ICA 1 to 39% stenosis, left ICA 40 to 59% stenosed.  She was seen by her primary care provider 07/19/2020 noting episode of right arm pain and chest tightness.  She was recommended to follow-up with cardiology. Noted that Easter weekend she was out with her dogs, bent over, and felt she might pass out.   She was seen by pulmonology 07/24/2020 and transitioned to [facility] for COPD.  She presents today for follow-up.  She notes persistent lightheadedness as well as dizziness that occurs while sitting as well as with position changes.  Orthostatic vitals were negative, as below.  Notes her blood pressure at home is routinely less than 120/60.  Denies near-syncope nor syncope.  Endorses eating regular meals and staying well-hydrated.  She reports her previous episode of chest discomfort occurs in the setting of voiding for mammogram while resting and self resolved after mammogram.  She feels as if it was related to anxiety.  She does not have a formal exercise routine but plans to resume walking regimen.  She does walk and do her own shopping on a grocery store without dyspnea nor  chest pain.  Tells me she is very motivated to improve her health and has been working on weight loss.  She is additionally motivated to quit and has reduced to half pack per day.  Orthostatic VS for the past 24 hrs (Last 3 readings):  BP- Lying Pulse- Lying BP- Sitting Pulse- Sitting BP- Standing at 0 minutes Pulse- Standing at 0 minutes BP- Standing at 3 minutes Pulse- Standing at 3 minutes  08/01/20 1429 102/67 90 110/70 87 101/64 100 114/67 99   EKGs/Labs/Other Studies Reviewed:   The following studies were reviewed today:  EKG:  EKG is  ordered today.  The ekg ordered today demonstrates normal sinus rhythm 90  bpm with possible left atrial enlargement, low voltage QRS and no acute ST/T wave changes.  Recent Labs: No results found for requested labs within last 8760 hours.  Recent Lipid Panel    Component Value Date/Time   CHOL 269 (H) 10/05/2012 1213   TRIG 273.0 (H) 10/05/2012 1213   HDL 41.80 10/05/2012 1213   CHOLHDL 6 10/05/2012 1213   VLDL 54.6 (H) 10/05/2012 1213   LDLDIRECT 175.5 10/05/2012 1213     Home Medications   Current Meds  Medication Sig  . Ascorbic Acid (VITAMIN C) 100 MG tablet Take 100 mg by mouth daily.  Marland Kitchen aspirin 81 MG EC tablet Take 81 mg by mouth daily.  . Budeson-Glycopyrrol-Formoterol (BREZTRI AEROSPHERE) 160-9-4.8 MCG/ACT AERO Inhale 160 mcg into the lungs in the morning and at bedtime.  . bumetanide (BUMEX) 1 MG tablet Take 1 mg by mouth daily.  Marland Kitchen buPROPion (WELLBUTRIN SR) 150 MG 12 hr tablet Take one tablet for three days, then increase to one tablet twice daily.  . calcium-vitamin D (OSCAL WITH D) 500-200 MG-UNIT tablet Take 1 tablet by mouth.  . carvedilol (COREG) 12.5 MG tablet Take 12.5 mg by mouth 2 (two) times daily with a meal.  . co-enzyme Q-10 30 MG capsule Take 100 mg by mouth daily.  . diclofenac Sodium (VOLTAREN) 1 % GEL Apply topically.  Marland Kitchen ezetimibe (ZETIA) 10 MG tablet Take 1 tablet by mouth daily.  Marland Kitchen levETIRAcetam (KEPPRA) 1000 MG tablet Take 1 tablet by mouth 2 (two) times daily.  Marland Kitchen losartan (COZAAR) 50 MG tablet Take 1 tablet (50 mg total) by mouth daily.  . Magnesium ([employer]S TRIPLE MAGNESIUM COMPLEX) 400 MG CAPS Take by mouth 1 day or 1 dose.  . milk thistle 175 MG tablet Take 1,000 mg by mouth daily.  . montelukast (SINGULAIR) 10 MG tablet Take 10 mg by mouth at bedtime.  . pantoprazole (PROTONIX) 40 MG tablet Take 1 tablet (40 mg total) by mouth daily.  Marland Kitchen rOPINIRole (REQUIP) 0.5 MG tablet Take by mouth.  Marland Kitchen tiZANidine (ZANAFLEX) 4 MG tablet Take 1 tablet (4 mg total) by mouth at bedtime. As needed for muscle spasm.  . vitamin B-12  (CYANOCOBALAMIN) 1000 MCG tablet Take 1,000 mcg by mouth daily.  Marland Kitchen zinc gluconate 50 MG tablet Take 50 mg by mouth daily.  . [DISCONTINUED] aspirin 81 MG tablet Take 81 mg by mouth daily.  . [DISCONTINUED] budesonide-formoterol (SYMBICORT) 160-4.5 MCG/ACT inhaler Inhale 2 puffs into the lungs 2 (two) times daily.  . [DISCONTINUED] diclofenac sodium (VOLTAREN) 1 % GEL Apply 4 g topically 4 (four) times daily as needed.  . [DISCONTINUED] levETIRAcetam (KEPPRA) 1000 MG tablet Take 1,000 mg by mouth 2 (two) times daily.  . [DISCONTINUED] lidocaine (LIDODERM) 5 % Place 2 patches onto the skin daily. Remove & Discard patch  within 12 hours or as directed by MD  . [DISCONTINUED] losartan (COZAAR) 100 MG tablet Take 1 tablet (100 mg total) by mouth daily.  . [DISCONTINUED] tiZANidine (ZANAFLEX) 4 MG tablet Take by mouth.     Review of Systems  All other systems reviewed and are otherwise negative except as noted above.  Physical Exam    VS:  BP 110/66   Pulse 90   Ht 5' 1.1" (1.552 m)   Wt 189 lb (85.7 kg)   BMI 35.59 kg/m  , BMI Body mass index is 35.59 kg/m.  Wt Readings from Last 3 Encounters:  08/01/20 189 lb (85.7 kg)  07/24/20 190 lb 9.6 oz (86.5 kg)  06/27/20 182 lb (82.6 kg)    GEN: Well nourished, overweight, well developed, in no acute distress. HEENT: normal. Neck: Supple, no JVD, carotid bruits, or masses. Cardiac: RRR, no murmurs, rubs, or gallops. No clubbing, cyanosis, edema.  Radials/PT 2+ and equal bilaterally.  Respiratory:  Respirations regular and unlabored, clear to auscultation bilaterally. GI: Soft, nontender, nondistended. MS: No deformity or atrophy. Skin: Warm and dry, no rash. Neuro:  Strength and sensation are intact. Psych: Normal affect.  Assessment & Plan    1. Carotid artery disease- duplex 01/2020 a right ICA 1 to 39% stenosed, left ICA 40-59% stenosis.  Consider repeat study in 01/2021 for monitoring.  Continue aspirin, Zetia.  Previous intolerance  to statins with myalgias.  Smoking cessation encouraged.  2. HTN -now with hypotension likely in the setting of weight loss.  Reduce losartan to 50 mg daily.  She will continue to monitor blood pressure at home.  Continue Bumex 1 mg daily, Coreg 12.5 mg twice daily.  Reassess blood pressure via MyChart message in 2 weeks.  3. Tobacco use -very motivated to quit.  Start Wellbutrin 150mg  daily x 3 days then twice daily for 3 months for smoking cessation.  We discussed the importance of setting quit date. Smoking cessation encouraged. Recommend utilization of [id number].  4. HLD, LDL goal <70 -intolerance to statins including Crestor, Lipitor with myalgia.  Tolerating Zetia 10 mg daily.  Disposition: Follow up in 2 month(s) with Dr. Rockey Situ or APP   Signed, Loel Dubonnet, NP 08/01/2020, 4:54 PM [place]

## 2020-08-01 NOTE — Patient Instructions (Addendum)
Medication Instructions:  Your physician has recommended you make the following change in your medication:   CHANGE Losartan to 50mg  daily  START Wellbutrin to help with smoking cessation  *Take one tablet daily for three days, then take one tablet twice daily for 3 months* Be sure to set a quit date  *If you need a refill on your cardiac medications before your next appointment, please call your pharmacy*  Lab Work: None ordered today  Testing/Procedures: Your EKG today was stable compared to previous and showed normal sinus rhythm.   Follow-Up: At [facility], you and your health needs are our priority.  As part of our continuing mission to provide you with exceptional heart care, we have created designated Provider Care Teams.  These Care Teams include your primary Cardiologist (physician) and Advanced Practice Providers (APPs -  Physician Assistants and Nurse Practitioners) who all work together to provide you with the care you need, when you need it.   Your next appointment:   2 month(s)  The format for your next appointment:   In Person  Provider:   You may see Ida Rogue, MD or one of the following Advanced Practice Providers on your designated Care Team:    Murray Hodgkins, NP  Christell Faith, PA-C  Marrianne Mood, PA-C  Cadence Kathlen Mody, [place]  Other Instructions   Managing the Challenge of Quitting Smoking Quitting smoking is a physical and mental challenge. You will face cravings, withdrawal symptoms, and temptation. Before quitting, work with your health care provider to make a plan that can help you manage quitting. Preparation can help you quit and keep you from giving in. How to manage lifestyle changes Managing stress Stress can make you want to smoke, and wanting to smoke may cause stress. It is important to find ways to manage your stress. You might try some of the following:  Practice relaxation techniques. ? Breathe slowly and deeply, in through  your nose and out through your mouth. ? Listen to music. ? Soak in a bath or take a shower. ? Imagine a peaceful place or vacation.  Get some support. ? Talk with family or friends about your stress. ? Join a support group. ? Talk with a counselor or therapist.  Get some physical activity. ? Go for a walk, run, or bike ride. ? Play a favorite sport. ? Practice yoga.   Medicines Talk with your health care provider about medicines that might help you deal with cravings and make quitting easier for you. Relationships Social situations can be difficult when you are quitting smoking. To manage this, you can:  Avoid parties and other social situations where people might be smoking.  Avoid alcohol.  Leave right away if you have the urge to smoke.  Explain to your family and friends that you are quitting smoking. Ask for support and let them know you might be a bit grumpy.  Plan activities where smoking is not an option. General instructions Be aware that many people gain weight after they quit smoking. However, not everyone does. To keep from gaining weight, have a plan in place before you quit and stick to the plan after you quit. Your plan should include:  Having healthy snacks. When you have a craving, it may help to: ? Eat popcorn, carrots, celery, or other cut vegetables. ? Chew sugar-free gum.  Changing how you eat. ? Eat small portion sizes at meals. ? Eat 4-6 small meals throughout the day instead of 1-2 large meals a  day. ? Be mindful when you eat. Do not watch television or do other things that might distract you as you eat.  Exercising regularly. ? Make time to exercise each day. If you do not have time for a long workout, do short bouts of exercise for 5-10 minutes several times a day. ? Do some form of strengthening exercise, such as weight lifting. ? Do some exercise that gets your heart beating and causes you to breathe deeply, such as walking fast, running,  swimming, or biking. This is very important.  Drinking plenty of water or other low-calorie or no-calorie drinks. Drink 6-8 glasses of water daily.   How to recognize withdrawal symptoms Your body and mind may experience discomfort as you try to get used to not having nicotine in your system. These effects are called withdrawal symptoms. They may include:  Feeling hungrier than normal.  Having trouble concentrating.  Feeling irritable or restless.  Having trouble sleeping.  Feeling depressed.  Craving a cigarette. To manage withdrawal symptoms:  Avoid places, people, and activities that trigger your cravings.  Remember why you want to quit.  Get plenty of sleep.  Avoid coffee and other caffeinated drinks. These may worsen some of your symptoms. These symptoms may surprise you. But be assured that they are normal to have when quitting smoking. How to manage cravings Come up with a plan for how to deal with your cravings. The plan should include the following:  A definition of the specific situation you want to deal with.  An alternative action you will take.  A clear idea for how this action will help.  The name of someone who might help you with this. Cravings usually last for 5-10 minutes. Consider taking the following actions to help you with your plan to deal with cravings:  Keep your mouth busy. ? Chew sugar-free gum. ? Suck on hard candies or a straw. ? Brush your teeth.  Keep your hands and body busy. ? Change to a different activity right away. ? Squeeze or play with a ball. ? Do an activity or a hobby, such as making bead jewelry, practicing needlepoint, or working with wood. ? Mix up your normal routine. ? Take a short exercise break. Go for a quick walk or run up and down stairs.  Focus on doing something kind or helpful for someone else.  Call a friend or family member to talk during a craving.  Join a support group.  Contact a quitline. Where to  find support To get help or find a support group:  Call the [facility]'s Smoking Quitline: 1-800-QUIT NOW (873) 273-9267)  Visit the website of the Substance Abuse and [place]: ktimeonline.com  Text QUIT to SmokefreeTXT: 427062 Where to find more information Visit these websites to find more information on quitting smoking:  [place]: www.smokefree.gov  American Lung Association: www.lung.org  American Cancer Society: www.cancer.org  Centers for Disease Control and Prevention: http://www.wolf.info/  American Heart Association: www.heart.org Contact a health care provider if:  You want to change your plan for quitting.  The medicines you are taking are not helping.  Your eating feels out of control or you cannot sleep. Get help right away if:  You feel depressed or become very anxious. Summary  Quitting smoking is a physical and mental challenge. You will face cravings, withdrawal symptoms, and temptation to smoke again. Preparation can help you as you go through these challenges.  Try different techniques to manage stress,  handle social situations, and prevent weight gain.  You can deal with cravings by keeping your mouth busy (such as by chewing gum), keeping your hands and body busy, calling family or friends, or contacting a quitline for people who want to quit smoking.  You can deal with withdrawal symptoms by avoiding places where people smoke, getting plenty of rest, and avoiding drinks with caffeine. This information is not intended to replace advice given to you by your health care provider. Make sure you discuss any questions you have with your health care provider. Document Revised: 12/22/2018 Document Reviewed: 12/22/2018 Elsevier Patient Education  [place].

## 2020-08-01 NOTE — Addendum Note (Signed)
Addended by: Lurlean Nanny on: 08/01/2020 04:30 PM   Modules accepted: Orders

## 2020-08-11 ENCOUNTER — Other Ambulatory Visit: Payer: Self-pay | Admitting: Family

## 2020-08-11 DIAGNOSIS — Z720 Tobacco use: Secondary | ICD-10-CM

## 2020-08-15 NOTE — Telephone Encounter (Signed)
Please review pharmacy concern and advise if OK to refill. Thank you!

## 2020-08-16 NOTE — Telephone Encounter (Signed)
Spoke to pt. She states that she took Milnacipran 7-8 years ago.  She does prefer to do a trial of Wellbutrin and understands to discontinue if she develops tachycardia.  Will notify pharmacy.

## 2020-08-17 ENCOUNTER — Emergency Department: Payer: Medicare Other

## 2020-08-17 ENCOUNTER — Inpatient Hospital Stay
Admission: EM | Admit: 2020-08-17 | Discharge: 2020-08-21 | DRG: 177 | Disposition: A | Discharge status: Home Health | Payer: Medicare Other | Attending: Internal Medicine | Admitting: Internal Medicine

## 2020-08-17 ENCOUNTER — Other Ambulatory Visit: Payer: Self-pay

## 2020-08-17 DIAGNOSIS — J9601 Acute respiratory failure with hypoxia: Secondary | ICD-10-CM | POA: Diagnosis present

## 2020-08-17 DIAGNOSIS — Z28310 Unvaccinated for covid-19: Secondary | ICD-10-CM | POA: Diagnosis not present

## 2020-08-17 DIAGNOSIS — M797 Fibromyalgia: Secondary | ICD-10-CM | POA: Diagnosis present

## 2020-08-17 DIAGNOSIS — F32A Depression, unspecified: Secondary | ICD-10-CM | POA: Diagnosis present

## 2020-08-17 DIAGNOSIS — U099 Post covid-19 condition, unspecified: Secondary | ICD-10-CM | POA: Diagnosis present

## 2020-08-17 DIAGNOSIS — J189 Pneumonia, unspecified organism: Secondary | ICD-10-CM

## 2020-08-17 DIAGNOSIS — J441 Chronic obstructive pulmonary disease with (acute) exacerbation: Secondary | ICD-10-CM | POA: Diagnosis present

## 2020-08-17 DIAGNOSIS — Z8349 Family history of other endocrine, nutritional and metabolic diseases: Secondary | ICD-10-CM

## 2020-08-17 DIAGNOSIS — I110 Hypertensive heart disease with heart failure: Secondary | ICD-10-CM | POA: Diagnosis present

## 2020-08-17 DIAGNOSIS — Z79899 Other long term (current) drug therapy: Secondary | ICD-10-CM | POA: Diagnosis not present

## 2020-08-17 DIAGNOSIS — I5032 Chronic diastolic (congestive) heart failure: Secondary | ICD-10-CM | POA: Diagnosis present

## 2020-08-17 DIAGNOSIS — E871 Hypo-osmolality and hyponatremia: Secondary | ICD-10-CM | POA: Diagnosis present

## 2020-08-17 DIAGNOSIS — R0602 Shortness of breath: Secondary | ICD-10-CM

## 2020-08-17 DIAGNOSIS — R7989 Other specified abnormal findings of blood chemistry: Secondary | ICD-10-CM

## 2020-08-17 DIAGNOSIS — E785 Hyperlipidemia, unspecified: Secondary | ICD-10-CM | POA: Diagnosis present

## 2020-08-17 DIAGNOSIS — U071 COVID-19: Secondary | ICD-10-CM | POA: Diagnosis present

## 2020-08-17 DIAGNOSIS — Z87891 Personal history of nicotine dependence: Secondary | ICD-10-CM | POA: Diagnosis not present

## 2020-08-17 DIAGNOSIS — Z888 Allergy status to other drugs, medicaments and biological substances status: Secondary | ICD-10-CM | POA: Diagnosis not present

## 2020-08-17 DIAGNOSIS — F17200 Nicotine dependence, unspecified, uncomplicated: Secondary | ICD-10-CM | POA: Diagnosis present

## 2020-08-17 DIAGNOSIS — I10 Essential (primary) hypertension: Secondary | ICD-10-CM | POA: Diagnosis present

## 2020-08-17 DIAGNOSIS — K219 Gastro-esophageal reflux disease without esophagitis: Secondary | ICD-10-CM | POA: Diagnosis present

## 2020-08-17 DIAGNOSIS — J1282 Pneumonia due to coronavirus disease 2019: Secondary | ICD-10-CM | POA: Diagnosis present

## 2020-08-17 DIAGNOSIS — E860 Dehydration: Secondary | ICD-10-CM | POA: Diagnosis present

## 2020-08-17 DIAGNOSIS — Z83438 Family history of other disorder of lipoprotein metabolism and other lipidemia: Secondary | ICD-10-CM

## 2020-08-17 DIAGNOSIS — J440 Chronic obstructive pulmonary disease with acute lower respiratory infection: Secondary | ICD-10-CM | POA: Diagnosis present

## 2020-08-17 DIAGNOSIS — G40909 Epilepsy, unspecified, not intractable, without status epilepticus: Secondary | ICD-10-CM | POA: Diagnosis present

## 2020-08-17 DIAGNOSIS — Z7982 Long term (current) use of aspirin: Secondary | ICD-10-CM

## 2020-08-17 DIAGNOSIS — Z825 Family history of asthma and other chronic lower respiratory diseases: Secondary | ICD-10-CM | POA: Diagnosis not present

## 2020-08-17 DIAGNOSIS — F419 Anxiety disorder, unspecified: Secondary | ICD-10-CM | POA: Diagnosis present

## 2020-08-17 DIAGNOSIS — Z66 Do not resuscitate: Secondary | ICD-10-CM | POA: Diagnosis present

## 2020-08-17 DIAGNOSIS — Z8249 Family history of ischemic heart disease and other diseases of the circulatory system: Secondary | ICD-10-CM

## 2020-08-17 LAB — COMPREHENSIVE METABOLIC PANEL
ALT: 21 U/L (ref 0–44)
AST: 32 U/L (ref 15–41)
Albumin: 4 g/dL (ref 3.5–5.0)
Alkaline Phosphatase: 51 U/L (ref 38–126)
Anion gap: 12 (ref 5–15)
BUN: 22 mg/dL (ref 8–23)
CO2: 22 mmol/L (ref 22–32)
Calcium: 8.7 mg/dL — ABNORMAL LOW (ref 8.9–10.3)
Chloride: 97 mmol/L — ABNORMAL LOW (ref 98–111)
Creatinine, Ser: 0.97 mg/dL (ref 0.44–1.00)
GFR, Estimated: >60 mL/min (ref >60)
Glucose, Bld: 131 mg/dL — ABNORMAL HIGH (ref 70–99)
Potassium: 3.8 mmol/L (ref 3.5–5.1)
Sodium: 131 mmol/L — ABNORMAL LOW (ref 135–145)
Total Bilirubin: 0.7 mg/dL (ref 0.3–1.2)
Total Protein: 7.4 g/dL (ref 6.5–8.1)

## 2020-08-17 LAB — RESP PANEL BY RT-PCR (FLU A&B, COVID) ARPGX2
Influenza A by PCR: NEGATIVE
Influenza B by PCR: NEGATIVE
SARS Coronavirus 2 by RT PCR: NEGATIVE

## 2020-08-17 LAB — CBC WITH DIFFERENTIAL/PLATELET
Abs Immature Granulocytes: 0.03 10*3/uL (ref 0.00–0.07)
Basophils Absolute: 0 10*3/uL (ref 0.0–0.1)
Basophils Relative: 0 %
Eosinophils Absolute: 0 10*3/uL (ref 0.0–0.5)
Eosinophils Relative: 0 %
HCT: 38.5 % (ref 36.0–46.0)
Hemoglobin: 13.4 g/dL (ref 12.0–15.0)
Immature Granulocytes: 0 %
Lymphocytes Relative: 15 %
Lymphs Abs: 1.2 10*3/uL (ref 0.7–4.0)
MCH: 32.5 pg (ref 26.0–34.0)
MCHC: 34.8 g/dL (ref 30.0–36.0)
MCV: 93.4 fL (ref 80.0–100.0)
Monocytes Absolute: 0.4 10*3/uL (ref 0.1–1.0)
Monocytes Relative: 5 %
Neutro Abs: 6.1 10*3/uL (ref 1.7–7.7)
Neutrophils Relative %: 80 %
Platelets: 192 10*3/uL (ref 150–400)
RBC: 4.12 MIL/uL (ref 3.87–5.11)
RDW: 12.4 % (ref 11.5–15.5)
Smear Review: NORMAL
WBC: 7.7 10*3/uL (ref 4.0–10.5)
nRBC: 0 % (ref 0.0–0.2)

## 2020-08-17 LAB — LACTIC ACID, PLASMA
Lactic Acid, Venous: 1.1 mmol/L (ref 0.5–1.9)
Lactic Acid, Venous: 1.2 mmol/L (ref 0.5–1.9)

## 2020-08-17 LAB — APTT: aPTT: 32 seconds (ref 24–36)

## 2020-08-17 LAB — MAGNESIUM: Magnesium: 2.1 mg/dL (ref 1.7–2.4)

## 2020-08-17 LAB — PROCALCITONIN: Procalcitonin: 0.24 ng/mL

## 2020-08-17 LAB — PROTIME-INR
INR: 1 (ref 0.8–1.2)
Prothrombin Time: 12.7 seconds (ref 11.4–15.2)

## 2020-08-17 LAB — TROPONIN I (HIGH SENSITIVITY)
Troponin I (High Sensitivity): 17 ng/L (ref <18)
Troponin I (High Sensitivity): 17 ng/L (ref <18)

## 2020-08-17 LAB — D-DIMER, QUANTITATIVE: D-Dimer, Quant: 2.94 ug/mL-FEU — ABNORMAL HIGH (ref 0.00–0.50)

## 2020-08-17 MED ORDER — SODIUM CHLORIDE 0.9 % IV SOLN
500.0000 mg | Freq: Once | INTRAVENOUS | Status: AC
  Administered 2020-08-17: 500 mg via INTRAVENOUS
  Filled 2020-08-17: qty 500

## 2020-08-17 MED ORDER — ROPINIROLE HCL 1 MG PO TABS
0.5000 mg | ORAL_TABLET | Freq: Three times a day (TID) | ORAL | Status: DC
  Administered 2020-08-17 – 2020-08-21 (×11): 0.5 mg via ORAL
  Filled 2020-08-17 (×11): qty 1

## 2020-08-17 MED ORDER — ONDANSETRON HCL 4 MG/2ML IJ SOLN: 4.0000 mg | Freq: Four times a day (QID) | INTRAMUSCULAR | Status: DC | PRN

## 2020-08-17 MED ORDER — IOHEXOL 350 MG/ML SOLN
75.0000 mL | Freq: Once | INTRAVENOUS | Status: AC | PRN
  Administered 2020-08-17: 75 mL via INTRAVENOUS

## 2020-08-17 MED ORDER — BUPROPION HCL ER (SR) 150 MG PO TB12: 150.0000 mg | ORAL_TABLET | Freq: Every day | ORAL | Status: DC

## 2020-08-17 MED ORDER — ALBUTEROL SULFATE HFA 108 (90 BASE) MCG/ACT IN AERS
2.0000 | INHALATION_SPRAY | RESPIRATORY_TRACT | Status: DC
  Administered 2020-08-17 – 2020-08-21 (×23): 2 via RESPIRATORY_TRACT
  Filled 2020-08-17: qty 6.7

## 2020-08-17 MED ORDER — KETOROLAC TROMETHAMINE 30 MG/ML IJ SOLN
15.0000 mg | Freq: Once | INTRAMUSCULAR | Status: AC
  Administered 2020-08-17: 15 mg via INTRAVENOUS
  Filled 2020-08-17: qty 1

## 2020-08-17 MED ORDER — BENZONATATE 100 MG PO CAPS
100.0000 mg | ORAL_CAPSULE | Freq: Once | ORAL | Status: AC
  Administered 2020-08-17: 100 mg via ORAL
  Filled 2020-08-17: qty 1

## 2020-08-17 MED ORDER — BUDESON-GLYCOPYRROL-FORMOTEROL 160-9-4.8 MCG/ACT IN AERO: 160.0000 ug | INHALATION_SPRAY | Freq: Every day | RESPIRATORY_TRACT | Status: DC

## 2020-08-17 MED ORDER — VITAMIN C 100 MG PO TABS: 100.0000 mg | ORAL_TABLET | Freq: Every day | ORAL | Status: DC

## 2020-08-17 MED ORDER — PREDNISONE 20 MG PO TABS: 50.0000 mg | ORAL_TABLET | Freq: Every day | ORAL | Status: DC

## 2020-08-17 MED ORDER — ACETAMINOPHEN 325 MG PO TABS
650.0000 mg | ORAL_TABLET | Freq: Once | ORAL | Status: AC | PRN
  Administered 2020-08-17: 650 mg via ORAL
  Filled 2020-08-17: qty 2

## 2020-08-17 MED ORDER — SODIUM CHLORIDE 0.9 % IV SOLN
200.0000 mg | Freq: Once | INTRAVENOUS | Status: AC
  Administered 2020-08-17: 200 mg via INTRAVENOUS
  Filled 2020-08-17: qty 40

## 2020-08-17 MED ORDER — LOSARTAN POTASSIUM 50 MG PO TABS
50.0000 mg | ORAL_TABLET | Freq: Every day | ORAL | Status: DC
  Administered 2020-08-18 – 2020-08-21 (×4): 50 mg via ORAL
  Filled 2020-08-17 (×5): qty 1

## 2020-08-17 MED ORDER — EZETIMIBE 10 MG PO TABS
10.0000 mg | ORAL_TABLET | Freq: Every day | ORAL | Status: DC
  Administered 2020-08-18 – 2020-08-21 (×4): 10 mg via ORAL
  Filled 2020-08-17 (×4): qty 1

## 2020-08-17 MED ORDER — PANTOPRAZOLE SODIUM 40 MG PO TBEC
40.0000 mg | DELAYED_RELEASE_TABLET | Freq: Every day | ORAL | Status: DC
  Administered 2020-08-18 – 2020-08-21 (×4): 40 mg via ORAL
  Filled 2020-08-17 (×4): qty 1

## 2020-08-17 MED ORDER — MONTELUKAST SODIUM 10 MG PO TABS
10.0000 mg | ORAL_TABLET | Freq: Every day | ORAL | Status: DC
  Administered 2020-08-17 – 2020-08-20 (×4): 10 mg via ORAL
  Filled 2020-08-17 (×5): qty 1

## 2020-08-17 MED ORDER — METHYLPREDNISOLONE SODIUM SUCC 125 MG IJ SOLR
1.0000 mg/kg | Freq: Two times a day (BID) | INTRAMUSCULAR | Status: DC
  Administered 2020-08-17 – 2020-08-19 (×4): 82.5 mg via INTRAVENOUS
  Filled 2020-08-17 (×4): qty 2

## 2020-08-17 MED ORDER — TIZANIDINE HCL 4 MG PO TABS
4.0000 mg | ORAL_TABLET | Freq: Every day | ORAL | Status: DC
  Administered 2020-08-17 – 2020-08-20 (×4): 4 mg via ORAL
  Filled 2020-08-17 (×7): qty 1

## 2020-08-17 MED ORDER — BUMETANIDE 1 MG PO TABS: 1.0000 mg | ORAL_TABLET | Freq: Every day | ORAL | Status: DC

## 2020-08-17 MED ORDER — LEVETIRACETAM 500 MG PO TABS
1000.0000 mg | ORAL_TABLET | Freq: Two times a day (BID) | ORAL | Status: DC
  Administered 2020-08-17 – 2020-08-21 (×8): 1000 mg via ORAL
  Filled 2020-08-17 (×8): qty 2

## 2020-08-17 MED ORDER — SODIUM CHLORIDE 0.9 % IV SOLN
2.0000 g | INTRAVENOUS | Status: DC
  Administered 2020-08-17 – 2020-08-20 (×4): 2 g via INTRAVENOUS
  Filled 2020-08-17 (×2): qty 2
  Filled 2020-08-17 (×2): qty 20
  Filled 2020-08-17: qty 2

## 2020-08-17 MED ORDER — SODIUM CHLORIDE 0.9 % IV SOLN
100.0000 mg | Freq: Every day | INTRAVENOUS | Status: DC
  Administered 2020-08-18: 100 mg via INTRAVENOUS
  Filled 2020-08-17 (×3): qty 20

## 2020-08-17 MED ORDER — SODIUM CHLORIDE 0.9 % IV SOLN
INTRAVENOUS | Status: DC | PRN
  Administered 2020-08-17 – 2020-08-19 (×6): 250 mL via INTRAVENOUS
  Administered 2020-08-19 – 2020-08-20 (×2): 1000 mL via INTRAVENOUS
  Administered 2020-08-20: 250 mL via INTRAVENOUS
  Administered 2020-08-21: 1000 mL via INTRAVENOUS

## 2020-08-17 MED ORDER — SODIUM CHLORIDE 0.9 % IV SOLN
1.0000 g | Freq: Once | INTRAVENOUS | Status: AC
  Administered 2020-08-17: 1 g via INTRAVENOUS
  Filled 2020-08-17: qty 10

## 2020-08-17 MED ORDER — ASPIRIN EC 81 MG PO TBEC
81.0000 mg | DELAYED_RELEASE_TABLET | Freq: Every day | ORAL | Status: DC
  Administered 2020-08-18 – 2020-08-21 (×4): 81 mg via ORAL
  Filled 2020-08-17 (×4): qty 1

## 2020-08-17 MED ORDER — ENOXAPARIN SODIUM 40 MG/0.4ML IJ SOSY
40.0000 mg | PREFILLED_SYRINGE | INTRAMUSCULAR | Status: DC
  Administered 2020-08-17 – 2020-08-18 (×2): 40 mg via SUBCUTANEOUS
  Filled 2020-08-17 (×2): qty 0.4

## 2020-08-17 MED ORDER — CALCIUM CARBONATE-VITAMIN D 500-200 MG-UNIT PO TABS
1.0000 | ORAL_TABLET | Freq: Every day | ORAL | Status: DC
  Administered 2020-08-18 – 2020-08-21 (×4): 1 via ORAL
  Filled 2020-08-17 (×4): qty 1

## 2020-08-17 MED ORDER — SODIUM CHLORIDE 0.9 % IV SOLN
500.0000 mg | INTRAVENOUS | Status: DC
  Administered 2020-08-17 – 2020-08-20 (×4): 500 mg via INTRAVENOUS
  Filled 2020-08-17 (×5): qty 500

## 2020-08-17 MED ORDER — ONDANSETRON HCL 4 MG PO TABS: 4.0000 mg | ORAL_TABLET | Freq: Four times a day (QID) | ORAL | Status: DC | PRN

## 2020-08-17 MED ORDER — LACTATED RINGERS IV BOLUS
1000.0000 mL | Freq: Once | INTRAVENOUS | Status: AC
  Administered 2020-08-17: 1000 mL via INTRAVENOUS

## 2020-08-17 MED ORDER — VITAMIN B-12 1000 MCG PO TABS
1000.0000 ug | ORAL_TABLET | Freq: Every day | ORAL | Status: DC
  Administered 2020-08-18 – 2020-08-21 (×4): 1000 ug via ORAL
  Filled 2020-08-17 (×5): qty 1

## 2020-08-17 MED ORDER — GUAIFENESIN-DM 100-10 MG/5ML PO SYRP
10.0000 mL | ORAL_SOLUTION | ORAL | Status: DC | PRN
  Administered 2020-08-17 – 2020-08-18 (×4): 10 mL via ORAL
  Filled 2020-08-17 (×4): qty 10

## 2020-08-17 MED ORDER — ZINC SULFATE 220 (50 ZN) MG PO CAPS
220.0000 mg | ORAL_CAPSULE | Freq: Every day | ORAL | Status: DC
  Administered 2020-08-18 – 2020-08-21 (×4): 220 mg via ORAL
  Filled 2020-08-17 (×4): qty 1

## 2020-08-17 MED ORDER — CARVEDILOL 12.5 MG PO TABS
12.5000 mg | ORAL_TABLET | Freq: Two times a day (BID) | ORAL | Status: DC
  Administered 2020-08-18 – 2020-08-21 (×7): 12.5 mg via ORAL
  Filled 2020-08-17 (×7): qty 1

## 2020-08-17 NOTE — ED Notes (Signed)
Pt given water per verbal ok from Dr Tamala Julian.

## 2020-08-17 NOTE — ED Provider Notes (Signed)
[facility] Emergency Department Provider Note  ____________________________________________   Event Date/Time   First MD Initiated Contact with Patient 08/17/20 1427     (approximate)  I have reviewed the triage vital signs and the nursing notes.   HISTORY  Chief Complaint Shortness of Breath   HPI Lindsay Morse is a 65 y.o. female past medical history of COPD with ongoing tobacco abuse until the past week, HTN, HDL, OSA, depression and carotid artery disease who presents for assessment of ough, shortness of breath, chest pain, headaches myalgias, nausea, diarrhea and fatigue.  Patient dates all of her symptoms began nearly 2 weeks ago.  Patient states she took a home COVID test that was positive and went to clinic yesterday where she had a chest x-ray that showed possible pneumonia and was prescribed steroids but has not picked them up yet.  She does not normally wear oxygen.  She has not had any vomiting, rashes, falls or injuries, urinary symptoms or any other clear associated sick symptoms.  She denies any acute complaints at this time.  She not receive any COVID vaccines prior to today.         Past Medical History:  Diagnosis Date  . Anxiety   . Carotid arterial disease ([place])    a. 06/2012 Carotid U/S: 40-50% bilat ICA stenosis, f/u 1 yr.  Marland Kitchen COPD (chronic obstructive pulmonary disease) ([place])   . Depression   . Depression   . Fibromyalgia   . GERD (gastroesophageal reflux disease) 05/28/2013  . HTN (hypertension)    now off all meds  . Hyperlipidemia   . Irritable bowel syndrome   . OA (osteoarthritis)    hips, back  . Personal history of colonic polyps   . Suicidal behavior    >20 years ago  . Syncope and collapse    a. 01/2009 Echo: EF 55-60%, mildly dil LA.    Patient Active Problem List   Diagnosis Date Noted  . PAD (peripheral artery disease) ([place]) 01/01/2020  . Aortic atherosclerosis ([place]) 01/01/2020  . Anxiety 09/14/2019  . Lung  nodule 09/14/2019  . Seborrheic keratoses 09/14/2019  . Acute pain of right knee 09/14/2019  . Hypertension 10/07/2018  . Seizure disorder ([place]) 02/18/2014  . Syncope 10/14/2013  . SOB (shortness of breath) 10/14/2013  . Aortic calcification, 04/07/2012 CT Chest 05/28/2013  . GERD (gastroesophageal reflux disease) 05/28/2013  . Carotid artery calcification 05/28/2013  . Obesity (BMI 30-39.9) 05/28/2013  . Herniation of cervical intervertebral disc with radiculopathy 05/28/2013  . Hemoptysis 03/25/2012  . 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 COLONIC POLYPS, HX OF 08/30/2009  . IBS 06/26/2009  . TOBACCO ABUSE 04/07/2009  . SLEEP DISORDER 09/26/2008  . Hyperlipidemia 08/19/2008  . Major depressive disorder, recurrent episode, in partial remission ([place]) 08/09/2008  . Fibromyalgia 11/18/2006  . Restless leg syndrome 11/18/2006    Past Surgical History:  Procedure Laterality Date  . BREAST BIOPSY Right 2020   benign  . BREAST EXCISIONAL BIOPSY Right 2019  . BREAST SURGERY  2019   benign nodule removed  . CARPAL TUNNEL RELEASE  2007   right  . CERVICAL SPINE SURGERY  2016  . CHOLECYSTECTOMY     Dr. Emilio Math  . COLONOSCOPY  2008   repeat every 5 years,polyps- [place], [place] (03/29/2005-Hyperplastic polyp)  . TUBAL LIGATION  1978  . UPPER GASTROINTESTINAL ENDOSCOPY  6/11   duodenitis,maloney dilated    Prior to Admission medications   Medication Sig Start Date End Date  Taking? Authorizing Provider  Ascorbic Acid (VITAMIN C) 100 MG tablet Take 100 mg by mouth daily.    [provider]  aspirin 81 MG EC tablet Take 81 mg by mouth daily.    [provider]  Budeson-Glycopyrrol-Formoterol (BREZTRI AEROSPHERE) 160-9-4.8 MCG/ACT AERO Inhale 160 mcg into the lungs in the morning and at bedtime. 07/24/20   Tyler Pita, MD  bumetanide (BUMEX) 1 MG tablet Take 1 mg by mouth daily.    [provider]  buPROPion (WELLBUTRIN SR) 150 MG 12 hr tablet TAKE ONE  TABLET (150MG ) BY MOUTH EVERY DAY FOR THREE DAYS, THEN INCREASE TO ONE TABLET BY MOUTH TWICE DAILY IF TOLERATED. 08/16/20   Loel Dubonnet, NP  calcium-vitamin D (OSCAL WITH D) 500-200 MG-UNIT tablet Take 1 tablet by mouth.    [provider]  carvedilol (COREG) 12.5 MG tablet Take 12.5 mg by mouth 2 (two) times daily with a meal.    [provider]  co-enzyme Q-10 30 MG capsule Take 100 mg by mouth daily.    [provider]  diclofenac Sodium (VOLTAREN) 1 % GEL Apply topically. 04/05/20   [provider]  ezetimibe (ZETIA) 10 MG tablet Take 1 tablet by mouth daily. 01/05/20   [provider]  levETIRAcetam (KEPPRA) 1000 MG tablet Take 1 tablet by mouth 2 (two) times daily. 04/05/20   [provider]  losartan (COZAAR) 50 MG tablet Take 1 tablet (50 mg total) by mouth daily. 08/01/20 01/28/21  Loel Dubonnet, NP  Magnesium ([employer] TRIPLE MAGNESIUM COMPLEX) 400 MG CAPS Take by mouth 1 day or 1 dose.    [provider]  milk thistle 175 MG tablet Take 1,000 mg by mouth daily.    [provider]  montelukast (SINGULAIR) 10 MG tablet Take 10 mg by mouth at bedtime.    [provider]  pantoprazole (PROTONIX) 40 MG tablet Take 1 tablet (40 mg total) by mouth daily. 01/27/20   Tyler Pita, MD  rOPINIRole (REQUIP) 0.5 MG tablet Take by mouth. 04/05/20   [provider]  tiZANidine (ZANAFLEX) 4 MG tablet Take 1 tablet (4 mg total) by mouth at bedtime. As needed for muscle spasm. 11/08/19   Lesleigh Noe, MD  vitamin B-12 (CYANOCOBALAMIN) 1000 MCG tablet Take 1,000 mcg by mouth daily.    [provider]  zinc gluconate 50 MG tablet Take 50 mg by mouth daily.    [provider]    Allergies Regadenoson, Statins, and Milnacipran  Family History  Problem Relation Age of Onset  . Hypertension Mother   . Uterine cancer Mother        ischemic bowel; back pain, borderline DM  . Asthma Mother    . Hyperlipidemia Mother   . Coronary artery disease Father   . Heart attack Father   . Alcohol abuse Father   . Hyperlipidemia Father   . Coronary artery disease Sister        first MI at 54, 3 cardiac procedures  . Heart attack Sister   . Crohn's disease Sister   . Colon cancer Maternal Aunt   . Colon cancer Maternal Grandfather   . Thyroid disease Sister        dysfunction  . Breast cancer Neg Hx     Social History Social History   Tobacco Use  . Smoking status: Current Every Day Smoker    Packs/day: 0.50    Years: 43.00    Pack years: 21.50  Types: Cigarettes  . Smokeless tobacco: Never Used  . Tobacco comment: 07/24/2020 .5 pack.  Vaping Use  . Vaping Use: Former  Substance Use Topics  . Alcohol use: No    Alcohol/week: 1.0 - 2.0 standard drink    Types: 1 - 2 Glasses of wine per week  . Drug use: No    Review of Systems  Review of Systems  Constitutional: Positive for chills, fever and malaise/fatigue.  HENT: Positive for congestion. Negative for sore throat.   Eyes: Negative for pain.  Respiratory: Positive for cough and shortness of breath. Negative for stridor.   Cardiovascular: Positive for chest pain.  Gastrointestinal: Positive for diarrhea and nausea. Negative for vomiting.  Genitourinary: Negative for dysuria.  Musculoskeletal: Positive for myalgias.  Skin: Negative for rash.  Neurological: Positive for headaches. Negative for seizures and loss of consciousness.  Psychiatric/Behavioral: Negative for suicidal ideas.  All other systems reviewed and are negative.     ____________________________________________   PHYSICAL EXAM:  VITAL SIGNS: ED Triage Vitals  Enc Vitals Group     BP 08/17/20 1159 104/74     Pulse Rate 08/17/20 1159 (!) 112     Resp 08/17/20 1159 (!) 25     Temp 08/17/20 1159 (!) 102.2 F (39 C)     Temp Source 08/17/20 1159 Oral     SpO2 08/17/20 1159 93 %     Weight 08/17/20 1201 182 lb (82.6 kg)     Height 08/17/20  1201 5\' 1"  (1.549 m)     Head Circumference --      Peak Flow --      Pain Score 08/17/20 1204 8     Pain Loc --      Pain Edu? --      Excl. in [place]? --    Vitals:   08/17/20 1159 08/17/20 1514  BP: 104/74 112/64  Pulse: (!) 112   Resp: (!) 25 (!) 28  Temp: (!) 102.2 F (39 C)   SpO2: 93% 94%   Physical Exam Vitals and nursing note reviewed.  Constitutional:      General: She is not in acute distress.    Appearance: She is well-developed. She is ill-appearing.  HENT:     Head: Normocephalic and atraumatic.     Right Ear: External ear normal.     Left Ear: External ear normal.     Nose: Nose normal.     Mouth/Throat:     Mouth: Mucous membranes are dry.  Eyes:     Conjunctiva/sclera: Conjunctivae normal.  Cardiovascular:     Rate and Rhythm: Regular rhythm. Tachycardia present.     Heart sounds: No murmur heard.   Pulmonary:     Effort: Pulmonary effort is normal. Tachypnea present. No respiratory distress.     Breath sounds: Decreased breath sounds and rhonchi present.  Abdominal:     Palpations: Abdomen is soft.     Tenderness: There is no abdominal tenderness.  Musculoskeletal:     Cervical back: Neck supple.     Right lower leg: No edema.     Left lower leg: No edema.  Skin:    General: Skin is warm and dry.     Capillary Refill: Capillary refill takes 2 to 3 seconds.  Neurological:     Mental Status: She is alert and oriented to person, place, and time.  Psychiatric:        Mood and Affect: Mood normal.      ____________________________________________  LABS (all labs ordered are listed, but only abnormal results are displayed)  Labs Reviewed  COMPREHENSIVE METABOLIC PANEL - Abnormal; Notable for the following components:      Result Value   Sodium 131 (*)    Chloride 97 (*)    Glucose, Bld 131 (*)    Calcium 8.7 (*)    All other components within normal limits  CULTURE, BLOOD (SINGLE)  URINE CULTURE  RESP PANEL BY RT-PCR (FLU A&B, COVID)  ARPGX2  CBC WITH DIFFERENTIAL/PLATELET  PROCALCITONIN  LACTIC ACID, PLASMA  LACTIC ACID, PLASMA  PROTIME-INR  APTT  URINALYSIS, COMPLETE (UACMP) WITH MICROSCOPIC  D-DIMER, QUANTITATIVE  MAGNESIUM  TROPONIN I (HIGH SENSITIVITY)  TROPONIN I (HIGH SENSITIVITY)   ____________________________________________  EKG  Sinus tachycardia with ventricular to 111, normal axis, unremarkable intervals with multiple ST changes in inferior and anterior leads. ____________________________________________  RADIOLOGY  ED MD interpretation: CXR with Bilateral opacities concerning for pneumonia without evidence of pneumothorax, overt edema or large effusion.  Official radiology report(s): DG Chest Port 1 View  Result Date: 08/17/2020 CLINICAL DATA:  COVID-19 positive, shortness of breath, cough EXAM: PORTABLE CHEST 1 VIEW COMPARISON:  06/27/2020 FINDINGS: Cardiomediastinal contours are within normal limits. Atherosclerotic calcification of the aortic knob. Interstitial thickening with patchy airspace opacities within the perihilar and bibasilar regions. No pleural effusion or pneumothorax. IMPRESSION: Interstitial thickening with patchy airspace opacities within the perihilar and bibasilar regions suspicious for multifocal atypical/viral pneumonia. Electronically Signed   By: Davina Poke D.O.   On: 08/17/2020 14:55    ____________________________________________   PROCEDURES  Procedure(s) performed (including Critical Care):  .Critical Care Performed by: Lucrezia Starch, MD Authorized by: Lucrezia Starch, MD   Critical care provider statement:    Critical care time (minutes):  45   Critical care time was exclusive of:  Separately billable procedures and treating other patients   Critical care was necessary to treat or prevent imminent or life-threatening deterioration of the following conditions:  Respiratory failure   Critical care was time spent personally by me on the following  activities:  Discussions with consultants, evaluation of patient's response to treatment, examination of patient, ordering and performing treatments and interventions, ordering and review of laboratory studies, ordering and review of radiographic studies, pulse oximetry, re-evaluation of patient's condition, obtaining history from patient or surrogate and review of old charts     ____________________________________________   INITIAL IMPRESSION / [place] / ED COURSE      Patient presents with above-stated history exam for assessment of nearly 2 weeks of worsening chest pain, cough, shortness of breath, myalgias, fevers, headache, sore throat, congestion and chills and decreased appetite.  Per EMS patient had room air SPO2 of 88% which improved to 94% on 4 L.  On arrival to the emergency room she has an SPO2 on room air 88% which improved to 93% on 4 L.  She is known to be febrile at 102.2, tachycardic at 112 and tachypneic at 25 and SPO2 of 93%.   On exam she has some very faint rhonchi diminished breath sounds bilaterally and appears dehydrated.  Abdomen is soft nontender and she is nonfocal neuro exam.  Suspect symptoms likely related to acute COVID infection although superinfection with bacterial pneumonia causing sepsis and respiratory failure is also within differential as it is ACS, PE, metabolic derangements, symptomatic anemia and dehydration.  ECG has nonspecific findings.  However given troponin of 17 obtained greater than 3 hours after symptom onset I have low  suspicion for ACS or myocarditis.  CMP markable for sodium of 131 without any other significant electrolyte or metabolic derangements.  CBC without leukocytosis or acute anemia.  Pro-Cal is slightly elevated at 0.24.  CXR with Bilateral opacities concerning for pneumonia without evidence of pneumothorax, overt edema or large effusion.  Will obtain D-dimer to assess risk for PE and if elevated obtain CTA.  Will  treat for possible community-acquired pneumonia with Rocephin and azithromycin blood cultures.  Care patient signed over to oncoming provider approximately 1500.  Plan is to follow-up D-dimer and if elevated obtain CTA.  Regardless patient will require admission for further evaluation management of hypoxic respiratory failure in the setting of COVID.      ____________________________________________   FINAL CLINICAL IMPRESSION(S) / ED DIAGNOSES  Final diagnoses:  Acute respiratory failure with hypoxia ([place])  COVID  Dehydration  Pneumonia of both lungs due to infectious organism, unspecified part of lung    Medications  lactated ringers bolus 1,000 mL (has no administration in time range)  cefTRIAXone (ROCEPHIN) 1 g in sodium chloride 0.9 % 100 mL IVPB (has no administration in time range)  azithromycin (ZITHROMAX) 500 mg in sodium chloride 0.9 % 250 mL IVPB (has no administration in time range)  ketorolac (TORADOL) 30 MG/ML injection 15 mg (has no administration in time range)  acetaminophen (TYLENOL) tablet 650 mg (650 mg Oral Given 08/17/20 1210)     ED Discharge Orders    None       Note:  This document was prepared using Dragon voice recognition software and may include unintentional dictation errors.   Lucrezia Starch, MD 08/17/20 [id number]

## 2020-08-17 NOTE — ED Triage Notes (Signed)
Pt states she tested positive on Tuesday for covid with a home test- pt has had increasing SHOB and fever- pt has not had any medication for fever today- pt not on O2 at baseline but is on 4L and sating at 93%

## 2020-08-17 NOTE — H&P (Addendum)
History and Physical    Lindsay Morse VQQ:[id number] DOB: 12-Feb-1956 DOA: 08/17/2020  PCP: Lindsay Lighter, MD   Patient coming from: Home  I have personally briefly reviewed patient's old medical records in [place]  Chief Complaint: Shortness of breath  HPI: Lindsay Morse is a 65 y.o. female with medical history significant for depression, GERD, anxiety, hypertension, COPD, nicotine dependence, chronic diastolic dysfunction CHF, chronic respiratory failure, seizure who presents to the emergency for evaluation of shortness of breath, cough productive of clear phlegm, fever, nausea, vomiting and diarrhea which she has had for about 5 days.  Patient also complains of anorexia and generalized weakness. Patient had a positive home COVID test on 08/15/20 .  Patient was said to have room air pulse oximetry of 88% by EMS and was placed on 4 L of oxygen with improvement in her pulse oximetry to 95%. She is unvaccinated against the COVID-19 virus and denies having any sick contacts. Her symptoms have progressively worsened over the last 5 days and she also has diffuse wheezes. She denies having any chest pain, no leg swelling, no blurred vision, no palpitations, no diaphoresis, no urinary symptoms, no focal deficits. Labs show sodium 131, potassium 3.8, chloride 97, bicarb 22, glucose 131, BUN 22, creatinine 0.97, calcium 8.7, magnesium 2.1, alkaline phosphatase 51, albumin 4.0, AST 32, ALT 21, total protein 7.4, troponin 17, lactic acid 1.2, procalcitonin 0.24, white count 7.7, hemoglobin 13.4, hematocrit 38.5, MCV 93.4, RDW 12.4, platelet count 192, D-dimer 2.94, PT 12.7, INR 1.0 Respiratory viral panel is negative Chest x-ray reviewed by me shows Interstitial thickening with patchy airspace opacities within the perihilar and bibasilar regions suspicious for multifocal atypical/viral pneumonia. CT angiogram of the chest shows signs of multifocal infection concerning for atypical infection as  well as aspiration related changes given the appearance of the left lower lobe findings.  Given more focal consolidative changes and adenopathy in the right chest, will suggest attention on follow-up to ensure resolution.  Emphysema and aortic sclerosis. Twelve-lead EKG reviewed by me shows sinus tachycardia with PACs.  Nonspecific ST and T wave abnormalities.  ED Course: Patient is a 65 year old Caucasian female who is unvaccinated against the COVID-19 virus.  She presents to the ER for evaluation of a 5-day history of worsening shortness of breath, cough productive of clear phlegm, headache, nausea, vomiting, diarrhea and anorexia. She was also hypoxic with room air pulse oximetry of 88% and is currently on 4 L of oxygen to maintain pulse oximetry greater than 92%. She had a positive home COVID-19 test on 05/31 for her PCR on the day of admission is negative. CT angiogram done for elevated D-dimer is negative for PE but is concerning for an atypical infection. Chest x-ray shows bibasilar infiltrates suspicious for multifocal atypical/viral pneumonia. She received empiric antibiotic therapy with Rocephin and Zithromax and will be admitted to the hospital for further evaluation.  Review of Systems: As per HPI otherwise all other systems reviewed and negative.    Past Medical History:  Diagnosis Date  . Anxiety   . Carotid arterial disease ([place])    a. 06/2012 Carotid U/S: 40-50% bilat ICA stenosis, f/u 1 yr.  Marland Kitchen COPD (chronic obstructive pulmonary disease) ([place])   . Depression   . Depression   . Fibromyalgia   . GERD (gastroesophageal reflux disease) 05/28/2013  . HTN (hypertension)    now off all meds  . Hyperlipidemia   . Irritable bowel syndrome   . OA (osteoarthritis)    hips,  back  . Personal history of colonic polyps   . Suicidal behavior    >20 years ago  . Syncope and collapse    a. 01/2009 Echo: EF 55-60%, mildly dil LA.    Past Surgical History:  Procedure Laterality Date   . BREAST BIOPSY Right 2020   benign  . BREAST EXCISIONAL BIOPSY Right 2019  . BREAST SURGERY  2019   benign nodule removed  . CARPAL TUNNEL RELEASE  2007   right  . CERVICAL SPINE SURGERY  2016  . CHOLECYSTECTOMY     Dr. Emilio Math  . COLONOSCOPY  2008   repeat every 5 years,polyps- [place], [place] (03/29/2005-Hyperplastic polyp)  . TUBAL LIGATION  1978  . UPPER GASTROINTESTINAL ENDOSCOPY  6/11   duodenitis,maloney dilated     reports that she has been smoking cigarettes. She has a 21.50 pack-year smoking history. She has never used smokeless tobacco. She reports that she does not drink alcohol and does not use drugs.  Allergies  Allergen Reactions  . Regadenoson Other (See Comments)    Trouble breathing  . Statins     Other reaction(s): Muscle Pain Joint pains  . Milnacipran     REACTION: tachycardia    Family History  Problem Relation Age of Onset  . Hypertension Mother   . Uterine cancer Mother        ischemic bowel; back pain, borderline DM  . Asthma Mother   . Hyperlipidemia Mother   . Coronary artery disease Father   . Heart attack Father   . Alcohol abuse Father   . Hyperlipidemia Father   . Coronary artery disease Sister        first MI at 74, 3 cardiac procedures  . Heart attack Sister   . Crohn's disease Sister   . Colon cancer Maternal Aunt   . Colon cancer Maternal Grandfather   . Thyroid disease Sister        dysfunction  . Breast cancer Neg Hx       Prior to Admission medications   Medication Sig Start Date End Date Taking? Authorizing Provider  Ascorbic Acid (VITAMIN C) 100 MG tablet Take 100 mg by mouth daily.    [provider]  aspirin 81 MG EC tablet Take 81 mg by mouth daily.    [provider]  Budeson-Glycopyrrol-Formoterol (BREZTRI AEROSPHERE) 160-9-4.8 MCG/ACT AERO Inhale 160 mcg into the lungs in the morning and at bedtime. 07/24/20   Tyler Pita, MD  bumetanide (BUMEX) 1 MG tablet Take 1 mg by mouth daily.     [provider]  buPROPion (WELLBUTRIN SR) 150 MG 12 hr tablet TAKE ONE TABLET (150MG ) BY MOUTH EVERY DAY FOR THREE DAYS, THEN INCREASE TO ONE TABLET BY MOUTH TWICE DAILY IF TOLERATED. 08/16/20   Loel Dubonnet, NP  calcium-vitamin D (OSCAL WITH D) 500-200 MG-UNIT tablet Take 1 tablet by mouth.    [provider]  carvedilol (COREG) 12.5 MG tablet Take 12.5 mg by mouth 2 (two) times daily with a meal.    [provider]  co-enzyme Q-10 30 MG capsule Take 100 mg by mouth daily.    [provider]  diclofenac Sodium (VOLTAREN) 1 % GEL Apply topically. 04/05/20   [provider]  ezetimibe (ZETIA) 10 MG tablet Take 1 tablet by mouth daily. 01/05/20   [provider]  levETIRAcetam (KEPPRA) 1000 MG tablet Take 1 tablet by mouth 2 (two) times daily. 04/05/20   [provider]  losartan (COZAAR)  50 MG tablet Take 1 tablet (50 mg total) by mouth daily. 08/01/20 01/28/21  Loel Dubonnet, NP  Magnesium ([employer] TRIPLE MAGNESIUM COMPLEX) 400 MG CAPS Take by mouth 1 day or 1 dose.    [provider]  milk thistle 175 MG tablet Take 1,000 mg by mouth daily.    [provider]  montelukast (SINGULAIR) 10 MG tablet Take 10 mg by mouth at bedtime.    [provider]  pantoprazole (PROTONIX) 40 MG tablet Take 1 tablet (40 mg total) by mouth daily. 01/27/20   Tyler Pita, MD  rOPINIRole (REQUIP) 0.5 MG tablet Take by mouth. 04/05/20   [provider]  tiZANidine (ZANAFLEX) 4 MG tablet Take 1 tablet (4 mg total) by mouth at bedtime. As needed for muscle spasm. 11/08/19   Lesleigh Noe, MD  vitamin B-12 (CYANOCOBALAMIN) 1000 MCG tablet Take 1,000 mcg by mouth daily.    [provider]  zinc gluconate 50 MG tablet Take 50 mg by mouth daily.    [provider]    Physical Exam: Vitals:   08/17/20 1700 08/17/20 1810 08/17/20 1825 08/17/20 1830  BP: (!) 101/56 (!) 108/59  116/63  Pulse: 94 95  91   Resp: (!) 25 (!) 27  (!) 21  Temp:   98.6 F (37 C)   TempSrc:   Oral   SpO2: 97% 98%  98%  Weight:      Height:         Vitals:   08/17/20 1700 08/17/20 1810 08/17/20 1825 08/17/20 1830  BP: (!) 101/56 (!) 108/59  116/63  Pulse: 94 95  91  Resp: (!) 25 (!) 27  (!) 21  Temp:   98.6 F (37 C)   TempSrc:   Oral   SpO2: 97% 98%  98%  Weight:      Height:          Constitutional: Alert and oriented x 3 . Not in any apparent distress. Hoarse voice HEENT:      Head: Normocephalic and atraumatic.         Eyes: PERLA, EOMI, Conjunctivae are normal. Sclera is non-icteric.       Mouth/Throat: Mucous membranes are moist.       Neck: Supple with no signs of meningismus. Cardiovascular: Regular rate and rhythm. No murmurs, gallops, or rubs. 2+ symmetrical distal pulses are present . No JVD. No LE edema Respiratory:  Tachypnea.diffuse wheezes in both lung fields. No crackles, or rhonchi.  Gastrointestinal: Soft, non tender, and non distended with positive bowel sounds.  Genitourinary: No CVA tenderness. Musculoskeletal: Nontender with normal range of motion in all extremities. No cyanosis, or erythema of extremities. Neurologic:  Face is symmetric. Moving all extremities. No gross focal neurologic deficits  Skin: Skin is warm, dry.  No rash or ulcers Psychiatric: Mood and affect are normal   Labs on Admission: I have personally reviewed following labs and imaging studies  CBC: Recent Labs  Lab 08/17/20 1211  WBC 7.7  NEUTROABS 6.1  HGB 13.4  HCT 38.5  MCV 93.4  PLT 128   Basic Metabolic Panel: Recent Labs  Lab 08/17/20 1211 08/17/20 1511  NA 131*  --   K 3.8  --   CL 97*  --   CO2 22  --   GLUCOSE 131*  --   BUN 22  --   CREATININE 0.97  --   CALCIUM 8.7*  --   MG  --  2.1  GFR: Estimated Creatinine Clearance: 57.8 mL/min (by C-G formula based on SCr of 0.97 mg/dL). Liver Function Tests: Recent Labs  Lab 08/17/20 1211  AST 32  ALT 21  ALKPHOS 51   BILITOT 0.7  PROT 7.4  ALBUMIN 4.0   No results for input(s): LIPASE, AMYLASE in the last 168 hours. No results for input(s): AMMONIA in the last 168 hours. Coagulation Profile: Recent Labs  Lab 08/17/20 1511  INR 1.0   Cardiac Enzymes: No results for input(s): CKTOTAL, CKMB, CKMBINDEX, TROPONINI in the last 168 hours. BNP (last 3 results) No results for input(s): PROBNP in the last 8760 hours. HbA1C: No results for input(s): HGBA1C in the last 72 hours. CBG: No results for input(s): GLUCAP in the last 168 hours. Lipid Profile: No results for input(s): CHOL, HDL, LDLCALC, TRIG, CHOLHDL, LDLDIRECT in the last 72 hours. Thyroid Function Tests: No results for input(s): TSH, T4TOTAL, FREET4, T3FREE, THYROIDAB in the last 72 hours. Anemia Panel: No results for input(s): VITAMINB12, FOLATE, FERRITIN, TIBC, IRON, RETICCTPCT in the last 72 hours. Urine analysis:    Component Value Date/Time   COLORURINE Straw 10/03/2013 2333   COLORURINE yellow 09/25/2009 1503   APPEARANCEUR Clear 10/03/2013 2333   LABSPEC 1.004 10/03/2013 2333   PHURINE 7.0 10/03/2013 2333   PHURINE 6.0 09/25/2009 1503   GLUCOSEU Negative 10/03/2013 2333   HGBUR Negative 10/03/2013 2333   HGBUR negative 09/25/2009 1503   BILIRUBINUR Negative 10/03/2013 2333   KETONESUR Negative 10/03/2013 2333   PROTEINUR Negative 10/03/2013 2333   UROBILINOGEN 0.2 10/30/2012 0949   UROBILINOGEN 0.2 09/25/2009 1503   NITRITE Negative 10/03/2013 2333   NITRITE negative 09/25/2009 1503   LEUKOCYTESUR Negative 10/03/2013 2333    Radiological Exams on Admission: CT Angio Chest PE W and/or Wo Contrast  Result Date: 08/17/2020 CLINICAL DATA:  Suspected pulmonary embolism in a 66 year old female with infiltrate on chest x-ray and shortness of breath. EXAM: CT ANGIOGRAPHY CHEST WITH CONTRAST TECHNIQUE: Multidetector CT imaging of the chest was performed using the standard protocol during bolus administration of intravenous  contrast. Multiplanar CT image reconstructions and MIPs were obtained to evaluate the vascular anatomy. CONTRAST:  2mL OMNIPAQUE IOHEXOL 350 MG/ML SOLN COMPARISON:  None. FINDINGS: Cardiovascular: Calcified atheromatous plaque of the thoracic aorta. Normal caliber of the thoracic aorta. Normal heart size. Normal caliber of central pulmonary vessels. Density of the main pulmonary artery at 334 Hounsfield units. No evidence of pulmonary embolism, study adequate for evaluation with mild limitation with respect to subsegmental vessels in the lung bases. Mediastinum/Nodes: RIGHT hilar lymph node at 1.6 cm (image 44/4) Mildly enlarged lymph nodes along the RIGHT paratracheal chain largest approximately 11 mm. Mild fullness of subcarinal nodal tissue and LEFT hilar nodal tissue. No thoracic inlet lymphadenopathy. No axillary lymphadenopathy. Esophagus grossly normal. Lungs/Pleura: Diffuse peribronchovascular nodularity and ground-glass. More focal area of consolidation in the anterior RIGHT upper lobe on image 56 of series 6. Scattered nodules throughout the chest none of which were present just over 6 weeks ago. Background pulmonary emphysema. Early consolidative changes at the LEFT lung base as well with predominant feature in this area bronchovascular nodularity and tree-in-bud opacities. No sign of effusion. Upper Abdomen: Incidental imaging of upper abdominal contents without acute process. Musculoskeletal: No acute musculoskeletal findings. Spinal degenerative changes. Review of the MIP images confirms the above findings. IMPRESSION: 1. No evidence of pulmonary embolism. 2. Signs of multifocal infection, developed in the interval since imaging from April. Would consider atypical infectious etiologies as well as  aspiration related changes given the appearance of LEFT lower lobe findings. 3. Given more focal consolidative changes and adenopathy in the RIGHT chest would suggest attention on follow-up to ensure  resolution. 4. Emphysema and aortic atherosclerosis. Aortic Atherosclerosis (ICD10-I70.0) and Emphysema (ICD10-J43.9). Electronically Signed   By: Zetta Bills M.D.   On: 08/17/2020 17:55   DG Chest Port 1 View  Result Date: 08/17/2020 CLINICAL DATA:  COVID-19 positive, shortness of breath, cough EXAM: PORTABLE CHEST 1 VIEW COMPARISON:  06/27/2020 FINDINGS: Cardiomediastinal contours are within normal limits. Atherosclerotic calcification of the aortic knob. Interstitial thickening with patchy airspace opacities within the perihilar and bibasilar regions. No pleural effusion or pneumothorax. IMPRESSION: Interstitial thickening with patchy airspace opacities within the perihilar and bibasilar regions suspicious for multifocal atypical/viral pneumonia. Electronically Signed   By: Davina Poke D.O.   On: 08/17/2020 14:55     Assessment/Plan Principal Problem:   Pneumonia due to COVID-19 virus Active Problems:   TOBACCO ABUSE   COPD with acute exacerbation (HCC)   GERD (gastroesophageal reflux disease)   Hypertension   Anxiety   Seizure disorder (HCC)   Chronic diastolic CHF (congestive heart failure) (HCC)   Hyponatremia      Pneumonia due to COVID-19 virus as well as acute respiratory failure Patient is an unvaccinated female who presents to the emergency room for evaluation of a 5-day history of shortness of breath, cough productive of clear phlegm, headache, anorexia, nausea, vomiting and diarrhea. She had a positive home COVID-19 test on 08/15/20 but a negative PCR test on admission. Place patient on respiratory and contact isolation Patient had room air pulse oximetry of 88% and is currently on 4L of oxygen to maintain pulse oximetry of greater than 92%. We will place patient on remdesivir per protocol Start patient on Solu-Medrol 1 mg/kg daily Will place patient empirically on antibiotic therapy with Rocephin and Zithromax for superimposed bacterial infection Supportive care  with bronchodilator therapy, antitussive's and vitamins Will monitor serial inflammatory markers      History of chronic diastolic CHF Stable. Not acutely exacerbated Continue Carvedilol, Bumex and Losartan    Seizure Disorder Continue Keppra Place patient on seizure precautions    COPD with acute exacerbation Patient has a history of COPD as well as nicotine dependence She has diffuse wheezing and a cough productive of clear phlegm Continue bronchodilator therapy q 4 hours, inhaled steroids as well as systemic steroids.     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 All questions and concerns have been addressed. She verbalizes understanding and agrees with the plan. Disposition Plan: Back to previous home environment Consults called: none Status: At the time of admission, it appears that the appropriate admission status for this patient is inpatient. This is judged to be reasonable and necessary in order to provide the required intensity of service to ensure the patient's safety given the presenting symptoms, physical exam findings and initial radiographic and laboratory data in the context of the comorbid conditions. Patient requires inpatient status due to high intensity of service, high risk for further deterioration and high frequency of surveillance required.     Collier Bullock MD [facility]     08/17/2020, 7:18 PM

## 2020-08-17 NOTE — ED Triage Notes (Signed)
Pt comes into the ED via [facility] from home c/o COVID positive from a home test on Saturday.  Pt c/o [facility] when EMS called.  88% RA, 94-95% on 4L.  Pt has no taste or smell.  Pt given Atrovent and albuterol neb.  H/o COPD.  156/70, Hr 90, 24 RR,

## 2020-08-17 NOTE — ED Provider Notes (Signed)
Patient received in signout from Dr. Hulan Saas pending remainder of blood work and plan for medical admission.  Her D-dimer returns positive, and CTA chest demonstrates no evidence of acute PE.  Surprisingly, her COVID-19 PCR swab returns as negative, despite her clinical syndrome consistent with COVID-19 and home positive antigen test earlier this week.  Initial vitals meet sepsis criteria, but blood work is quite reassuring and with a marginally elevated procalcitonin level.  CAP coverage provided and patient reports improved symptoms after albuterol MDI.  We will admit to hospitalist medicine for further work-up and management of her respiratory condition.  Clinical Course as of 08/17/20 2023  Thu Aug 17, 2020  1540 Patient received in signout at 3 PM from Dr. Hulan Saas pending blood work, including D-dimer, prior to expected admission for superimposed bacterial pneumonia on top of COVID-19, sepsis criteria and possible PE.  I go to the bedside and assessed the patient, she is sitting upright in bed, tachypneic and on new supplemental oxygen via nasal cannula.  We discussed plans for admission, D-dimer testing and possible CT scan of her chest.  She is agreeable.  Updated nurse on plan of care [DS]    Clinical Course User Index [DS] Vladimir Crofts, MD   .Critical Care Performed by: Vladimir Crofts, MD Authorized by: Vladimir Crofts, MD   Critical care provider statement:    Critical care time (minutes):  35   Critical care was necessary to treat or prevent imminent or life-threatening deterioration of the following conditions:  Respiratory failure   Critical care was time spent personally by me on the following activities:  Discussions with consultants, evaluation of patient's response to treatment, examination of patient, ordering and performing treatments and interventions, ordering and review of laboratory studies, ordering and review of radiographic studies, pulse oximetry, re-evaluation  of patient's condition, obtaining history from patient or surrogate and review of old charts .1-3 Lead EKG Interpretation Performed by: Vladimir Crofts, MD Authorized by: Vladimir Crofts, MD     Interpretation: normal     ECG rate:  92   ECG rate assessment: normal     Rhythm: sinus rhythm     Ectopy: none     Conduction: normal         Vladimir Crofts, MD 08/17/20 2024

## 2020-08-17 NOTE — ED Notes (Signed)
Pt reports positive COVID-19 at home test on Tuesday of this week.

## 2020-08-17 NOTE — Consult Note (Signed)
Remdesivir - Pharmacy Brief Note   O:  ALT: 21 CXR: Interstitial thickening with patchy airspace opacities within the perihilar and bibasilar regions suspicious for multifocal atypical/viral pneumonia. SpO2: 96% on 4LNC   A/P:  Remdesivir 200 mg IVPB once followed by 100 mg IVPB daily x 4 days.   Pearla Dubonnet, PharmD Clinical Pharmacist 08/17/2020 7:36 PM

## 2020-08-18 LAB — CBC WITH DIFFERENTIAL/PLATELET
Abs Immature Granulocytes: 0.08 10*3/uL — ABNORMAL HIGH (ref 0.00–0.07)
Basophils Absolute: 0 10*3/uL (ref 0.0–0.1)
Basophils Relative: 0 %
Eosinophils Absolute: 0 10*3/uL (ref 0.0–0.5)
Eosinophils Relative: 0 %
HCT: 35.7 % — ABNORMAL LOW (ref 36.0–46.0)
Hemoglobin: 12.7 g/dL (ref 12.0–15.0)
Immature Granulocytes: 1 %
Lymphocytes Relative: 14 %
Lymphs Abs: 0.8 10*3/uL (ref 0.7–4.0)
MCH: 33.2 pg (ref 26.0–34.0)
MCHC: 35.6 g/dL (ref 30.0–36.0)
MCV: 93.5 fL (ref 80.0–100.0)
Monocytes Absolute: 0.1 10*3/uL (ref 0.1–1.0)
Monocytes Relative: 1 %
Neutro Abs: 5.3 10*3/uL (ref 1.7–7.7)
Neutrophils Relative %: 84 %
Platelets: 194 10*3/uL (ref 150–400)
RBC: 3.82 MIL/uL — ABNORMAL LOW (ref 3.87–5.11)
RDW: 12.4 % (ref 11.5–15.5)
Smear Review: NORMAL
WBC: 6.2 10*3/uL (ref 4.0–10.5)
nRBC: 0 % (ref 0.0–0.2)

## 2020-08-18 LAB — D-DIMER, QUANTITATIVE: D-Dimer, Quant: 2.61 ug/mL-FEU — ABNORMAL HIGH (ref 0.00–0.50)

## 2020-08-18 LAB — COMPREHENSIVE METABOLIC PANEL
ALT: 20 U/L (ref 0–44)
AST: 29 U/L (ref 15–41)
Albumin: 3.5 g/dL (ref 3.5–5.0)
Alkaline Phosphatase: 47 U/L (ref 38–126)
Anion gap: 9 (ref 5–15)
BUN: 23 mg/dL (ref 8–23)
CO2: 25 mmol/L (ref 22–32)
Calcium: 8.3 mg/dL — ABNORMAL LOW (ref 8.9–10.3)
Chloride: 102 mmol/L (ref 98–111)
Creatinine, Ser: 0.79 mg/dL (ref 0.44–1.00)
GFR, Estimated: >60 mL/min (ref >60)
Glucose, Bld: 147 mg/dL — ABNORMAL HIGH (ref 70–99)
Potassium: 4.2 mmol/L (ref 3.5–5.1)
Sodium: 136 mmol/L (ref 135–145)
Total Bilirubin: 0.4 mg/dL (ref 0.3–1.2)
Total Protein: 6.7 g/dL (ref 6.5–8.1)

## 2020-08-18 LAB — C-REACTIVE PROTEIN: CRP: 15.7 mg/dL — ABNORMAL HIGH (ref <1.0)

## 2020-08-18 LAB — HIV ANTIBODY (ROUTINE TESTING W REFLEX): HIV Screen 4th Generation wRfx: NONREACTIVE

## 2020-08-18 LAB — MAGNESIUM: Magnesium: 2.2 mg/dL (ref 1.7–2.4)

## 2020-08-18 LAB — PHOSPHORUS: Phosphorus: 3.7 mg/dL (ref 2.5–4.6)

## 2020-08-18 LAB — FERRITIN: Ferritin: 422 ng/mL — ABNORMAL HIGH (ref 11–307)

## 2020-08-18 MED ORDER — UMECLIDINIUM BROMIDE 62.5 MCG/INH IN AEPB
1.0000 | INHALATION_SPRAY | Freq: Every day | RESPIRATORY_TRACT | Status: DC
  Administered 2020-08-18 – 2020-08-21 (×4): 1 via RESPIRATORY_TRACT
  Filled 2020-08-18: qty 7

## 2020-08-18 MED ORDER — ACETAMINOPHEN 325 MG PO TABS
650.0000 mg | ORAL_TABLET | Freq: Four times a day (QID) | ORAL | Status: DC | PRN
  Administered 2020-08-18 – 2020-08-21 (×2): 650 mg via ORAL
  Filled 2020-08-18: qty 2

## 2020-08-18 MED ORDER — MOMETASONE FURO-FORMOTEROL FUM 200-5 MCG/ACT IN AERO
2.0000 | INHALATION_SPRAY | Freq: Two times a day (BID) | RESPIRATORY_TRACT | Status: DC
  Administered 2020-08-18 – 2020-08-21 (×7): 2 via RESPIRATORY_TRACT
  Filled 2020-08-18: qty 8.8

## 2020-08-18 MED ORDER — SALINE SPRAY 0.65 % NA SOLN
1.0000 | NASAL | Status: DC | PRN
  Administered 2020-08-18 – 2020-08-20 (×3): 1 via NASAL
  Filled 2020-08-18 (×2): qty 44

## 2020-08-18 MED ORDER — OXYCODONE HCL 5 MG PO TABS
5.0000 mg | ORAL_TABLET | Freq: Four times a day (QID) | ORAL | Status: DC | PRN
  Administered 2020-08-18: 5 mg via ORAL
  Filled 2020-08-18: qty 1

## 2020-08-18 NOTE — Plan of Care (Signed)
  Problem: Clinical Measurements: Goal: Respiratory complications will improve Outcome: Progressing   Problem: Coping: Goal: Psychosocial and spiritual needs will be supported Outcome: Progressing   Problem: Respiratory: Goal: Will maintain a patent airway Outcome: Progressing Goal: Complications related to the disease process, condition or treatment will be avoided or minimized Outcome: Progressing

## 2020-08-18 NOTE — Progress Notes (Addendum)
PROGRESS NOTE    Lindsay Morse  ZHG:[id number] DOB: 1955/04/12 DOA: 08/17/2020 PCP: Gladstone Lighter, MD     Brief Narrative:  Lindsay Morse is a 65 y.o. female with medical history significant for depression, GERD, anxiety, hypertension, COPD, nicotine dependence, chronic diastolic dysfunction CHF, chronic respiratory failure, seizure who presents to the emergency for evaluation of shortness of breath, cough productive of clear phlegm, fever, nausea, vomiting and diarrhea since Saturday 5/28. She took a home COVID test on 5/29 which was negative, then another test on 5/31 which was positive.  In the emergency department, patient's COVID PCR was negative.  Chest x-ray revealed interstitial thickening with patchy airspace opacities,.  Hilar and bibasilar regions suspicious for multifocal atypical/viral pneumonia.  Patient underwent CTA chest which was negative for PE but was consistent with multifocal infection, possibly aspiration as well.  Patient was admitted to the hospital, started on Rocephin, azithromycin, Solu-Medrol and Remdesivir.  New events last 24 hours / Subjective: States that her breathing has not improved, continues to be short of breath, dry cough, headache.  She does not normally wear oxygen at home, is on 6 L here.  She states that she has been smoking about a pack a cigarettes per day for many years, has recently been trying to quit was started on Wellbutrin.  Admits to chest pain with cough, some nausea, diarrhea.  Assessment & Plan:   Principal Problem:   Pneumonia due to COVID-19 virus Active Problems:   TOBACCO ABUSE   COPD with acute exacerbation (HCC)   GERD (gastroesophageal reflux disease)   Hypertension   Anxiety   Seizure disorder (HCC)   Chronic diastolic CHF (congestive heart failure) (HCC)   Hyponatremia   Acute hypoxemic respiratory failure secondary to COVID-19  -In the ED, SpO2 found to be 88% on room air -Continue 6 L O2, wean as able  COVID-19  Pneumonia -Tested positive by home test on 5/31, negative PCR on day of admission 6/2 -Continue Remdesivir 6/2 >> -Continue steroids 6/2 >> -Continue azithromycin/rocephin 6/2 >>  -Supportive treatments as ordered, encourage IS, encourage prone positioning, encourage mobilization   COVID-19 Labs  Recent Labs    08/17/20 1511 08/18/20 0544  DDIMER 2.94* 2.61*  FERRITIN  --  422*    Lab Results  Component Value Date   SARSCOV2NAA NEGATIVE 08/17/2020   COPD -Continue inhalers  Hypertension -Continue Coreg, Cozaar  Seizure disorder -Continue Keppra  Tobacco abuse  -Cessation counseling. Was started on Wellbutrin as an outpatient   DVT prophylaxis:  enoxaparin (LOVENOX) injection 40 mg Start: 08/17/20 2200 Code Status:  Discussed CODE STATUS with patient, RN at bedside today.  Patient requesting to be a DNR status, states that she would not want aggressive and heroic measures in case of decompensation, does not want intubation, mechanical ventilation even if it meant sustaining her life.  Patient's CODE STATUS changed to DNR today.    Code Status Orders  (From admission, onward)         Start     Ordered   08/18/20 0943  Do not attempt resuscitation (DNR)  Continuous       Question Answer Comment  In the event of cardiac or respiratory ARREST Do not call a "code blue"   In the event of cardiac or respiratory ARREST Do not perform Intubation, CPR, defibrillation or ACLS   In the event of cardiac or respiratory ARREST Use medication by any route, position, wound care, and other measures to relive pain and suffering. May  use oxygen, suction and manual treatment of airway obstruction as needed for comfort.      08/18/20 1610        Code Status History    Date Active Date Inactive Code Status Order ID Comments User Context   08/17/2020 1900 08/18/2020 0942 Full Code [id number]  Collier Bullock, MD ED   Advance Care Planning Activity     Family Communication: No family  at bedside; called son in the afternoon to give update, but no answer and could not leave voicemail Disposition Plan:  Status is: Inpatient  Remains inpatient appropriate because:IV treatments appropriate due to intensity of illness or inability to take PO and Inpatient level of care appropriate due to severity of illness   Dispo: The patient is from: Home              Anticipated d/c is to: Home              Patient currently is not medically stable to d/c.   Difficult to place patient No      Consultants:   None  Procedures:   None  Antimicrobials:  Anti-infectives (From admission, onward)   Start     Dose/Rate Route Frequency Ordered Stop   08/18/20 1000  remdesivir 100 mg in sodium chloride 0.9 % 100 mL IVPB       "Followed by" Linked Group Details   100 mg 200 mL/hr over 30 Minutes Intravenous Daily 08/17/20 1900 08/22/20 0959   08/17/20 2000  remdesivir 200 mg in sodium chloride 0.9% 250 mL IVPB       "Followed by" Linked Group Details   200 mg 580 mL/hr over 30 Minutes Intravenous Once 08/17/20 1900 08/17/20 2328   08/17/20 1915  cefTRIAXone (ROCEPHIN) 2 g in sodium chloride 0.9 % 100 mL IVPB        2 g 200 mL/hr over 30 Minutes Intravenous Every 24 hours 08/17/20 1901 08/22/20 1914   08/17/20 1915  azithromycin (ZITHROMAX) 500 mg in sodium chloride 0.9 % 250 mL IVPB        500 mg 250 mL/hr over 60 Minutes Intravenous Every 24 hours 08/17/20 1901 08/22/20 1914   08/17/20 1445  cefTRIAXone (ROCEPHIN) 1 g in sodium chloride 0.9 % 100 mL IVPB        1 g 200 mL/hr over 30 Minutes Intravenous  Once 08/17/20 1443 08/17/20 1604   08/17/20 1445  azithromycin (ZITHROMAX) 500 mg in sodium chloride 0.9 % 250 mL IVPB        500 mg 250 mL/hr over 60 Minutes Intravenous  Once 08/17/20 1443 08/17/20 1647        Objective: Vitals:   08/18/20 0104 08/18/20 0205 08/18/20 0611 08/18/20 0922  BP: 124/64 98/72 118/71 135/81  Pulse: (!) 114 (!) 118 (!) 101 (!) 107  Resp: (!)  26 (!) 25 (!) 24 (!) 22  Temp: 100.3 F (37.9 C) 99 F (37.2 C) 98.7 F (37.1 C) 98.8 F (37.1 C)  TempSrc: Oral Oral  Oral  SpO2: 93% 95% 94% 94%  Weight:      Height:        Intake/Output Summary (Last 24 hours) at 08/18/2020 1001 Last data filed at 08/18/2020 0600 Gross per 24 hour  Intake 1950.96 ml  Output 250 ml  Net 1700.96 ml   Filed Weights   08/17/20 1201  Weight: 82.6 kg    Examination:  General exam: Appears anxious Respiratory system: Diminished breath sounds without rhonchi or wheeze,  on 6 L oxygen Cardiovascular system: S1 & S2 heard, tachycardic, regular rhythm. No murmurs. No pedal edema. Gastrointestinal system: Abdomen is nondistended, soft and nontender. Normal bowel sounds heard. Central nervous system: Alert and oriented. No focal neurological deficits. Speech clear.  Extremities: Symmetric in appearance  Skin: No rashes, lesions or ulcers on exposed skin  Psychiatry: Judgement and insight appear normal. Mood & affect appropriate.   Data Reviewed: I have personally reviewed following labs and imaging studies  CBC: Recent Labs  Lab 08/17/20 1211 08/18/20 0544  WBC 7.7 6.2  NEUTROABS 6.1 5.3  HGB 13.4 12.7  HCT 38.5 35.7*  MCV 93.4 93.5  PLT 192 643   Basic Metabolic Panel: Recent Labs  Lab 08/17/20 1211 08/17/20 1511 08/18/20 0544  NA 131*  --  136  K 3.8  --  4.2  CL 97*  --  102  CO2 22  --  25  GLUCOSE 131*  --  147*  BUN 22  --  23  CREATININE 0.97  --  0.79  CALCIUM 8.7*  --  8.3*  MG  --  2.1 2.2  PHOS  --   --  3.7   GFR: Estimated Creatinine Clearance: 70.1 mL/min (by C-G formula based on SCr of 0.79 mg/dL). Liver Function Tests: Recent Labs  Lab 08/17/20 1211 08/18/20 0544  AST 32 29  ALT 21 20  ALKPHOS 51 47  BILITOT 0.7 0.4  PROT 7.4 6.7  ALBUMIN 4.0 3.5   No results for input(s): LIPASE, AMYLASE in the last 168 hours. No results for input(s): AMMONIA in the last 168 hours. Coagulation Profile: Recent Labs   Lab 08/17/20 1511  INR 1.0   Cardiac Enzymes: No results for input(s): CKTOTAL, CKMB, CKMBINDEX, TROPONINI in the last 168 hours. BNP (last 3 results) No results for input(s): PROBNP in the last 8760 hours. HbA1C: No results for input(s): HGBA1C in the last 72 hours. CBG: No results for input(s): GLUCAP in the last 168 hours. Lipid Profile: No results for input(s): CHOL, HDL, LDLCALC, TRIG, CHOLHDL, LDLDIRECT in the last 72 hours. Thyroid Function Tests: No results for input(s): TSH, T4TOTAL, FREET4, T3FREE, THYROIDAB in the last 72 hours. Anemia Panel: Recent Labs    08/18/20 0544  FERRITIN 422*   Sepsis Labs: Recent Labs  Lab 08/17/20 1211 08/17/20 1511 08/17/20 1809  PROCALCITON 0.24  --   --   LATICACIDVEN  --  1.1 1.2    Recent Results (from the past 240 hour(s))  Blood culture (routine single)     Status: None (Preliminary result)   Collection Time: 08/17/20  3:11 PM   Specimen: BLOOD  Result Value Ref Range Status   Specimen Description BLOOD RIGHT ANTECUBITAL  Final   Special Requests   Final    BOTTLES DRAWN AEROBIC AND ANAEROBIC Blood Culture results may not be optimal due to an inadequate volume of blood received in culture bottles   Culture   Final    NO GROWTH < 24 HOURS Performed at [facility], [address], [place], [place] [address]    Report Status PENDING  Incomplete  Resp Panel by RT-PCR (Flu A&B, Covid) Nasopharyngeal Swab     Status: None   Collection Time: 08/17/20  3:11 PM   Specimen: Nasopharyngeal Swab; Nasopharyngeal(NP) swabs in vial transport medium  Result Value Ref Range Status   SARS Coronavirus 2 by RT PCR NEGATIVE NEGATIVE Final    Comment: (NOTE) SARS-CoV-2 target nucleic acids are NOT DETECTED.  The SARS-CoV-2 RNA is generally detectable in upper respiratory specimens during the acute phase of infection. The lowest concentration of SARS-CoV-2 viral copies this assay can detect is 138 copies/mL. A negative  result does not preclude SARS-Cov-2 infection and should not be used as the sole basis for treatment or other patient management decisions. A negative result may occur with  improper specimen collection/handling, submission of specimen other than nasopharyngeal swab, presence of viral mutation(s) within the areas targeted by this assay, and inadequate number of viral copies(<138 copies/mL). A negative result must be combined with clinical observations, patient history, and epidemiological information. The expected result is Negative.  Fact Sheet for Patients:  EntrepreneurPulse.com.au  Fact Sheet for Healthcare Providers:  IncredibleEmployment.be  This test is no t yet approved or cleared by the [place] FDA and  has been authorized for detection and/or diagnosis of SARS-CoV-2 by FDA under an Emergency Use Authorization (EUA). This EUA will remain  in effect (meaning this test can be used) for the duration of the COVID-19 declaration under Section 564(b)(1) of the Act, 21 U.S.C.section 360bbb-3(b)(1), unless the authorization is terminated  or revoked sooner.       Influenza A by PCR NEGATIVE NEGATIVE Final   Influenza B by PCR NEGATIVE NEGATIVE Final    Comment: (NOTE) The Xpert Xpress SARS-CoV-2/FLU/RSV plus assay is intended as an aid in the diagnosis of influenza from Nasopharyngeal swab specimens and should not be used as a sole basis for treatment. Nasal washings and aspirates are unacceptable for Xpert Xpress SARS-CoV-2/FLU/RSV testing.  Fact Sheet for Patients: EntrepreneurPulse.com.au  Fact Sheet for Healthcare Providers: IncredibleEmployment.be  This test is not yet approved or cleared by the [place] FDA and has been authorized for detection and/or diagnosis of SARS-CoV-2 by FDA under an Emergency Use Authorization (EUA). This EUA will remain in effect (meaning this test can be used)  for the duration of the COVID-19 declaration under Section 564(b)(1) of the Act, 21 U.S.C. section 360bbb-3(b)(1), unless the authorization is terminated or revoked.  Performed at [facility], [address] [place], [place] [address]       Radiology Studies: CT Angio Chest PE W and/or Wo Contrast  Result Date: 08/17/2020 CLINICAL DATA:  Suspected pulmonary embolism in a 65 year old female with infiltrate on chest x-ray and shortness of breath. EXAM: CT ANGIOGRAPHY CHEST WITH CONTRAST TECHNIQUE: Multidetector CT imaging of the chest was performed using the standard protocol during bolus administration of intravenous contrast. Multiplanar CT image reconstructions and MIPs were obtained to evaluate the vascular anatomy. CONTRAST:  49mL OMNIPAQUE IOHEXOL 350 MG/ML SOLN COMPARISON:  None. FINDINGS: Cardiovascular: Calcified atheromatous plaque of the thoracic aorta. Normal caliber of the thoracic aorta. Normal heart size. Normal caliber of central pulmonary vessels. Density of the main pulmonary artery at 334 Hounsfield units. No evidence of pulmonary embolism, study adequate for evaluation with mild limitation with respect to subsegmental vessels in the lung bases. Mediastinum/Nodes: RIGHT hilar lymph node at 1.6 cm (image 44/4) Mildly enlarged lymph nodes along the RIGHT paratracheal chain largest approximately 11 mm. Mild fullness of subcarinal nodal tissue and LEFT hilar nodal tissue. No thoracic inlet lymphadenopathy. No axillary lymphadenopathy. Esophagus grossly normal. Lungs/Pleura: Diffuse peribronchovascular nodularity and ground-glass. More focal area of consolidation in the anterior RIGHT upper lobe on image 56 of series 6. Scattered nodules throughout the chest none of which were present just over 6 weeks ago. Background pulmonary emphysema. Early consolidative changes at the LEFT lung base as well with predominant  feature in this area bronchovascular nodularity and tree-in-bud  opacities. No sign of effusion. Upper Abdomen: Incidental imaging of upper abdominal contents without acute process. Musculoskeletal: No acute musculoskeletal findings. Spinal degenerative changes. Review of the MIP images confirms the above findings. IMPRESSION: 1. No evidence of pulmonary embolism. 2. Signs of multifocal infection, developed in the interval since imaging from April. Would consider atypical infectious etiologies as well as aspiration related changes given the appearance of LEFT lower lobe findings. 3. Given more focal consolidative changes and adenopathy in the RIGHT chest would suggest attention on follow-up to ensure resolution. 4. Emphysema and aortic atherosclerosis. Aortic Atherosclerosis (ICD10-I70.0) and Emphysema (ICD10-J43.9). Electronically Signed   By: Zetta Bills M.D.   On: 08/17/2020 17:55   DG Chest Port 1 View  Result Date: 08/17/2020 CLINICAL DATA:  COVID-19 positive, shortness of breath, cough EXAM: PORTABLE CHEST 1 VIEW COMPARISON:  06/27/2020 FINDINGS: Cardiomediastinal contours are within normal limits. Atherosclerotic calcification of the aortic knob. Interstitial thickening with patchy airspace opacities within the perihilar and bibasilar regions. No pleural effusion or pneumothorax. IMPRESSION: Interstitial thickening with patchy airspace opacities within the perihilar and bibasilar regions suspicious for multifocal atypical/viral pneumonia. Electronically Signed   By: Davina Poke D.O.   On: 08/17/2020 14:55      Scheduled Meds: . albuterol  2 puff Inhalation Q4H  . aspirin EC  81 mg Oral Daily  . calcium-vitamin D  1 tablet Oral Q breakfast  . carvedilol  12.5 mg Oral BID WC  . enoxaparin (LOVENOX) injection  40 mg Subcutaneous Q24H  . ezetimibe  10 mg Oral Daily  . levETIRAcetam  1,000 mg Oral BID  . losartan  50 mg Oral Daily  . methylPREDNISolone (SOLU-MEDROL) injection  1 mg/kg Intravenous Q12H   Followed by  . [START ON 08/20/2020] predniSONE   50 mg Oral Daily  . mometasone-formoterol  2 puff Inhalation BID  . montelukast  10 mg Oral QHS  . pantoprazole  40 mg Oral Daily  . rOPINIRole  0.5 mg Oral TID  . tiZANidine  4 mg Oral QHS  . umeclidinium bromide  1 puff Inhalation Daily  . vitamin B-12  1,000 mcg Oral Daily  . zinc sulfate  220 mg Oral Daily   Continuous Infusions: . sodium chloride 250 mL (08/17/20 2302)  . azithromycin 500 mg (08/17/20 2307)  . cefTRIAXone (ROCEPHIN)  IV 2 g (08/17/20 2350)  . remdesivir 100 mg in NS 100 mL 100 mg (08/18/20 0952)     LOS: 1 day      Time spent: 35 minutes   Dessa Phi, DO [facility] 08/18/2020, 10:01 AM   Available via Epic secure chat 7am-7pm After these hours, please refer to coverage provider listed on [email or web address]

## 2020-08-18 NOTE — Progress Notes (Signed)
   08/18/20 0720  Clinical Encounter Type  Visited With Patient  Visit Type Initial  Referral From Nurse  Consult/Referral To Chaplain  Spiritual Encounters  Spiritual Needs Brochure  Chaplain Jarrid Lienhard responded to an OR for room 2C-202A, Pt Lindsay Morse. The OR was for an AD. I notified the nurses station that I am leaving one pamphlet and they can give it to the Pt at a later time. Pt is on Isolation precaution due to covid and pneumonia.

## 2020-08-18 NOTE — Plan of Care (Signed)
  Problem: Clinical Measurements: Goal: Respiratory complications will improve Outcome: Not Progressing Note: Patient profile completed. Patient is tachypniec. Rhonci heard throughout her lungs and productive cough. Patient complains of soreness. Skin intact.

## 2020-08-18 NOTE — Evaluation (Signed)
Physical Therapy Evaluation Patient Details Name: Lindsay Morse MRN: [id number] DOB: 04-12-1955 Today's Date: 08/18/2020   History of Present Illness  Pt is a 65 y.o. female with medical history significant for depression, GERD, anxiety, hypertension, COPD, nicotine dependence, chronic diastolic dysfunction CHF, chronic respiratory failure, seizure who presents to the emergency for evaluation of shortness of breath, cough productive of clear phlegm, fever, nausea, vomiting and diarrhea since Saturday 5/28. She took a home COVID test on 5/29 which was negative, then another test on 5/31 which was positive, PCR test here negative.    Clinical Impression  Patient alert, oriented x4. Reported generalized body aches. Stated at baseline she is independent, lives in a first floor apartment.   She was able to perform supine to sit with supervision and use of bed rails. Fair sitting balance noted, pt more comfortable with BUE support. Sit <> Stand with RW and CGA, she was able to stand and pivot to recliner in room. Pt exhibited fatigue and dyspnea, further mobility deferred. Pt on 6L via [place] throughout session, spO2 89-94%. Pt also instructed in PLB.  Overall the patient demonstrated deficits (see "PT Problem List") that impede the patient's functional abilities, safety, and mobility and would benefit from skilled PT intervention. Recommendation is HHPT with supervision intermittently pending further pt progress.     Follow Up Recommendations Home health PT;Supervision - Intermittent    Equipment Recommendations  Rolling walker with 5" wheels    Recommendations for Other Services       Precautions / Restrictions Precautions Precautions: Fall Restrictions Weight Bearing Restrictions: No      Mobility  Bed Mobility Overal bed mobility: Needs Assistance Bed Mobility: Supine to Sit     Supine to sit: Supervision          Transfers Overall transfer level: Needs assistance Equipment used:  Rolling walker (2 wheeled) Transfers: Sit to/from Stand Sit to Stand: Min guard            Ambulation/Gait Ambulation/Gait assistance: Min guard Gait Distance (Feet): 2 Feet Assistive device: Rolling walker (2 wheeled)       General Gait Details: pt fatigued, sleepy after taking several steps to the recliner, further mobility deferred  Stairs            Wheelchair Mobility    Modified Rankin (Stroke Patients Only)       Balance Overall balance assessment: Needs assistance Sitting-balance support: Feet supported Sitting balance-Leahy Scale: Good       Standing balance-Leahy Scale: Fair Standing balance comment: more comfortable with UE support                             Pertinent Vitals/Pain Pain Assessment: Faces Faces Pain Scale: Hurts a little bit Pain Location: pt reported generalized body aches, and feeling like she was "raw" inside from all the coughing Pain Descriptors / Indicators: Aching Pain Intervention(s): Limited activity within patient's tolerance;Monitored during session;Repositioned    Home Living Family/patient expects to be discharged to:: Private residence Living Arrangements: Alone Available Help at Discharge: Family;Available PRN/intermittently Type of Home: Apartment Home Access: Level entry     Home Layout: One level Home Equipment: None      Prior Function Level of Independence: Independent               Hand Dominance        Extremity/Trunk Assessment   Upper Extremity Assessment Upper Extremity Assessment: Generalized weakness  Lower Extremity Assessment Lower Extremity Assessment: Generalized weakness    Cervical / Trunk Assessment Cervical / Trunk Assessment: Normal  Communication   Communication: No difficulties  Cognition Arousal/Alertness: Awake/alert Behavior During Therapy: WFL for tasks assessed/performed Overall Cognitive Status: Within Functional Limits for tasks assessed                                         General Comments      Exercises     Assessment/Plan    PT Assessment Patient needs continued PT services  PT Problem List Decreased mobility;Decreased activity tolerance;Decreased strength       PT Treatment Interventions DME instruction;Balance training;Gait training;Neuromuscular re-education;Stair training;Functional mobility training;Therapeutic activities;Therapeutic exercise;Patient/family education    PT Goals (Current goals can be found in the Care Plan section)  Acute Rehab PT Goals Patient Stated Goal: to breathe better PT Goal Formulation: With patient Time For Goal Achievement: 09/01/20 Potential to Achieve Goals: Good    Frequency Min 2X/week   Barriers to discharge        Co-evaluation               AM-PAC PT "6 Clicks" Mobility  Outcome Measure Help needed turning from your back to your side while in a flat bed without using bedrails?: A Little Help needed moving from lying on your back to sitting on the side of a flat bed without using bedrails?: A Little Help needed moving to and from a bed to a chair (including a wheelchair)?: A Little Help needed standing up from a chair using your arms (e.g., wheelchair or bedside chair)?: A Little Help needed to walk in hospital room?: A Little Help needed climbing 3-5 steps with a railing? : A Little 6 Click Score: 18    End of Session Equipment Utilized During Treatment: Gait belt Activity Tolerance: Patient limited by fatigue Patient left: in chair;with call bell/phone within reach;with chair alarm set Nurse Communication: Mobility status PT Visit Diagnosis: Other abnormalities of gait and mobility (R26.89);Muscle weakness (generalized) (M62.81);Difficulty in walking, not elsewhere classified (R26.2)    Time: 0454-0981 PT Time Calculation (min) (ACUTE ONLY): 29 min   Charges:     PT Treatments $Therapeutic Exercise: 8-22 mins $Therapeutic  Activity: 8-22 mins        Lieutenant Diego PT, DPT 12:54 PM,08/18/20

## 2020-08-18 NOTE — Progress Notes (Signed)
Updated son, Ramla Hase about patient's status via phone.

## 2020-08-18 NOTE — Progress Notes (Signed)
   08/17/20 2204  Assess: MEWS Score  Temp 98.5 F (36.9 C)  BP (!) 151/75  Pulse Rate (!) 130  Resp (!) 30  Level of Consciousness Alert  SpO2 96 %  O2 Device Nasal Cannula  O2 Flow Rate (L/min) 6 L/min  Assess: MEWS Score  MEWS Temp 0  MEWS Systolic 0  MEWS Pulse 3  MEWS RR 2  MEWS LOC 0  MEWS Score 5  MEWS Score Color Red  Assess: if the MEWS score is Yellow or Red  Were vital signs taken at a resting state? No  Focused Assessment No change from prior assessment  Early Detection of Sepsis Score *See Row Information* High  MEWS guidelines implemented *See Row Information* Yes  Treat  MEWS Interventions Administered prn meds/treatments  Take Vital Signs  Increase Vital Sign Frequency  Red: Q 1hr X 4 then Q 4hr X 4, if remains red, continue Q 4hrs  Escalate  MEWS: Escalate Red: discuss with charge nurse/RN and provider, consider discussing with RRT  Patient is stable, discussed with 2C  Charge and ICU charge has evaluated. Patient has received medication. Heart rate and temperature has improved throughout the shift.

## 2020-08-18 NOTE — Progress Notes (Signed)
Spoke with son over phone to give update. Per Mr. Ringwald, he would like to discuss his Mother's care with the physician. Dr. Maylene Roes alerted and will call son.   Fuller Mandril, RN

## 2020-08-18 NOTE — Evaluation (Signed)
Occupational Therapy Evaluation Patient Details Name: Lindsay Morse MRN: [id number] DOB: 1955/03/29 Today's Date: 08/18/2020    History of Present Illness Pt is a 65 y.o. female with medical history significant for depression, GERD, anxiety, hypertension, COPD, nicotine dependence, chronic diastolic dysfunction CHF, chronic respiratory failure, seizure who presents to the emergency for evaluation of shortness of breath, cough productive of clear phlegm, fever, nausea, vomiting and diarrhea since Saturday 5/28. She took a home COVID test on 5/29 which was negative, then another test on 5/31 which was positive, PCR test here negative.   Clinical Impression   Pt seen for OT evaluation this date. Pt was independent in all ADL and functional mobility, living in a 1 story apartment with a tub/shower. No home O2 use prior to admission. Pt reports currently becoming easily fatigued or out of breath with minimal exertion. Pt currently requires PRN MIN A + increased time/effort for LB ADL tasks and anticipate CGA + RW for ADL mobility due to current functional impairments (See OT Problem List below). Pt educated in energy conservation strategies including pursed lip breathing, activity pacing, home/routines modifications, work simplification, AE/DME, prioritizing of meaningful occupations, and falls prevention. Pt verbalized understanding and would benefit from additional skilled OT services to maximize recall and carryover of learned techniques and facilitate implementation of learned techniques into daily routines. Do not anticipate skilled OT needs upon discharge pending additional progress while hospitalized.      Follow Up Recommendations  No OT follow up    Equipment Recommendations  3 in 1 bedside commode    Recommendations for Other Services       Precautions / Restrictions Precautions Precautions: Fall Precaution Comments: monitor SpO2 with exertion Restrictions Weight Bearing Restrictions: No       Mobility Bed Mobility Overal bed mobility: Needs Assistance Bed Mobility: Supine to Sit;Sit to Supine     Supine to sit: Supervision Sit to supine: Supervision   General bed mobility comments: no physical assist required, increased effort    Transfers Overall transfer level: Needs assistance Equipment used: Rolling walker (2 wheeled) Transfers: Sit to/from Stand Sit to Stand: Min guard         General transfer comment: deferred 2/2 fatigue    Balance Overall balance assessment: Needs assistance Sitting-balance support: Feet supported Sitting balance-Leahy Scale: Good       Standing balance-Leahy Scale: Fair Standing balance comment: more comfortable with UE support                           ADL either performed or assessed with clinical judgement   ADL Overall ADL's : Needs assistance/impaired                                       General ADL Comments: Pt requires PRN MIN A and VC For implementing ECS to support participation in order to minimize over exertion and SOB. CGA for ADL transfers/mobility +RW     Vision Patient Visual Report: No change from baseline       Perception     Praxis      Pertinent Vitals/Pain Pain Assessment: No/denies pain Faces Pain Scale: Hurts a little bit Pain Location: pt reported generalized body aches, and feeling like she was "raw" inside from all the coughing Pain Descriptors / Indicators: Aching Pain Intervention(s): Limited activity within patient's tolerance;Monitored during session;Repositioned  Hand Dominance     Extremity/Trunk Assessment Upper Extremity Assessment Upper Extremity Assessment: Generalized weakness   Lower Extremity Assessment Lower Extremity Assessment: Generalized weakness   Cervical / Trunk Assessment Cervical / Trunk Assessment: Normal   Communication Communication Communication: No difficulties   Cognition Arousal/Alertness: Awake/alert Behavior  During Therapy: WFL for tasks assessed/performed Overall Cognitive Status: Within Functional Limits for tasks assessed                                     General Comments       Exercises Other Exercises Other Exercises: Pt instructed in energy conservation strategies including pursed lip breathing, work simplification, activity pacing, benefits of rest breaks, AE/DME, home/routines modifications, and positioning to support safe participation in ADL/IADL/mobility   Shoulder Instructions      Home Living Family/patient expects to be discharged to:: Private residence Living Arrangements: Alone Available Help at Discharge: Family;Available PRN/intermittently Type of Home: Apartment Home Access: Level entry     Home Layout: One level     Bathroom Shower/Tub: Teacher, early years/pre: Standard     Home Equipment: Hand held shower head          Prior Functioning/Environment Level of Independence: Independent                 OT Problem List: Decreased strength;Decreased activity tolerance;Decreased knowledge of use of DME or AE;Cardiopulmonary status limiting activity      OT Treatment/Interventions: Self-care/ADL training;Therapeutic exercise;Therapeutic activities;Energy conservation;DME and/or AE instruction;Patient/family education    OT Goals(Current goals can be found in the care plan section) Acute Rehab OT Goals Patient Stated Goal: to breathe better OT Goal Formulation: With patient Time For Goal Achievement: 09/01/20 Potential to Achieve Goals: Good ADL Goals Additional ADL Goal #1: Pt will perform daily morning ADL routine with modified independence using learned ECS to support participation and minimize SOB/over exertion. Additional ADL Goal #2: Pt will perform toileting with modified independence, using rest breaks and PLB as needed to support breath recovery. Additional ADL Goal #3: Pt will verbalize plan to implement at least 2  learned ECS To support ADL/IADL participation and safety at home.  OT Frequency: Min 2X/week   Barriers to D/C:            Co-evaluation              AM-PAC OT "6 Clicks" Daily Activity     Outcome Measure Help from another person eating meals?: None Help from another person taking care of personal grooming?: None Help from another person toileting, which includes using toliet, bedpan, or urinal?: A Little Help from another person bathing (including washing, rinsing, drying)?: A Little Help from another person to put on and taking off regular upper body clothing?: None Help from another person to put on and taking off regular lower body clothing?: A Little 6 Click Score: 21   End of Session Nurse Communication: Mobility status  Activity Tolerance: Patient tolerated treatment well;Patient limited by fatigue Patient left: in bed;with call bell/phone within reach;with bed alarm set  OT Visit Diagnosis: Other abnormalities of gait and mobility (R26.89);Muscle weakness (generalized) (M62.81)                Time: 3474-2595 OT Time Calculation (min): 19 min Charges:  OT General Charges $OT Visit: 1 Visit OT Evaluation $OT Eval Low Complexity: 1 Low OT Treatments $Self Care/Home Management :  8-22 mins  Hanley Hays, MPH, MS, OTR/L ascom [phone] 08/18/20, 3:10 PM

## 2020-08-19 ENCOUNTER — Inpatient Hospital Stay: Payer: Medicare Other

## 2020-08-19 LAB — D-DIMER, QUANTITATIVE: D-Dimer, Quant: 2.36 ug/mL-FEU — ABNORMAL HIGH (ref 0.00–0.50)

## 2020-08-19 LAB — CBC WITH DIFFERENTIAL/PLATELET
Abs Immature Granulocytes: 0.04 10*3/uL (ref 0.00–0.07)
Basophils Absolute: 0 10*3/uL (ref 0.0–0.1)
Basophils Relative: 0 %
Eosinophils Absolute: 0 10*3/uL (ref 0.0–0.5)
Eosinophils Relative: 0 %
HCT: 34.6 % — ABNORMAL LOW (ref 36.0–46.0)
Hemoglobin: 12 g/dL (ref 12.0–15.0)
Immature Granulocytes: 1 %
Lymphocytes Relative: 21 %
Lymphs Abs: 1.3 10*3/uL (ref 0.7–4.0)
MCH: 32.7 pg (ref 26.0–34.0)
MCHC: 34.7 g/dL (ref 30.0–36.0)
MCV: 94.3 fL (ref 80.0–100.0)
Monocytes Absolute: 0.3 10*3/uL (ref 0.1–1.0)
Monocytes Relative: 5 %
Neutro Abs: 4.8 10*3/uL (ref 1.7–7.7)
Neutrophils Relative %: 73 %
Platelets: 209 10*3/uL (ref 150–400)
RBC: 3.67 MIL/uL — ABNORMAL LOW (ref 3.87–5.11)
RDW: 12.2 % (ref 11.5–15.5)
Smear Review: NORMAL
WBC: 6.5 10*3/uL (ref 4.0–10.5)
nRBC: 0 % (ref 0.0–0.2)

## 2020-08-19 LAB — COMPREHENSIVE METABOLIC PANEL
ALT: 20 U/L (ref 0–44)
AST: 24 U/L (ref 15–41)
Albumin: 3.2 g/dL — ABNORMAL LOW (ref 3.5–5.0)
Alkaline Phosphatase: 44 U/L (ref 38–126)
Anion gap: 8 (ref 5–15)
BUN: 27 mg/dL — ABNORMAL HIGH (ref 8–23)
CO2: 27 mmol/L (ref 22–32)
Calcium: 8.9 mg/dL (ref 8.9–10.3)
Chloride: 104 mmol/L (ref 98–111)
Creatinine, Ser: 0.69 mg/dL (ref 0.44–1.00)
GFR, Estimated: >60 mL/min (ref >60)
Glucose, Bld: 159 mg/dL — ABNORMAL HIGH (ref 70–99)
Potassium: 4.3 mmol/L (ref 3.5–5.1)
Sodium: 139 mmol/L (ref 135–145)
Total Bilirubin: 0.5 mg/dL (ref 0.3–1.2)
Total Protein: 6.2 g/dL — ABNORMAL LOW (ref 6.5–8.1)

## 2020-08-19 LAB — PHOSPHORUS: Phosphorus: 2.4 mg/dL — ABNORMAL LOW (ref 2.5–4.6)

## 2020-08-19 LAB — MAGNESIUM: Magnesium: 2.2 mg/dL (ref 1.7–2.4)

## 2020-08-19 LAB — C-REACTIVE PROTEIN: CRP: 10.3 mg/dL — ABNORMAL HIGH (ref <1.0)

## 2020-08-19 LAB — FERRITIN: Ferritin: 466 ng/mL — ABNORMAL HIGH (ref 11–307)

## 2020-08-19 MED ORDER — ENOXAPARIN SODIUM 40 MG/0.4ML IJ SOSY
0.5000 mg/kg | PREFILLED_SYRINGE | INTRAMUSCULAR | Status: DC
  Administered 2020-08-19 – 2020-08-20 (×2): 42.5 mg via SUBCUTANEOUS
  Filled 2020-08-19 (×2): qty 0.8

## 2020-08-19 MED ORDER — POTASSIUM CHLORIDE CRYS ER 20 MEQ PO TBCR
20.0000 meq | EXTENDED_RELEASE_TABLET | Freq: Once | ORAL | Status: AC
  Administered 2020-08-19: 20 meq via ORAL
  Filled 2020-08-19: qty 1

## 2020-08-19 MED ORDER — FUROSEMIDE 10 MG/ML IJ SOLN
20.0000 mg | Freq: Once | INTRAMUSCULAR | Status: AC
  Administered 2020-08-19: 20 mg via INTRAVENOUS
  Filled 2020-08-19: qty 4

## 2020-08-19 MED ORDER — VANCOMYCIN HCL 750 MG/150ML IV SOLN
750.0000 mg | Freq: Two times a day (BID) | INTRAVENOUS | Status: DC
  Administered 2020-08-19: 750 mg via INTRAVENOUS
  Filled 2020-08-19 (×3): qty 150

## 2020-08-19 MED ORDER — VANCOMYCIN HCL 2000 MG/400ML IV SOLN
2000.0000 mg | Freq: Once | INTRAVENOUS | Status: AC
  Administered 2020-08-19: 2000 mg via INTRAVENOUS
  Filled 2020-08-19: qty 400

## 2020-08-19 MED ORDER — GUAIFENESIN ER 600 MG PO TB12: 1200.0000 mg | ORAL_TABLET | Freq: Two times a day (BID) | ORAL | Status: DC

## 2020-08-19 MED ORDER — GUAIFENESIN-DM 100-10 MG/5ML PO SYRP
5.0000 mL | ORAL_SOLUTION | ORAL | Status: DC | PRN
  Administered 2020-08-19 – 2020-08-21 (×4): 5 mL via ORAL
  Filled 2020-08-19 (×6): qty 5

## 2020-08-19 MED ORDER — METHYLPREDNISOLONE SODIUM SUCC 125 MG IJ SOLR
1.0000 mg/kg | Freq: Two times a day (BID) | INTRAMUSCULAR | Status: DC
  Administered 2020-08-19 – 2020-08-21 (×4): 82.5 mg via INTRAVENOUS
  Filled 2020-08-19 (×5): qty 2

## 2020-08-19 MED ORDER — VANCOMYCIN HCL 2000 MG/400ML IV SOLN
2000.0000 mg | Freq: Once | INTRAVENOUS | Status: DC
  Filled 2020-08-19 (×2): qty 400

## 2020-08-19 MED ORDER — K PHOS MONO-SOD PHOS DI & MONO 155-852-130 MG PO TABS
250.0000 mg | ORAL_TABLET | Freq: Two times a day (BID) | ORAL | Status: AC
  Administered 2020-08-19 – 2020-08-20 (×3): 250 mg via ORAL
  Filled 2020-08-19 (×3): qty 1

## 2020-08-19 MED ORDER — GUAIFENESIN ER 600 MG PO TB12
600.0000 mg | ORAL_TABLET | Freq: Two times a day (BID) | ORAL | Status: DC | PRN
  Administered 2020-08-19: 600 mg via ORAL
  Filled 2020-08-19: qty 1

## 2020-08-19 MED ORDER — GUAIFENESIN ER 600 MG PO TB12
1200.0000 mg | ORAL_TABLET | Freq: Two times a day (BID) | ORAL | Status: DC
  Administered 2020-08-19 – 2020-08-21 (×5): 1200 mg via ORAL
  Filled 2020-08-19 (×5): qty 2

## 2020-08-19 NOTE — Progress Notes (Signed)
Asked to assess patient due to low o2 saturation.  Found patient on 6 liters with o2 sat of 99%.  Discussed with patient and RN episodes of desaturation with exertion, movement, talking are symptoms related to COVID. Saturation improves once patient is still.  Patient will also experience sob and o2 is used to help supplement.  Reeducated patient on use of IS and flutter. High flow bubble left at bedside. Will continue current therapy for now

## 2020-08-19 NOTE — Progress Notes (Signed)
Spoke to son Legrand Como who called to get an update on patient. He had several medical questions that I will defer to day nurse to pass along to attending. Son will be visiting tomorrow afternoon. During first half of shift patient had expiratory wheezes, coughing, and R: 24-26, 6L O2 and has remained aboved 92% sats. Rested well other half of shift. Dr. Kennon Holter discontinued Robitussin DM and ordered Mucinex 12 hr tablet 600 mg BID which patient has not needed.

## 2020-08-19 NOTE — Progress Notes (Signed)
PROGRESS NOTE    Lindsay Morse  ZOX:[id number] DOB: 1955-12-23 DOA: 08/17/2020 PCP: Gladstone Lighter, MD   Brief Narrative: 65 year old with past medical history significant for depression, GERD, anxiety, hypertension, COPD, nicotine dependence, chronic diastolic heart failure, chronic respiratory failure, seizure who presents to the emergency department complaining of shortness of breath, productive cough of clear phlegm, fever, nausea, vomiting and diarrhea since 5/28.  Patient took a home COVID test on 5/29 which was negative then another test on 531 which was positive.  In the emergency department patient COVID PCR was negative. Procedure thickening with patchy airspace opacity.  Hilar and bibasilar regions suspicious for multifocal atypical viral pneumonia.  CTA chest was negative for PE but was consistent with multifocal infection, possible aspiration as well.    Assessment & Plan:   Principal Problem:   Pneumonia due to COVID-19 virus Active Problems:   TOBACCO ABUSE   COPD with acute exacerbation (HCC)   GERD (gastroesophageal reflux disease)   Hypertension   Anxiety   Seizure disorder (HCC)   Chronic diastolic CHF (congestive heart failure) (HCC)   Hyponatremia  1-Acute Hypoxic respiratory failure satting 92 COVID-19 pneumonia: Patient tested positive by home test on 5/31st, negative PCR on day of admission. Continue  with IV steroids. Continue with IV Rocephin and azithromycin He has received  2 doses of Remdesivir. She would like to hold on getting Remdesivir.  She continues to be short of breath and complaining of cough.  Her oxygen requirement has remained stable.  Her inflammatory markers a started to decrease today. Prone position during day.  Continue with vitamin c, zinc.   COVID-19 Labs  Recent Labs    08/17/20 1511 08/18/20 0544 08/19/20 0406  DDIMER 2.94* 2.61* 2.36*  FERRITIN  --  422* 466*  CRP  --  15.7* 10.3*    Lab Results  Component Value Date    [place] NEGATIVE 08/17/2020    2-COPD acute exacerbation: Continue with IV steroids. Continue with inhaler: Dulera, Singulair, albuterol, Incruse. Added Robitussin as needed   3-HTN; Continue with Coreg and Cozaar.  4-Seizure disorder: Continue with Keppra 5-Tobacco abuse: She was started on Wellbutrin as an outpatient 6-elevation of D-dimer: Related to COVID.  CTA on admission negative for blood clot.  We will proceed with Dopplers of lower extremity. 7-Facial Swelling, BL worse on right. Mild redness.  -Started IV vancomycin. Continue with ceftriaxone.  -IV lasix.  8-Hypophosphatemia;  Replete orally.   Estimated body mass index is 34.39 kg/m as calculated from the following:   Height as of this encounter: 5\' 1"  (1.549 m).   Weight as of this encounter: 82.6 kg.   DVT prophylaxis: Lovenox Code Status: DNR Family Communication: Care discussed with son over the phone Disposition Plan:  Status is: Inpatient  Remains inpatient appropriate because:IV treatments appropriate due to intensity of illness or inability to take PO   Dispo: The patient is from: Home              Anticipated d/c is to: Home              Patient currently is not medically stable to d/c.   Difficult to place patient No        Consultants:   None  Procedures:   Doppler  Antimicrobials:    Subjective: She report SOB, not better, report cough.  She report family history of blood clot.  She would like to hold on getting Remdesivir.    Objective: Vitals:   08/18/20  2003 08/18/20 2322 08/19/20 0344 08/19/20 0905  BP: (!) 110/53 99/83 119/64 130/76  Pulse: 96 99 88 82  Resp: (!) 26 (!) 24 20 (!) 24  Temp: 98.4 F (36.9 C) 97.9 F (36.6 C) 97.9 F (36.6 C) 97.7 F (36.5 C)  TempSrc:      SpO2: 93% 92% 99% 94%  Weight:      Height:        Intake/Output Summary (Last 24 hours) at 08/19/2020 1001 Last data filed at 08/19/2020 0655 Gross per 24 hour  Intake 461.65 ml   Output --  Net 461.65 ml   Filed Weights   08/17/20 1201  Weight: 82.6 kg    Examination:  General exam: Appears calm and comfortable  Respiratory system: BL expiratory wheezing.  Cardiovascular system: S1 & S2 heard, RRR. No JVD, murmurs, rubs, gallops or clicks. No pedal edema. Gastrointestinal system: Abdomen is nondistended, soft and nontender. No organomegaly or masses felt. Normal bowel sounds heard. Central nervous system: Alert and oriented. No focal neurological deficits. Extremities: Symmetric 5 x 5 power. Skin: facial swelling. redness    Data Reviewed: I have personally reviewed following labs and imaging studies  CBC: Recent Labs  Lab 08/17/20 1211 08/18/20 0544 08/19/20 0406  WBC 7.7 6.2 6.5  NEUTROABS 6.1 5.3 4.8  HGB 13.4 12.7 12.0  HCT 38.5 35.7* 34.6*  MCV 93.4 93.5 94.3  PLT 192 194 852   Basic Metabolic Panel: Recent Labs  Lab 08/17/20 1211 08/17/20 1511 08/18/20 0544 08/19/20 0406  NA 131*  --  136 139  K 3.8  --  4.2 4.3  CL 97*  --  102 104  CO2 22  --  25 27  GLUCOSE 131*  --  147* 159*  BUN 22  --  23 27*  CREATININE 0.97  --  0.79 0.69  CALCIUM 8.7*  --  8.3* 8.9  MG  --  2.1 2.2 2.2  PHOS  --   --  3.7 2.4*   GFR: Estimated Creatinine Clearance: 70.1 mL/min (by C-G formula based on SCr of 0.69 mg/dL). Liver Function Tests: Recent Labs  Lab 08/17/20 1211 08/18/20 0544 08/19/20 0406  AST 32 29 24  ALT 21 20 20   ALKPHOS 51 47 44  BILITOT 0.7 0.4 0.5  PROT 7.4 6.7 6.2*  ALBUMIN 4.0 3.5 3.2*   No results for input(s): LIPASE, AMYLASE in the last 168 hours. No results for input(s): AMMONIA in the last 168 hours. Coagulation Profile: Recent Labs  Lab 08/17/20 1511  INR 1.0   Cardiac Enzymes: No results for input(s): CKTOTAL, CKMB, CKMBINDEX, TROPONINI in the last 168 hours. BNP (last 3 results) No results for input(s): PROBNP in the last 8760 hours. HbA1C: No results for input(s): HGBA1C in the last 72  hours. CBG: No results for input(s): GLUCAP in the last 168 hours. Lipid Profile: No results for input(s): CHOL, HDL, LDLCALC, TRIG, CHOLHDL, LDLDIRECT in the last 72 hours. Thyroid Function Tests: No results for input(s): TSH, T4TOTAL, FREET4, T3FREE, THYROIDAB in the last 72 hours. Anemia Panel: Recent Labs    08/18/20 0544 08/19/20 0406  FERRITIN 422* 466*   Sepsis Labs: Recent Labs  Lab 08/17/20 1211 08/17/20 1511 08/17/20 1809  PROCALCITON 0.24  --   --   LATICACIDVEN  --  1.1 1.2    Recent Results (from the past 240 hour(s))  Blood culture (routine single)     Status: None (Preliminary result)   Collection Time: 08/17/20  3:11  PM   Specimen: BLOOD  Result Value Ref Range Status   Specimen Description BLOOD RIGHT ANTECUBITAL  Final   Special Requests   Final    BOTTLES DRAWN AEROBIC AND ANAEROBIC Blood Culture results may not be optimal due to an inadequate volume of blood received in culture bottles   Culture   Final    NO GROWTH 2 DAYS Performed at [facility], [address] [place], [place] [address]    Report Status PENDING  Incomplete  Resp Panel by RT-PCR (Flu A&B, Covid) Nasopharyngeal Swab     Status: None   Collection Time: 08/17/20  3:11 PM   Specimen: Nasopharyngeal Swab; Nasopharyngeal(NP) swabs in vial transport medium  Result Value Ref Range Status   SARS Coronavirus 2 by RT PCR NEGATIVE NEGATIVE Final    Comment: (NOTE) SARS-CoV-2 target nucleic acids are NOT DETECTED.  The SARS-CoV-2 RNA is generally detectable in upper respiratory specimens during the acute phase of infection. The lowest concentration of SARS-CoV-2 viral copies this assay can detect is 138 copies/mL. A negative result does not preclude SARS-Cov-2 infection and should not be used as the sole basis for treatment or other patient management decisions. A negative result may occur with  improper specimen collection/handling, submission of specimen other than  nasopharyngeal swab, presence of viral mutation(s) within the areas targeted by this assay, and inadequate number of viral copies(<138 copies/mL). A negative result must be combined with clinical observations, patient history, and epidemiological information. The expected result is Negative.  Fact Sheet for Patients:  EntrepreneurPulse.com.au  Fact Sheet for Healthcare Providers:  IncredibleEmployment.be  This test is no t yet approved or cleared by the [place] FDA and  has been authorized for detection and/or diagnosis of SARS-CoV-2 by FDA under an Emergency Use Authorization (EUA). This EUA will remain  in effect (meaning this test can be used) for the duration of the COVID-19 declaration under Section 564(b)(1) of the Act, 21 U.S.C.section 360bbb-3(b)(1), unless the authorization is terminated  or revoked sooner.       Influenza A by PCR NEGATIVE NEGATIVE Final   Influenza B by PCR NEGATIVE NEGATIVE Final    Comment: (NOTE) The Xpert Xpress SARS-CoV-2/FLU/RSV plus assay is intended as an aid in the diagnosis of influenza from Nasopharyngeal swab specimens and should not be used as a sole basis for treatment. Nasal washings and aspirates are unacceptable for Xpert Xpress SARS-CoV-2/FLU/RSV testing.  Fact Sheet for Patients: EntrepreneurPulse.com.au  Fact Sheet for Healthcare Providers: IncredibleEmployment.be  This test is not yet approved or cleared by the [place] FDA and has been authorized for detection and/or diagnosis of SARS-CoV-2 by FDA under an Emergency Use Authorization (EUA). This EUA will remain in effect (meaning this test can be used) for the duration of the COVID-19 declaration under Section 564(b)(1) of the Act, 21 U.S.C. section 360bbb-3(b)(1), unless the authorization is terminated or revoked.  Performed at [facility], [address], [place], [place]  [address]          Radiology Studies: CT Angio Chest PE W and/or Wo Contrast  Result Date: 08/17/2020 CLINICAL DATA:  Suspected pulmonary embolism in a 65 year old female with infiltrate on chest x-ray and shortness of breath. EXAM: CT ANGIOGRAPHY CHEST WITH CONTRAST TECHNIQUE: Multidetector CT imaging of the chest was performed using the standard protocol during bolus administration of intravenous contrast. Multiplanar CT image reconstructions and MIPs were obtained to evaluate the vascular anatomy. CONTRAST:  47mL OMNIPAQUE IOHEXOL 350 MG/ML SOLN COMPARISON:  None. FINDINGS: Cardiovascular: Calcified atheromatous plaque of the thoracic aorta. Normal caliber of the thoracic aorta. Normal heart size. Normal caliber of central pulmonary vessels. Density of the main pulmonary artery at 334 Hounsfield units. No evidence of pulmonary embolism, study adequate for evaluation with mild limitation with respect to subsegmental vessels in the lung bases. Mediastinum/Nodes: RIGHT hilar lymph node at 1.6 cm (image 44/4) Mildly enlarged lymph nodes along the RIGHT paratracheal chain largest approximately 11 mm. Mild fullness of subcarinal nodal tissue and LEFT hilar nodal tissue. No thoracic inlet lymphadenopathy. No axillary lymphadenopathy. Esophagus grossly normal. Lungs/Pleura: Diffuse peribronchovascular nodularity and ground-glass. More focal area of consolidation in the anterior RIGHT upper lobe on image 56 of series 6. Scattered nodules throughout the chest none of which were present just over 6 weeks ago. Background pulmonary emphysema. Early consolidative changes at the LEFT lung base as well with predominant feature in this area bronchovascular nodularity and tree-in-bud opacities. No sign of effusion. Upper Abdomen: Incidental imaging of upper abdominal contents without acute process. Musculoskeletal: No acute musculoskeletal findings. Spinal degenerative changes. Review of the MIP images confirms the above  findings. IMPRESSION: 1. No evidence of pulmonary embolism. 2. Signs of multifocal infection, developed in the interval since imaging from April. Would consider atypical infectious etiologies as well as aspiration related changes given the appearance of LEFT lower lobe findings. 3. Given more focal consolidative changes and adenopathy in the RIGHT chest would suggest attention on follow-up to ensure resolution. 4. Emphysema and aortic atherosclerosis. Aortic Atherosclerosis (ICD10-I70.0) and Emphysema (ICD10-J43.9). Electronically Signed   By: Zetta Bills M.D.   On: 08/17/2020 17:55   DG Chest Port 1 View  Result Date: 08/17/2020 CLINICAL DATA:  COVID-19 positive, shortness of breath, cough EXAM: PORTABLE CHEST 1 VIEW COMPARISON:  06/27/2020 FINDINGS: Cardiomediastinal contours are within normal limits. Atherosclerotic calcification of the aortic knob. Interstitial thickening with patchy airspace opacities within the perihilar and bibasilar regions. No pleural effusion or pneumothorax. IMPRESSION: Interstitial thickening with patchy airspace opacities within the perihilar and bibasilar regions suspicious for multifocal atypical/viral pneumonia. Electronically Signed   By: Davina Poke D.O.   On: 08/17/2020 14:55        Scheduled Meds: . albuterol  2 puff Inhalation Q4H  . aspirin EC  81 mg Oral Daily  . calcium-vitamin D  1 tablet Oral Q breakfast  . carvedilol  12.5 mg Oral BID WC  . enoxaparin (LOVENOX) injection  40 mg Subcutaneous Q24H  . ezetimibe  10 mg Oral Daily  . furosemide  20 mg Intravenous Once  . guaiFENesin  1,200 mg Oral BID  . levETIRAcetam  1,000 mg Oral BID  . losartan  50 mg Oral Daily  . methylPREDNISolone (SOLU-MEDROL) injection  1 mg/kg Intravenous Q12H   Followed by  . [START ON 08/20/2020] predniSONE  50 mg Oral Daily  . mometasone-formoterol  2 puff Inhalation BID  . montelukast  10 mg Oral QHS  . pantoprazole  40 mg Oral Daily  . phosphorus  250 mg Oral BID   . rOPINIRole  0.5 mg Oral TID  . tiZANidine  4 mg Oral QHS  . umeclidinium bromide  1 puff Inhalation Daily  . vitamin B-12  1,000 mcg Oral Daily  . zinc sulfate  220 mg Oral Daily   Continuous Infusions: . sodium chloride Stopped (08/18/20 2314)  . azithromycin Stopped (08/18/20 2126)  . cefTRIAXone (ROCEPHIN)  IV Stopped (08/18/20 2110)  . remdesivir 100 mg in NS 100 mL Stopped (08/18/20  1024)     LOS: 2 days    Time spent: 35 minutes.     Elmarie Shiley, MD [facility]   If 7PM-7AM, please contact night-coverage [email or web address]  08/19/2020, 10:01 AM

## 2020-08-19 NOTE — Consult Note (Signed)
Pharmacy Antibiotic Note  Lindsay Morse is a 65 y.o. female admitted on 08/17/2020 with cellulitis. Pt presented with shortness of breath, reportedly testing positive for COVID at home on 5/31. CXR concerning for multifocal atypical/viral PNA. PMH includes depression, GERD, anxiety, HTN, COPD, nicotine dependence, CHF, and seizure. Pharmacy has been consulted for vancomycin dosing.  Scr 0.69, PCT 0.24, afebrile, WBC 6.5  Plan: Give vancomycin 2 g IV x 1 loading dose, followed by 750 mg q12h  - Estimated AUC 509.7, Cmin 16.3  - Used Scr 0.8 and Vd 0.72 to calculate Monitor renal function daily Check levels with 4th dose   Height: 5\' 1"  (154.9 cm) Weight: 82.6 kg (182 lb) IBW/kg (Calculated) : 47.8  Temp (24hrs), Avg:98 F (36.7 C), Min:97.7 F (36.5 C), Max:98.4 F (36.9 C)  Recent Labs  Lab 08/17/20 1211 08/17/20 1511 08/17/20 1809 08/18/20 0544 08/19/20 0406  WBC 7.7  --   --  6.2 6.5  CREATININE 0.97  --   --  0.79 0.69  LATICACIDVEN  --  1.1 1.2  --   --     Estimated Creatinine Clearance: 70.1 mL/min (by C-G formula based on SCr of 0.69 mg/dL).    Allergies  Allergen Reactions  . Regadenoson Other (See Comments)    Trouble breathing  . Statins     Other reaction(s): Muscle Pain Joint pains  . Milnacipran     REACTION: tachycardia    Antimicrobials this admission: 6/4 vancomycin >> 6/2 azithromycin >> 6/6 6/2 ceftriaxone >> 6/6 6/2 remdesivir >> 6/6  Microbiology results: 6/2 BCx: NGTD 6/2 UCx: pending  Thank you for allowing pharmacy to be a part of this patient's care.  Benn Moulder, PharmD Pharmacy Resident  08/19/2020 10:21 AM

## 2020-08-19 NOTE — TOC Initial Note (Signed)
Transition of Care [facility]) - Initial/Assessment Note    Patient Details  Name: Lindsay Morse MRN: [id number] Date of Birth: Dec 01, 1955  Transition of Care [facility]) CM/SW Contact:    Magnus Ivan, LCSW Phone Number: 08/19/2020, 10:14 AM  Clinical Narrative:                CSW spoke with patient regarding DC planning. Patient lives alone and typically drives herself to appointments. PCP is Dr. Tressia Miners. [facility]acy is [facility]. No DME, HH, or SNF history. Patient is agreeable to [facility] and DME recommendations, no agency preference. Referral made to [facility] for RW and 3 in 1, [facility] for PT, OT. No additional TOC needs at this time.   Expected Discharge Plan: [place] Barriers to Discharge: Continued Medical Work up   Patient Goals and CMS Choice Patient states their goals for this hospitalization and ongoing recovery are:: home with home health CMS Medicare.gov Compare Post Acute Care list provided to:: Patient Choice offered to / list presented to : Patient  Expected Discharge Plan and Services Expected Discharge Plan: [place]       Living arrangements for the past 2 months: Single Family Home                 DME Arranged: [place] Rile rolling,3-N-1 DME Agency:  [facility] Aver) Date DME Agency Contacted: 08/19/20   Representative spoke with at DME Agency: Melene Muller [place]: PT,OT [place] Agency: [place] ([place]) Date [place]: 08/19/20   Representative spoke with at [place]: Floydene Flock  Prior Living Arrangements/Services Living arrangements for the past 2 months: Single Family Home Lives with:: Self Patient language and need for interpreter reviewed:: Yes Do you feel safe going back to the place where you live?: Yes      Need for Family Participation in Patient Care: Yes (Comment) Care giver support system in place?: Yes (comment)   Criminal Activity/Legal Involvement Pertinent to Current  Situation/Hospitalization: No - Comment as needed  Activities of Daily Living Home Assistive Devices/Equipment: Blood pressure cuff,Nebulizer ADL Screening (condition at time of admission) Patient's cognitive ability adequate to safely complete daily activities?: Yes Is the patient deaf or have difficulty hearing?: No Does the patient have difficulty seeing, even when wearing glasses/contacts?: No Does the patient have difficulty concentrating, remembering, or making decisions?: No Patient able to express need for assistance with ADLs?: Yes Does the patient have difficulty dressing or bathing?: No Independently performs ADLs?: Yes (appropriate for developmental age) Does the patient have difficulty walking or climbing stairs?: No Weakness of Legs: Both Weakness of Arms/Hands: None  Permission Sought/Granted Permission sought to share information with : Chartered certified accountant granted to share information with : Yes, Verbal Permission Granted     Permission granted to share info w AGENCY: [place], DME agencies        Emotional Assessment       Orientation: : Oriented to Self,Oriented to Place,Oriented to  Time,Oriented to Situation Alcohol / Substance Use: Not Applicable Psych Involvement: No (comment)  Admission diagnosis:  Dehydration [E86.0] SOB (shortness of breath) [R06.02] Acute respiratory failure with hypoxia (HCC) [J96.01] Pneumonia of both lungs due to infectious organism, unspecified part of lung [J18.9] COVID [U07.1] Pneumonia due to COVID-19 virus [U07.1, J12.82] Patient Active Problem List   Diagnosis Date Noted  . Pneumonia due to COVID-19 virus 08/17/2020  . Chronic diastolic CHF (congestive heart failure) ([place]) 08/17/2020  . Hyponatremia 08/17/2020  .  PAD (peripheral artery disease) ([place]) 01/01/2020  . Aortic atherosclerosis ([place]) 01/01/2020  . Anxiety 09/14/2019  . Lung nodule 09/14/2019  . Seborrheic keratoses 09/14/2019  . Acute pain of  right knee 09/14/2019  . Hypertension 10/07/2018  . Seizure disorder ([place]) 02/18/2014  . Syncope 10/14/2013  . SOB (shortness of breath) 10/14/2013  . Aortic calcification, 04/07/2012 CT Chest 05/28/2013  . GERD (gastroesophageal reflux disease) 05/28/2013  . Carotid artery calcification 05/28/2013  . Obesity (BMI 30-39.9) 05/28/2013  . Herniation of cervical intervertebral disc with radiculopathy 05/28/2013  . Hemoptysis 03/25/2012  . COPD with acute exacerbation ([place]) 09/18/2011  . COPD with emphysema ([place]) 06/03/2011  . COLONIC POLYPS, HX OF 08/30/2009  . IBS 06/26/2009  . TOBACCO ABUSE 04/07/2009  . SLEEP DISORDER 09/26/2008  . Hyperlipidemia 08/19/2008  . Major depressive disorder, recurrent episode, in partial remission ([place]) 08/09/2008  . Fibromyalgia 11/18/2006  . Restless leg syndrome 11/18/2006   PCP:  Gladstone Lighter, MD Pharmacy:   [place], [place] [address], [place] [address] Phone: [phone] Fax: [place], [place] [address] [place] [address] Phone: [phone] Fax: [phone]  [facility] [place], [place] - [address] [place] [address] Phone: [phone] Fax: [phone]     Social Determinants of Health (SDOH) Interventions    Readmission Risk Interventions No flowsheet data found.

## 2020-08-20 LAB — CBC WITH DIFFERENTIAL/PLATELET
Abs Immature Granulocytes: 0.1 10*3/uL — ABNORMAL HIGH (ref 0.00–0.07)
Basophils Absolute: 0 10*3/uL (ref 0.0–0.1)
Basophils Relative: 0 %
Eosinophils Absolute: 0 10*3/uL (ref 0.0–0.5)
Eosinophils Relative: 0 %
HCT: 34.1 % — ABNORMAL LOW (ref 36.0–46.0)
Hemoglobin: 11.6 g/dL — ABNORMAL LOW (ref 12.0–15.0)
Immature Granulocytes: 1 %
Lymphocytes Relative: 13 %
Lymphs Abs: 1.6 10*3/uL (ref 0.7–4.0)
MCH: 32.4 pg (ref 26.0–34.0)
MCHC: 34 g/dL (ref 30.0–36.0)
MCV: 95.3 fL (ref 80.0–100.0)
Monocytes Absolute: 0.7 10*3/uL (ref 0.1–1.0)
Monocytes Relative: 6 %
Neutro Abs: 9.5 10*3/uL — ABNORMAL HIGH (ref 1.7–7.7)
Neutrophils Relative %: 80 %
Platelets: 255 10*3/uL (ref 150–400)
RBC: 3.58 MIL/uL — ABNORMAL LOW (ref 3.87–5.11)
RDW: 12.2 % (ref 11.5–15.5)
Smear Review: NORMAL
WBC: 11.8 10*3/uL — ABNORMAL HIGH (ref 4.0–10.5)
nRBC: 0 % (ref 0.0–0.2)

## 2020-08-20 LAB — COMPREHENSIVE METABOLIC PANEL
ALT: 38 U/L (ref 0–44)
AST: 41 U/L (ref 15–41)
Albumin: 3.2 g/dL — ABNORMAL LOW (ref 3.5–5.0)
Alkaline Phosphatase: 45 U/L (ref 38–126)
Anion gap: 10 (ref 5–15)
BUN: 33 mg/dL — ABNORMAL HIGH (ref 8–23)
CO2: 30 mmol/L (ref 22–32)
Calcium: 8.6 mg/dL — ABNORMAL LOW (ref 8.9–10.3)
Chloride: 101 mmol/L (ref 98–111)
Creatinine, Ser: 0.85 mg/dL (ref 0.44–1.00)
GFR, Estimated: >60 mL/min (ref >60)
Glucose, Bld: 171 mg/dL — ABNORMAL HIGH (ref 70–99)
Potassium: 4 mmol/L (ref 3.5–5.1)
Sodium: 141 mmol/L (ref 135–145)
Total Bilirubin: 0.5 mg/dL (ref 0.3–1.2)
Total Protein: 6.1 g/dL — ABNORMAL LOW (ref 6.5–8.1)

## 2020-08-20 LAB — C-REACTIVE PROTEIN: CRP: 4.1 mg/dL — ABNORMAL HIGH (ref <1.0)

## 2020-08-20 LAB — MAGNESIUM: Magnesium: 2.3 mg/dL (ref 1.7–2.4)

## 2020-08-20 LAB — D-DIMER, QUANTITATIVE: D-Dimer, Quant: 1.97 ug/mL-FEU — ABNORMAL HIGH (ref 0.00–0.50)

## 2020-08-20 LAB — FERRITIN: Ferritin: 550 ng/mL — ABNORMAL HIGH (ref 11–307)

## 2020-08-20 LAB — PHOSPHORUS: Phosphorus: 3.6 mg/dL (ref 2.5–4.6)

## 2020-08-20 MED ORDER — FUROSEMIDE 10 MG/ML IJ SOLN
40.0000 mg | Freq: Every day | INTRAMUSCULAR | Status: DC
  Administered 2020-08-20 – 2020-08-21 (×2): 40 mg via INTRAVENOUS
  Filled 2020-08-20 (×2): qty 4

## 2020-08-20 MED ORDER — VANCOMYCIN HCL 1000 MG/200ML IV SOLN
1000.0000 mg | INTRAVENOUS | Status: DC
  Administered 2020-08-20: 1000 mg via INTRAVENOUS
  Filled 2020-08-20 (×2): qty 200

## 2020-08-20 NOTE — Progress Notes (Signed)
  Patient continues to be on 6L [place]. Desated to 84-89 at rest and with exertion, went back up to 94-97 after a laying down for a few minutes. RT evaluated and stated that this was normal, left HF equipment in case patient's O2 sats did not go back up. Robitussin DM PRN given twice during shift. Continues to have productive cough, tan thick sputum. Encouraged spirometry, currently up to 600 with goal of 1000. No facial edema during beginning of shift but did come back after midnight.

## 2020-08-20 NOTE — Progress Notes (Addendum)
PROGRESS NOTE    Lindsay Morse  EGB:[id number] DOB: [birth year] DOA: 08/17/2020 PCP: Gladstone Lighter, MD   Brief Narrative: 65 year old with past medical history significant for depression, GERD, anxiety, hypertension, COPD, nicotine dependence, chronic diastolic heart failure, chronic respiratory failure, seizure who presents to the emergency department complaining of shortness of breath, productive cough of clear phlegm, fever, nausea, vomiting and diarrhea since 5/28.  Patient took a home COVID test on 5/29 which was negative then another test on 531 which was positive.  In the emergency department patient COVID PCR was negative. Procedure thickening with patchy airspace opacity.  Hilar and bibasilar regions suspicious for multifocal atypical viral pneumonia.  CTA chest was negative for PE but was consistent with multifocal infection, possible aspiration as well.    Assessment & Plan:   Principal Problem:   Pneumonia due to COVID-19 virus Active Problems:   TOBACCO ABUSE   COPD with acute exacerbation (HCC)   GERD (gastroesophageal reflux disease)   Hypertension   Anxiety   Seizure disorder (HCC)   Chronic diastolic CHF (congestive heart failure) (HCC)   Hyponatremia  1-Acute Hypoxic respiratory failure, possible COVID-19 pneumonia: Patient tested positive by home test on 5/31st, negative PCR on day of admission. Continue  with IV steroids. Continue with IV Rocephin and azithromycin She has received  2 doses of Remdesivir. Patient Does not want to complete remdesivir tx.  Inflammatory Marker trending down.  Prone position.  Continue with vitamin c, zinc.  She is breathing better today, cough has improved.  IV lasix.   COVID-19 Labs  Recent Labs    08/18/20 0544 08/19/20 0406 08/20/20 0419  DDIMER 2.61* 2.36* 1.97*  FERRITIN 422* 466* 550*  CRP 15.7* 10.3* 4.1*    Lab Results  Component Value Date   SARSCOV2NAA NEGATIVE 08/17/2020    2-COPD acute  exacerbation: Continue with IV steroids. Continue with inhaler: Dulera, Singulair, albuterol, Incruse. Continue Robitussin as needed Her lungs sound better today.   3-HTN; Continue with Coreg and Cozaar.  4-Seizure disorder: Continue with Keppra.  5-Tobacco abuse: She was started on Wellbutrin as an outpatient.  6-elevation of D-dimer: Related to COVID.  CTA on admission negative for blood clot.  Dopplers of lower extremity negative for DVT.  7-Facial Swelling, BL worse on right. Mild redness.  -Continue with IV vancomycin. Continue with ceftriaxone.  -repeat IV lasix.  Redness improved.   8-Hypophosphatemia;  Resolved.   Estimated body mass index is 34.39 kg/m as calculated from the following:   Height as of this encounter: 5\' 1"  (1.549 m).   Weight as of this encounter: 82.6 kg.   DVT prophylaxis: Lovenox Code Status: DNR Family Communication: Son updated over phone 6-05 Disposition Plan:  Status is: Inpatient  Remains inpatient appropriate because:IV treatments appropriate due to intensity of illness or inability to take PO   Dispo: The patient is from: Home              Anticipated d/c is to: Home              Patient currently is not medically stable to d/c.   Difficult to place patient No        Consultants:   None  Procedures:   Doppler  Antimicrobials:    Subjective: She is breathing a little better. She report improvement of cough.  No BM since Thursday. She had to take imodium for diarrhea.  She relates that when she was diagnose with seizure she was told she is at  increase risk for blood clotting. Patient has been on Lovenox for DVT prophylaxis. She has had work up for blood clot which has been negative.   Objective: Vitals:   08/19/20 2352 08/20/20 0332 08/20/20 0902 08/20/20 1124  BP: 106/87 120/73 (!) 154/81 122/65  Pulse: 75 96 92 82  Resp: (!) 24 20 19 17   Temp: 97.8 F (36.6 C) 97.9 F (36.6 C) 98.1 F (36.7 C) 98.1 F (36.7  C)  TempSrc:      SpO2: 97% 93% 92% 91%  Weight:      Height:        Intake/Output Summary (Last 24 hours) at 08/20/2020 1259 Last data filed at 08/20/2020 0406 Gross per 24 hour  Intake 518.61 ml  Output --  Net 518.61 ml   Filed Weights   08/17/20 1201  Weight: 82.6 kg    Examination:  General exam: NAD Respiratory system: CTA, no wheezing.  Cardiovascular system: S 1, S 2 RRR. Gastrointestinal system: BS present, soft, nt Central nervous system: alert Extremities: Symmetric power Skin: facial edema decreasing, less redness    Data Reviewed: I have personally reviewed following labs and imaging studies  CBC: Recent Labs  Lab 08/17/20 1211 08/18/20 0544 08/19/20 0406 08/20/20 0419  WBC 7.7 6.2 6.5 11.8*  NEUTROABS 6.1 5.3 4.8 9.5*  HGB 13.4 12.7 12.0 11.6*  HCT 38.5 35.7* 34.6* 34.1*  MCV 93.4 93.5 94.3 95.3  PLT 192 194 209 169   Basic Metabolic Panel: Recent Labs  Lab 08/17/20 1211 08/17/20 1511 08/18/20 0544 08/19/20 0406 08/20/20 0419  NA 131*  --  136 139 141  K 3.8  --  4.2 4.3 4.0  CL 97*  --  102 104 101  CO2 22  --  25 27 30   GLUCOSE 131*  --  147* 159* 171*  BUN 22  --  23 27* 33*  CREATININE 0.97  --  0.79 0.69 0.85  CALCIUM 8.7*  --  8.3* 8.9 8.6*  MG  --  2.1 2.2 2.2 2.3  PHOS  --   --  3.7 2.4* 3.6   GFR: Estimated Creatinine Clearance: 66 mL/min (by C-G formula based on SCr of 0.85 mg/dL). Liver Function Tests: Recent Labs  Lab 08/17/20 1211 08/18/20 0544 08/19/20 0406 08/20/20 0419  AST 32 29 24 41  ALT 21 20 20  38  ALKPHOS 51 47 44 45  BILITOT 0.7 0.4 0.5 0.5  PROT 7.4 6.7 6.2* 6.1*  ALBUMIN 4.0 3.5 3.2* 3.2*   No results for input(s): LIPASE, AMYLASE in the last 168 hours. No results for input(s): AMMONIA in the last 168 hours. Coagulation Profile: Recent Labs  Lab 08/17/20 1511  INR 1.0   Cardiac Enzymes: No results for input(s): CKTOTAL, CKMB, CKMBINDEX, TROPONINI in the last 168 hours. BNP (last 3  results) No results for input(s): PROBNP in the last 8760 hours. HbA1C: No results for input(s): HGBA1C in the last 72 hours. CBG: No results for input(s): GLUCAP in the last 168 hours. Lipid Profile: No results for input(s): CHOL, HDL, LDLCALC, TRIG, CHOLHDL, LDLDIRECT in the last 72 hours. Thyroid Function Tests: No results for input(s): TSH, T4TOTAL, FREET4, T3FREE, THYROIDAB in the last 72 hours. Anemia Panel: Recent Labs    08/19/20 0406 08/20/20 0419  FERRITIN 466* 550*   Sepsis Labs: Recent Labs  Lab 08/17/20 1211 08/17/20 1511 08/17/20 1809  PROCALCITON 0.24  --   --   LATICACIDVEN  --  1.1 1.2    Recent  Results (from the past 240 hour(s))  Blood culture (routine single)     Status: None (Preliminary result)   Collection Time: 08/17/20  3:11 PM   Specimen: BLOOD  Result Value Ref Range Status   Specimen Description BLOOD RIGHT ANTECUBITAL  Final   Special Requests   Final    BOTTLES DRAWN AEROBIC AND ANAEROBIC Blood Culture results may not be optimal due to an inadequate volume of blood received in culture bottles   Culture   Final    NO GROWTH 3 DAYS Performed at [facility], [address] [place], [place] [address]    Report Status PENDING  Incomplete  Resp Panel by RT-PCR (Flu A&B, Covid) Nasopharyngeal Swab     Status: None   Collection Time: 08/17/20  3:11 PM   Specimen: Nasopharyngeal Swab; Nasopharyngeal(NP) swabs in vial transport medium  Result Value Ref Range Status   SARS Coronavirus 2 by RT PCR NEGATIVE NEGATIVE Final    Comment: (NOTE) SARS-CoV-2 target nucleic acids are NOT DETECTED.  The SARS-CoV-2 RNA is generally detectable in upper respiratory specimens during the acute phase of infection. The lowest concentration of SARS-CoV-2 viral copies this assay can detect is 138 copies/mL. A negative result does not preclude SARS-Cov-2 infection and should not be used as the sole basis for treatment or other patient management  decisions. A negative result may occur with  improper specimen collection/handling, submission of specimen other than nasopharyngeal swab, presence of viral mutation(s) within the areas targeted by this assay, and inadequate number of viral copies(<138 copies/mL). A negative result must be combined with clinical observations, patient history, and epidemiological information. The expected result is Negative.  Fact Sheet for Patients:  EntrepreneurPulse.com.au  Fact Sheet for Healthcare Providers:  IncredibleEmployment.be  This test is no t yet approved or cleared by the [place] FDA and  has been authorized for detection and/or diagnosis of SARS-CoV-2 by FDA under an Emergency Use Authorization (EUA). This EUA will remain  in effect (meaning this test can be used) for the duration of the COVID-19 declaration under Section 564(b)(1) of the Act, 21 U.S.C.section 360bbb-3(b)(1), unless the authorization is terminated  or revoked sooner.       Influenza A by PCR NEGATIVE NEGATIVE Final   Influenza B by PCR NEGATIVE NEGATIVE Final    Comment: (NOTE) The Xpert Xpress SARS-CoV-2/FLU/RSV plus assay is intended as an aid in the diagnosis of influenza from Nasopharyngeal swab specimens and should not be used as a sole basis for treatment. Nasal washings and aspirates are unacceptable for Xpert Xpress SARS-CoV-2/FLU/RSV testing.  Fact Sheet for Patients: EntrepreneurPulse.com.au  Fact Sheet for Healthcare Providers: IncredibleEmployment.be  This test is not yet approved or cleared by the [place] FDA and has been authorized for detection and/or diagnosis of SARS-CoV-2 by FDA under an Emergency Use Authorization (EUA). This EUA will remain in effect (meaning this test can be used) for the duration of the COVID-19 declaration under Section 564(b)(1) of the Act, 21 U.S.C. section 360bbb-3(b)(1), unless the  authorization is terminated or revoked.  Performed at [facility], [address], [place], [place] [address]          Radiology Studies: US Venous Img Lower Bilateral (DVT)  Result Date: 08/19/2020 CLINICAL DATA:  Lower extremity pain and elevated D-dimer. EXAM: BILATERAL LOWER EXTREMITY VENOUS DOPPLER ULTRASOUND TECHNIQUE: Gray-scale sonography with graded compression, as well as color Doppler and duplex ultrasound were performed to evaluate the lower extremity deep venous systems from the  level of the common femoral vein and including the common femoral, femoral, profunda femoral, popliteal and calf veins including the posterior tibial, peroneal and gastrocnemius veins when visible. The superficial great saphenous vein was also interrogated. Spectral Doppler was utilized to evaluate flow at rest and with distal augmentation maneuvers in the common femoral, femoral and popliteal veins. COMPARISON:  None. FINDINGS: RIGHT LOWER EXTREMITY Common Femoral Vein: No evidence of thrombus. Normal compressibility, respiratory phasicity and response to augmentation. Saphenofemoral Junction: No evidence of thrombus. Normal compressibility and flow on color Doppler imaging. Profunda Femoral Vein: No evidence of thrombus. Normal compressibility and flow on color Doppler imaging. Femoral Vein: No evidence of thrombus. Normal compressibility, respiratory phasicity and response to augmentation. Popliteal Vein: No evidence of thrombus. Normal compressibility, respiratory phasicity and response to augmentation. Calf Veins: No evidence of thrombus. Normal compressibility and flow on color Doppler imaging. Superficial Great Saphenous Vein: No evidence of thrombus. Normal compressibility. Venous Reflux:  None. Other Findings: No evidence of superficial thrombophlebitis or abnormal fluid collection. LEFT LOWER EXTREMITY Common Femoral Vein: No evidence of thrombus. Normal compressibility, respiratory phasicity  and response to augmentation. Saphenofemoral Junction: No evidence of thrombus. Normal compressibility and flow on color Doppler imaging. Profunda Femoral Vein: No evidence of thrombus. Normal compressibility and flow on color Doppler imaging. Femoral Vein: No evidence of thrombus. Normal compressibility, respiratory phasicity and response to augmentation. Popliteal Vein: No evidence of thrombus. Normal compressibility, respiratory phasicity and response to augmentation. Calf Veins: No evidence of thrombus. Normal compressibility and flow on color Doppler imaging. Superficial Great Saphenous Vein: No evidence of thrombus. Normal compressibility. Venous Reflux:  None. Other Findings: No evidence of superficial thrombophlebitis or abnormal fluid collection. IMPRESSION: No evidence of deep venous thrombosis in either lower extremity. Electronically Signed   By: Aletta Edouard M.D.   On: 08/19/2020 14:40        Scheduled Meds: . albuterol  2 puff Inhalation Q4H  . aspirin EC  81 mg Oral Daily  . calcium-vitamin D  1 tablet Oral Q breakfast  . carvedilol  12.5 mg Oral BID WC  . enoxaparin (LOVENOX) injection  0.5 mg/kg Subcutaneous Q24H  . ezetimibe  10 mg Oral Daily  . furosemide  40 mg Intravenous Daily  . guaiFENesin  1,200 mg Oral BID  . levETIRAcetam  1,000 mg Oral BID  . losartan  50 mg Oral Daily  . methylPREDNISolone (SOLU-MEDROL) injection  1 mg/kg Intravenous Q12H  . mometasone-formoterol  2 puff Inhalation BID  . montelukast  10 mg Oral QHS  . pantoprazole  40 mg Oral Daily  . rOPINIRole  0.5 mg Oral TID  . tiZANidine  4 mg Oral QHS  . umeclidinium bromide  1 puff Inhalation Daily  . vitamin B-12  1,000 mcg Oral Daily  . zinc sulfate  220 mg Oral Daily   Continuous Infusions: . sodium chloride Stopped (08/19/20 2341)  . azithromycin Stopped (08/19/20 2259)  . cefTRIAXone (ROCEPHIN)  IV Stopped (08/19/20 2225)  . vancomycin       LOS: 3 days    Time spent: 35 minutes.      Elmarie Shiley, MD [facility]   If 7PM-7AM, please contact night-coverage [email or web address]  08/20/2020, 12:59 PM

## 2020-08-20 NOTE — Consult Note (Signed)
Pharmacy Antibiotic Note  Lindsay Morse is a 65 y.o. female admitted on 08/17/2020 with cellulitis. Pt presented with shortness of breath, reportedly testing positive for COVID at home on 5/31. CXR concerning for multifocal atypical/viral PNA. PMH includes depression, GERD, anxiety, HTN, COPD, nicotine dependence, CHF, and seizure. Pharmacy has been consulted for vancomycin dosing.  Scr 0.69>0.85, PCT 0.24, afebrile, WBC 6.5  Plan: Will change vancomycin to 1000mg  q24  - Estimated AUC 517, Cmin 12.2  - Used Scr 0.85 and Vd 0.5 to calculate Monitor renal function daily Check levels with 4th dose   Height: 5\' 1"  (154.9 cm) Weight: 82.6 kg (182 lb) IBW/kg (Calculated) : 47.8  Temp (24hrs), Avg:98 F (36.7 C), Min:97.8 F (36.6 C), Max:98.3 F (36.8 C)  Recent Labs  Lab 08/17/20 1211 08/17/20 1511 08/17/20 1809 08/18/20 0544 08/19/20 0406 08/20/20 0419  WBC 7.7  --   --  6.2 6.5 11.8*  CREATININE 0.97  --   --  0.79 0.69 0.85  LATICACIDVEN  --  1.1 1.2  --   --   --     Estimated Creatinine Clearance: 66 mL/min (by C-G formula based on SCr of 0.85 mg/dL).    Allergies  Allergen Reactions  . Regadenoson Other (See Comments)    Trouble breathing  . Statins     Other reaction(s): Muscle Pain Joint pains  . Milnacipran     REACTION: tachycardia    Antimicrobials this admission: 6/4 vancomycin >> 6/2 azithromycin >> 6/6 6/2 ceftriaxone >> 6/6 6/2 remdesivir >> 6/6  Microbiology results: 6/2 BCx: NGTD 6/2 UCx: pending  Thank you for allowing pharmacy to be a part of this patient's care.  Lu Duffel, PharmD, BCPS Clinical Pharmacist 08/20/2020 11:34 AM

## 2020-08-21 LAB — CBC WITH DIFFERENTIAL/PLATELET
Abs Immature Granulocytes: 0.17 10*3/uL — ABNORMAL HIGH (ref 0.00–0.07)
Basophils Absolute: 0 10*3/uL (ref 0.0–0.1)
Basophils Relative: 0 %
Eosinophils Absolute: 0 10*3/uL (ref 0.0–0.5)
Eosinophils Relative: 0 %
HCT: 34.8 % — ABNORMAL LOW (ref 36.0–46.0)
Hemoglobin: 12 g/dL (ref 12.0–15.0)
Immature Granulocytes: 2 %
Lymphocytes Relative: 13 %
Lymphs Abs: 1.6 10*3/uL (ref 0.7–4.0)
MCH: 32.2 pg (ref 26.0–34.0)
MCHC: 34.5 g/dL (ref 30.0–36.0)
MCV: 93.3 fL (ref 80.0–100.0)
Monocytes Absolute: 0.7 10*3/uL (ref 0.1–1.0)
Monocytes Relative: 6 %
Neutro Abs: 9.2 10*3/uL — ABNORMAL HIGH (ref 1.7–7.7)
Neutrophils Relative %: 79 %
Platelets: 309 10*3/uL (ref 150–400)
RBC: 3.73 MIL/uL — ABNORMAL LOW (ref 3.87–5.11)
RDW: 12 % (ref 11.5–15.5)
WBC: 11.6 10*3/uL — ABNORMAL HIGH (ref 4.0–10.5)
nRBC: 0 % (ref 0.0–0.2)

## 2020-08-21 LAB — PHOSPHORUS: Phosphorus: 3.8 mg/dL (ref 2.5–4.6)

## 2020-08-21 LAB — COMPREHENSIVE METABOLIC PANEL
ALT: 50 U/L — ABNORMAL HIGH (ref 0–44)
AST: 31 U/L (ref 15–41)
Albumin: 3.3 g/dL — ABNORMAL LOW (ref 3.5–5.0)
Alkaline Phosphatase: 49 U/L (ref 38–126)
Anion gap: 9 (ref 5–15)
BUN: 33 mg/dL — ABNORMAL HIGH (ref 8–23)
CO2: 30 mmol/L (ref 22–32)
Calcium: 8.4 mg/dL — ABNORMAL LOW (ref 8.9–10.3)
Chloride: 100 mmol/L (ref 98–111)
Creatinine, Ser: 0.84 mg/dL (ref 0.44–1.00)
GFR, Estimated: >60 mL/min (ref >60)
Glucose, Bld: 155 mg/dL — ABNORMAL HIGH (ref 70–99)
Potassium: 3.8 mmol/L (ref 3.5–5.1)
Sodium: 139 mmol/L (ref 135–145)
Total Bilirubin: 0.5 mg/dL (ref 0.3–1.2)
Total Protein: 6.2 g/dL — ABNORMAL LOW (ref 6.5–8.1)

## 2020-08-21 LAB — MAGNESIUM: Magnesium: 2.3 mg/dL (ref 1.7–2.4)

## 2020-08-21 LAB — FERRITIN: Ferritin: 569 ng/mL — ABNORMAL HIGH (ref 11–307)

## 2020-08-21 LAB — D-DIMER, QUANTITATIVE: D-Dimer, Quant: 1.63 ug/mL-FEU — ABNORMAL HIGH (ref 0.00–0.50)

## 2020-08-21 LAB — C-REACTIVE PROTEIN: CRP: 2.1 mg/dL — ABNORMAL HIGH (ref <1.0)

## 2020-08-21 MED ORDER — GUAIFENESIN-DM 100-10 MG/5ML PO SYRP: 5.0000 mL | ORAL_SOLUTION | ORAL | 0 refills | Status: AC | PRN

## 2020-08-21 MED ORDER — GUAIFENESIN ER 600 MG PO TB12: 1200.0000 mg | ORAL_TABLET | Freq: Two times a day (BID) | ORAL | 0 refills | Status: DC

## 2020-08-21 MED ORDER — ZINC SULFATE 220 (50 ZN) MG PO CAPS: 220.0000 mg | ORAL_CAPSULE | Freq: Every day | ORAL | 0 refills | Status: AC

## 2020-08-21 MED ORDER — CVS TRIPLE MAGNESIUM COMPLEX 400 MG PO CAPS
400.0000 mg | ORAL_CAPSULE | ORAL | 0 refills | Status: AC
Start: 2020-08-21 — End: ?

## 2020-08-21 MED ORDER — DOXYCYCLINE HYCLATE 50 MG PO CAPS: 100.0000 mg | ORAL_CAPSULE | Freq: Two times a day (BID) | ORAL | 0 refills | Status: DC

## 2020-08-21 MED ORDER — FUROSEMIDE 40 MG PO TABS: 40.0000 mg | ORAL_TABLET | Freq: Every day | ORAL | 0 refills | Status: DC

## 2020-08-21 MED ORDER — DOXYCYCLINE HYCLATE 50 MG PO CAPS: 100.0000 mg | ORAL_CAPSULE | Freq: Two times a day (BID) | ORAL | 0 refills | Status: AC

## 2020-08-21 MED ORDER — PREDNISONE 10 MG PO TABS: 40.0000 mg | ORAL_TABLET | Freq: Every day | ORAL | 0 refills | Status: AC

## 2020-08-21 MED ORDER — ALBUTEROL SULFATE HFA 108 (90 BASE) MCG/ACT IN AERS: 2.0000 | INHALATION_SPRAY | Freq: Four times a day (QID) | RESPIRATORY_TRACT | 0 refills | Status: DC | PRN

## 2020-08-21 MED ORDER — POTASSIUM CHLORIDE CRYS ER 20 MEQ PO TBCR: 20.0000 meq | EXTENDED_RELEASE_TABLET | Freq: Every day | ORAL | 0 refills | Status: DC

## 2020-08-21 NOTE — Progress Notes (Addendum)
Pt oxygen sats 83% at rest with room air. Oxygen applied at  3L via [place] while ambulating and sats were 86-88%. Sats maintained at 93-95%  with 4L of oxygen during ambulation.

## 2020-08-21 NOTE — Progress Notes (Signed)
AVS reviewed and pt verbalized understanding of all instructions. NAD noted prior to discharge and no concerns voiced. 

## 2020-08-21 NOTE — Discharge Summary (Signed)
Physician Discharge Summary  Lindsay Morse VVO:[id number] DOB: 15-Jan-1956 DOA: 08/17/2020  PCP: Gladstone Lighter, MD  Admit date: 08/17/2020 Discharge date: 08/21/2020  Admitted From: Home  Disposition:  Home   Recommendations for Outpatient Follow-up:  1. Follow up with PCP in 1-2 weeks 2. Please obtain BMP/CBC in one week 3. Follow up resolution of covid and PNA.  4. Follow up resolution of cellulitis.   Home Health: HH PT.  Equipment/Devices: Home oxygen.   Discharge Condition: Stable.  CODE STATUS: Full Code Diet recommendation: Heart Healthy  Brief/Interim Summary: 65 year old with past medical history significant for depression, GERD, anxiety, hypertension, COPD, nicotine dependence, chronic diastolic heart failure, chronic respiratory failure, seizure who presents to the emergency department complaining of shortness of breath, productive cough of clear phlegm, fever, nausea, vomiting and diarrhea since 5/28.  Patient took a home COVID test on 5/29 which was negative then another test on 531 which was positive.  In the emergency department patient COVID PCR was negative. Procedure thickening with patchy airspace opacity.  Hilar and bibasilar regions suspicious for multifocal atypical viral pneumonia.  CTA chest was negative for PE but was consistent with multifocal infection, possible aspiration as well.    1-Acute Hypoxic respiratory failure, possible COVID-19 pneumonia: Patient tested positive by home test on 5/31st, negative PCR on day of admission. Received IV steroids. Received  IV Rocephin and azithromycin for 4 days. Plan to discharge on Doxy.  She has received  2 doses of Remdesivir. Patient Does not want to complete remdesivir tx.  Inflammatory Marker trending down.  Prone position.  Continue with vitamin c, zinc.  She is feeling better, would like to go home/ IV lasix. plan to discharge on oral lasix for few days.  Oxygen down to 3- 4 L. Needs 4 L on exertion. Plan to  arrange Home oxygen.    COVID-19 Labs  Recent Labs (last 2 labs)        Recent Labs    08/18/20 0544 08/19/20 0406 08/20/20 0419  DDIMER 2.61* 2.36* 1.97*  FERRITIN 422* 466* 550*  CRP 15.7* 10.3* 4.1*      Recent Labs       Lab Results  Component Value Date   [place] NEGATIVE 08/17/2020      2-COPD acute exacerbation: Treated with IV steroids. Plan to discharge on prednisone for 5 days.  Continue with inhaler: Dulera, Singulair, albuterol, Incruse. Resume Home Inhaler.  Continue Robitussin as needed Improved.   3-HTN; Continue with Coreg and Cozaar.  4-Seizure disorder: Continue with Keppra.  5-Tobacco abuse: She was started on Wellbutrin as an outpatient.  6-elevation of D-dimer: Related to COVID.  CTA on admission negative for blood clot.  Dopplers of lower extremity negative for DVT.  7-Facial Swelling, BL worse on right. Mild redness. Possible cellulitis.  -Treated  with IV vancomycin. Continue with ceftriaxone.  -repeat IV lasix.  Redness improved. Plan to discharge on Doxy.   8-Hypophosphatemia;  Resolved.     Discharge Diagnoses:  Principal Problem:   Pneumonia due to COVID-19 virus Active Problems:   TOBACCO ABUSE   COPD with acute exacerbation (HCC)   GERD (gastroesophageal reflux disease)   Hypertension   Anxiety   Seizure disorder (HCC)   Chronic diastolic CHF (congestive heart failure) (HCC)   Hyponatremia    Discharge Instructions  Discharge Instructions    Diet - low sodium heart healthy   Complete by: As directed    Increase activity slowly   Complete by: As directed  Allergies as of 08/21/2020      Reactions   Regadenoson Other (See Comments)   Trouble breathing   Statins    Other reaction(s): Muscle Pain Joint pains   Milnacipran    REACTION: tachycardia      Medication List    STOP taking these medications   benzonatate 200 MG capsule Commonly known as: TESSALON   budesonide-formoterol  160-4.5 MCG/ACT inhaler Commonly known as: SYMBICORT   bumetanide 1 MG tablet Commonly known as: BUMEX   chlorpheniramine-HYDROcodone 10-8 MG/5ML Suer Commonly known as: TUSSIONEX   doxycycline 100 MG tablet Commonly known as: VIBRA-TABS Replaced by: doxycycline 50 MG capsule   milk thistle 175 MG tablet   zinc gluconate 50 MG tablet     TAKE these medications   albuterol 108 (90 Base) MCG/ACT inhaler Commonly known as: VENTOLIN HFA Inhale 2 puffs into the lungs every 6 (six) hours as needed for wheezing or shortness of breath. What changed:   when to take this  reasons to take this   aspirin 81 MG EC tablet Take 81 mg by mouth daily.   Breztri Aerosphere 160-9-4.8 MCG/ACT Aero Generic drug: Budeson-Glycopyrrol-Formoterol Inhale 160 mcg into the lungs in the morning and at bedtime.   buPROPion 150 MG 12 hr tablet Commonly known as: WELLBUTRIN SR TAKE ONE TABLET (150MG ) BY MOUTH EVERY DAY FOR THREE DAYS, THEN INCREASE TO ONE TABLET BY MOUTH TWICE DAILY IF TOLERATED.   calcium-vitamin D 500-200 MG-UNIT tablet Commonly known as: OSCAL WITH D Take 1 tablet by mouth.   carvedilol 12.5 MG tablet Commonly known as: COREG Take 12.5 mg by mouth 2 (two) times daily with a meal.   co-enzyme Q-10 30 MG capsule Take 100 mg by mouth daily.   [employer] Triple Magnesium Complex 400 MG Caps Generic drug: Magnesium Take 400 mg by mouth 1 day or 1 dose. What changed: how much to take   diclofenac Sodium 1 % Gel Commonly known as: VOLTAREN Apply 2 g topically daily.   doxycycline 50 MG capsule Commonly known as: VIBRAMYCIN Take 2 capsules (100 mg total) by mouth 2 (two) times daily for 5 days. Replaces: doxycycline 100 MG tablet   ezetimibe 10 MG tablet Commonly known as: ZETIA Take 1 tablet by mouth daily.   furosemide 40 MG tablet Commonly known as: Lasix Take 1 tablet (40 mg total) by mouth daily for 7 days.   guaiFENesin 600 MG 12 hr tablet Commonly known as:  MUCINEX Take 2 tablets (1,200 mg total) by mouth 2 (two) times daily.   guaiFENesin-dextromethorphan 100-10 MG/5ML syrup Commonly known as: ROBITUSSIN DM Take 5 mLs by mouth every 4 (four) hours as needed for cough.   levETIRAcetam 1000 MG tablet Commonly known as: KEPPRA Take 1 tablet by mouth 2 (two) times daily.   lidocaine 5 % Commonly known as: LIDODERM Place 2 patches onto the skin as needed.   losartan 50 MG tablet Commonly known as: COZAAR Take 1 tablet (50 mg total) by mouth daily.   montelukast 10 MG tablet Commonly known as: SINGULAIR Take 10 mg by mouth at bedtime.   pantoprazole 40 MG tablet Commonly known as: PROTONIX Take 1 tablet (40 mg total) by mouth daily.   potassium chloride SA 20 MEQ tablet Commonly known as: KLOR-CON Take 1 tablet (20 mEq total) by mouth daily for 7 days.   predniSONE 10 MG tablet Commonly known as: DELTASONE Take 4 tablets (40 mg total) by mouth daily for 5 days. What changed: See  the new instructions.   rOPINIRole 0.5 MG tablet Commonly known as: REQUIP Take 0.5 mg by mouth 2 (two) times daily.   tiZANidine 4 MG tablet Commonly known as: ZANAFLEX Take 1 tablet (4 mg total) by mouth at bedtime. As needed for muscle spasm.   vitamin B-12 1000 MCG tablet Commonly known as: CYANOCOBALAMIN Take 1,000 mcg by mouth daily.   vitamin C 100 MG tablet Take 100 mg by mouth daily.   zinc sulfate 220 (50 Zn) MG capsule Take 1 capsule (220 mg total) by mouth daily. Start taking on: August 22, 2020            Durable Medical Equipment  (From admission, onward)         Start     Ordered   08/21/20 0933  For home use only DME oxygen  Once       Question Answer Comment  Length of Need 6 Months   Mode or (Route) Nasal cannula   Liters per Minute 4   Frequency Continuous (stationary and portable oxygen unit needed)   Oxygen delivery system Gas      08/21/20 0933   08/19/20 1409  For home use only DME Walker rolling  Once        Question Answer Comment  Walker: With 5 Inch Wheels   Patient needs a walker to treat with the following condition Balance disorder      08/19/20 1408   08/19/20 1409  For home use only DME 3 n 1  Once        08/19/20 1408          Follow-up Information    Gladstone Lighter, MD Follow up in 3 day(s).   Specialty: Internal Medicine Contact information: [place] [address] [phone]        Minna Merritts, MD .   Specialty: Cardiology Contact information: [address] [place] [address] [phone]              Allergies  Allergen Reactions  . Regadenoson Other (See Comments)    Trouble breathing  . Statins     Other reaction(s): Muscle Pain Joint pains  . Milnacipran     REACTION: tachycardia    Consultations:  None   Procedures/Studies: CT Angio Chest PE W and/or Wo Contrast  Result Date: 08/17/2020 CLINICAL DATA:  Suspected pulmonary embolism in a 65 year old female with infiltrate on chest x-ray and shortness of breath. EXAM: CT ANGIOGRAPHY CHEST WITH CONTRAST TECHNIQUE: Multidetector CT imaging of the chest was performed using the standard protocol during bolus administration of intravenous contrast. Multiplanar CT image reconstructions and MIPs were obtained to evaluate the vascular anatomy. CONTRAST:  23mL OMNIPAQUE IOHEXOL 350 MG/ML SOLN COMPARISON:  None. FINDINGS: Cardiovascular: Calcified atheromatous plaque of the thoracic aorta. Normal caliber of the thoracic aorta. Normal heart size. Normal caliber of central pulmonary vessels. Density of the main pulmonary artery at 334 Hounsfield units. No evidence of pulmonary embolism, study adequate for evaluation with mild limitation with respect to subsegmental vessels in the lung bases. Mediastinum/Nodes: RIGHT hilar lymph node at 1.6 cm (image 44/4) Mildly enlarged lymph nodes along the RIGHT paratracheal chain largest approximately 11 mm. Mild fullness of  subcarinal nodal tissue and LEFT hilar nodal tissue. No thoracic inlet lymphadenopathy. No axillary lymphadenopathy. Esophagus grossly normal. Lungs/Pleura: Diffuse peribronchovascular nodularity and ground-glass. More focal area of consolidation in the anterior RIGHT upper lobe on image 56 of series 6. Scattered  nodules throughout the chest none of which were present just over 6 weeks ago. Background pulmonary emphysema. Early consolidative changes at the LEFT lung base as well with predominant feature in this area bronchovascular nodularity and tree-in-bud opacities. No sign of effusion. Upper Abdomen: Incidental imaging of upper abdominal contents without acute process. Musculoskeletal: No acute musculoskeletal findings. Spinal degenerative changes. Review of the MIP images confirms the above findings. IMPRESSION: 1. No evidence of pulmonary embolism. 2. Signs of multifocal infection, developed in the interval since imaging from April. Would consider atypical infectious etiologies as well as aspiration related changes given the appearance of LEFT lower lobe findings. 3. Given more focal consolidative changes and adenopathy in the RIGHT chest would suggest attention on follow-up to ensure resolution. 4. Emphysema and aortic atherosclerosis. Aortic Atherosclerosis (ICD10-I70.0) and Emphysema (ICD10-J43.9). Electronically Signed   By: Zetta Bills M.D.   On: 08/17/2020 17:55   US Venous Img Lower Bilateral (DVT)  Result Date: 08/19/2020 CLINICAL DATA:  Lower extremity pain and elevated D-dimer. EXAM: BILATERAL LOWER EXTREMITY VENOUS DOPPLER ULTRASOUND TECHNIQUE: Gray-scale sonography with graded compression, as well as color Doppler and duplex ultrasound were performed to evaluate the lower extremity deep venous systems from the level of the common femoral vein and including the common femoral, femoral, profunda femoral, popliteal and calf veins including the posterior tibial, peroneal and gastrocnemius veins  when visible. The superficial great saphenous vein was also interrogated. Spectral Doppler was utilized to evaluate flow at rest and with distal augmentation maneuvers in the common femoral, femoral and popliteal veins. COMPARISON:  None. FINDINGS: RIGHT LOWER EXTREMITY Common Femoral Vein: No evidence of thrombus. Normal compressibility, respiratory phasicity and response to augmentation. Saphenofemoral Junction: No evidence of thrombus. Normal compressibility and flow on color Doppler imaging. Profunda Femoral Vein: No evidence of thrombus. Normal compressibility and flow on color Doppler imaging. Femoral Vein: No evidence of thrombus. Normal compressibility, respiratory phasicity and response to augmentation. Popliteal Vein: No evidence of thrombus. Normal compressibility, respiratory phasicity and response to augmentation. Calf Veins: No evidence of thrombus. Normal compressibility and flow on color Doppler imaging. Superficial Great Saphenous Vein: No evidence of thrombus. Normal compressibility. Venous Reflux:  None. Other Findings: No evidence of superficial thrombophlebitis or abnormal fluid collection. LEFT LOWER EXTREMITY Common Femoral Vein: No evidence of thrombus. Normal compressibility, respiratory phasicity and response to augmentation. Saphenofemoral Junction: No evidence of thrombus. Normal compressibility and flow on color Doppler imaging. Profunda Femoral Vein: No evidence of thrombus. Normal compressibility and flow on color Doppler imaging. Femoral Vein: No evidence of thrombus. Normal compressibility, respiratory phasicity and response to augmentation. Popliteal Vein: No evidence of thrombus. Normal compressibility, respiratory phasicity and response to augmentation. Calf Veins: No evidence of thrombus. Normal compressibility and flow on color Doppler imaging. Superficial Great Saphenous Vein: No evidence of thrombus. Normal compressibility. Venous Reflux:  None. Other Findings: No evidence of  superficial thrombophlebitis or abnormal fluid collection. IMPRESSION: No evidence of deep venous thrombosis in either lower extremity. Electronically Signed   By: Aletta Edouard M.D.   On: 08/19/2020 14:40   DG Chest Port 1 View  Result Date: 08/17/2020 CLINICAL DATA:  COVID-19 positive, shortness of breath, cough EXAM: PORTABLE CHEST 1 VIEW COMPARISON:  06/27/2020 FINDINGS: Cardiomediastinal contours are within normal limits. Atherosclerotic calcification of the aortic knob. Interstitial thickening with patchy airspace opacities within the perihilar and bibasilar regions. No pleural effusion or pneumothorax. IMPRESSION: Interstitial thickening with patchy airspace opacities within the perihilar and bibasilar regions suspicious for multifocal atypical/viral  pneumonia. Electronically Signed   By: Davina Poke D.O.   On: 08/17/2020 14:55      Subjective: She is breathing better. Cough improved  Discharge Exam: Vitals:   08/21/20 0449 08/21/20 0829  BP: 137/78 (!) 155/78  Pulse: 86 79  Resp: 20 20  Temp: 97.7 F (36.5 C) 97.9 F (36.6 C)  SpO2: 95% 94%     General: Pt is alert, awake, not in acute distress Cardiovascular: RRR, S1/S2 +, no rubs, no gallops Respiratory: CTA bilaterally, no wheezing, no rhonchi Abdominal: Soft, NT, ND, bowel sounds + Extremities: no edema, no cyanosis    The results of significant diagnostics from this hospitalization (including imaging, microbiology, ancillary and laboratory) are listed below for reference.     Microbiology: Recent Results (from the past 240 hour(s))  Blood culture (routine single)     Status: None (Preliminary result)   Collection Time: 08/17/20  3:11 PM   Specimen: BLOOD  Result Value Ref Range Status   Specimen Description BLOOD RIGHT ANTECUBITAL  Final   Special Requests   Final    BOTTLES DRAWN AEROBIC AND ANAEROBIC Blood Culture results may not be optimal due to an inadequate volume of blood received in culture  bottles   Culture   Final    NO GROWTH 4 DAYS Performed at [facility], [address] [place], [place] [address]    Report Status PENDING  Incomplete  Resp Panel by RT-PCR (Flu A&B, Covid) Nasopharyngeal Swab     Status: None   Collection Time: 08/17/20  3:11 PM   Specimen: Nasopharyngeal Swab; Nasopharyngeal(NP) swabs in vial transport medium  Result Value Ref Range Status   SARS Coronavirus 2 by RT PCR NEGATIVE NEGATIVE Final    Comment: (NOTE) SARS-CoV-2 target nucleic acids are NOT DETECTED.  The SARS-CoV-2 RNA is generally detectable in upper respiratory specimens during the acute phase of infection. The lowest concentration of SARS-CoV-2 viral copies this assay can detect is 138 copies/mL. A negative result does not preclude SARS-Cov-2 infection and should not be used as the sole basis for treatment or other patient management decisions. A negative result may occur with  improper specimen collection/handling, submission of specimen other than nasopharyngeal swab, presence of viral mutation(s) within the areas targeted by this assay, and inadequate number of viral copies(<138 copies/mL). A negative result must be combined with clinical observations, patient history, and epidemiological information. The expected result is Negative.  Fact Sheet for Patients:  EntrepreneurPulse.com.au  Fact Sheet for Healthcare Providers:  IncredibleEmployment.be  This test is no t yet approved or cleared by the [place] FDA and  has been authorized for detection and/or diagnosis of SARS-CoV-2 by FDA under an Emergency Use Authorization (EUA). This EUA will remain  in effect (meaning this test can be used) for the duration of the COVID-19 declaration under Section 564(b)(1) of the Act, 21 U.S.C.section 360bbb-3(b)(1), unless the authorization is terminated  or revoked sooner.       Influenza A by PCR NEGATIVE NEGATIVE Final   Influenza  B by PCR NEGATIVE NEGATIVE Final    Comment: (NOTE) The Xpert Xpress SARS-CoV-2/FLU/RSV plus assay is intended as an aid in the diagnosis of influenza from Nasopharyngeal swab specimens and should not be used as a sole basis for treatment. Nasal washings and aspirates are unacceptable for Xpert Xpress SARS-CoV-2/FLU/RSV testing.  Fact Sheet for Patients: EntrepreneurPulse.com.au  Fact Sheet for Healthcare Providers: IncredibleEmployment.be  This test is not yet approved or cleared by the [place]  States FDA and has been authorized for detection and/or diagnosis of SARS-CoV-2 by FDA under an Emergency Use Authorization (EUA). This EUA will remain in effect (meaning this test can be used) for the duration of the COVID-19 declaration under Section 564(b)(1) of the Act, 21 U.S.C. section 360bbb-3(b)(1), unless the authorization is terminated or revoked.  Performed at [facility], [address] [place], [place] [address]      Labs: BNP (last 3 results) No results for input(s): BNP in the last 8760 hours. Basic Metabolic Panel: Recent Labs  Lab 08/17/20 1211 08/17/20 1511 08/18/20 0544 08/19/20 0406 08/20/20 0419 08/21/20 0435  NA 131*  --  136 139 141 139  K 3.8  --  4.2 4.3 4.0 3.8  CL 97*  --  102 104 101 100  CO2 22  --  25 27 30 30   GLUCOSE 131*  --  147* 159* 171* 155*  BUN 22  --  23 27* 33* 33*  CREATININE 0.97  --  0.79 0.69 0.85 0.84  CALCIUM 8.7*  --  8.3* 8.9 8.6* 8.4*  MG  --  2.1 2.2 2.2 2.3 2.3  PHOS  --   --  3.7 2.4* 3.6 3.8   Liver Function Tests: Recent Labs  Lab 08/17/20 1211 08/18/20 0544 08/19/20 0406 08/20/20 0419 08/21/20 0435  AST 32 29 24 41 31  ALT 21 20 20  38 50*  ALKPHOS 51 47 44 45 49  BILITOT 0.7 0.4 0.5 0.5 0.5  PROT 7.4 6.7 6.2* 6.1* 6.2*  ALBUMIN 4.0 3.5 3.2* 3.2* 3.3*   No results for input(s): LIPASE, AMYLASE in the last 168 hours. No results for input(s): AMMONIA in the last  168 hours. CBC: Recent Labs  Lab 08/17/20 1211 08/18/20 0544 08/19/20 0406 08/20/20 0419 08/21/20 0435  WBC 7.7 6.2 6.5 11.8* 11.6*  NEUTROABS 6.1 5.3 4.8 9.5* 9.2*  HGB 13.4 12.7 12.0 11.6* 12.0  HCT 38.5 35.7* 34.6* 34.1* 34.8*  MCV 93.4 93.5 94.3 95.3 93.3  PLT 192 194 209 255 309   Cardiac Enzymes: No results for input(s): CKTOTAL, CKMB, CKMBINDEX, TROPONINI in the last 168 hours. BNP: Invalid input(s): POCBNP CBG: No results for input(s): GLUCAP in the last 168 hours. D-Dimer Recent Labs    08/20/20 0419 08/21/20 0435  DDIMER 1.97* 1.63*   Hgb A1c No results for input(s): HGBA1C in the last 72 hours. Lipid Profile No results for input(s): CHOL, HDL, LDLCALC, TRIG, CHOLHDL, LDLDIRECT in the last 72 hours. Thyroid function studies No results for input(s): TSH, T4TOTAL, T3FREE, THYROIDAB in the last 72 hours.  Invalid input(s): FREET3 Anemia work up Recent Labs    08/20/20 0419 08/21/20 0435  FERRITIN 550* 569*   Urinalysis    Component Value Date/Time   COLORURINE Straw 10/03/2013 2333   COLORURINE yellow 09/25/2009 1503   APPEARANCEUR Clear 10/03/2013 2333   LABSPEC 1.004 10/03/2013 2333   PHURINE 7.0 10/03/2013 2333   PHURINE 6.0 09/25/2009 1503   GLUCOSEU Negative 10/03/2013 2333   HGBUR Negative 10/03/2013 2333   HGBUR negative 09/25/2009 1503   BILIRUBINUR Negative 10/03/2013 2333   KETONESUR Negative 10/03/2013 2333   PROTEINUR Negative 10/03/2013 2333   UROBILINOGEN 0.2 10/30/2012 0949   UROBILINOGEN 0.2 09/25/2009 1503   NITRITE Negative 10/03/2013 2333   NITRITE negative 09/25/2009 1503   LEUKOCYTESUR Negative 10/03/2013 2333   Sepsis Labs Invalid input(s): PROCALCITONIN,  WBC,  LACTICIDVEN Microbiology Recent Results (from the past 240 hour(s))  Blood culture (routine single)  Status: None (Preliminary result)   Collection Time: 08/17/20  3:11 PM   Specimen: BLOOD  Result Value Ref Range Status   Specimen Description BLOOD RIGHT  ANTECUBITAL  Final   Special Requests   Final    BOTTLES DRAWN AEROBIC AND ANAEROBIC Blood Culture results may not be optimal due to an inadequate volume of blood received in culture bottles   Culture   Final    NO GROWTH 4 DAYS Performed at [facility], [address] [place], [place] [address]    Report Status PENDING  Incomplete  Resp Panel by RT-PCR (Flu A&B, Covid) Nasopharyngeal Swab     Status: None   Collection Time: 08/17/20  3:11 PM   Specimen: Nasopharyngeal Swab; Nasopharyngeal(NP) swabs in vial transport medium  Result Value Ref Range Status   SARS Coronavirus 2 by RT PCR NEGATIVE NEGATIVE Final    Comment: (NOTE) SARS-CoV-2 target nucleic acids are NOT DETECTED.  The SARS-CoV-2 RNA is generally detectable in upper respiratory specimens during the acute phase of infection. The lowest concentration of SARS-CoV-2 viral copies this assay can detect is 138 copies/mL. A negative result does not preclude SARS-Cov-2 infection and should not be used as the sole basis for treatment or other patient management decisions. A negative result may occur with  improper specimen collection/handling, submission of specimen other than nasopharyngeal swab, presence of viral mutation(s) within the areas targeted by this assay, and inadequate number of viral copies(<138 copies/mL). A negative result must be combined with clinical observations, patient history, and epidemiological information. The expected result is Negative.  Fact Sheet for Patients:  EntrepreneurPulse.com.au  Fact Sheet for Healthcare Providers:  IncredibleEmployment.be  This test is no t yet approved or cleared by the [place] FDA and  has been authorized for detection and/or diagnosis of SARS-CoV-2 by FDA under an Emergency Use Authorization (EUA). This EUA will remain  in effect (meaning this test can be used) for the duration of the COVID-19 declaration under  Section 564(b)(1) of the Act, 21 U.S.C.section 360bbb-3(b)(1), unless the authorization is terminated  or revoked sooner.       Influenza A by PCR NEGATIVE NEGATIVE Final   Influenza B by PCR NEGATIVE NEGATIVE Final    Comment: (NOTE) The Xpert Xpress SARS-CoV-2/FLU/RSV plus assay is intended as an aid in the diagnosis of influenza from Nasopharyngeal swab specimens and should not be used as a sole basis for treatment. Nasal washings and aspirates are unacceptable for Xpert Xpress SARS-CoV-2/FLU/RSV testing.  Fact Sheet for Patients: EntrepreneurPulse.com.au  Fact Sheet for Healthcare Providers: IncredibleEmployment.be  This test is not yet approved or cleared by the [place] FDA and has been authorized for detection and/or diagnosis of SARS-CoV-2 by FDA under an Emergency Use Authorization (EUA). This EUA will remain in effect (meaning this test can be used) for the duration of the COVID-19 declaration under Section 564(b)(1) of the Act, 21 U.S.C. section 360bbb-3(b)(1), unless the authorization is terminated or revoked.  Performed at [facility], [address] [place], [place] [address]      Time coordinating discharge: 40 minutes  SIGNED:   Elmarie Shiley, MD  [facility]

## 2020-08-21 NOTE — TOC Progression Note (Addendum)
Transition of Care [facility]) - Progression Note    Patient Details  Name: Lindsay Morse MRN: [id number] Date of Birth: 08/13/1955  Transition of Care [facility]) CM/SW [place], LCSW Phone Number: 08/21/2020, 10:24 AM  Clinical Narrative: Per RN, MD planning on discharging home today. Patient weaned down to 3 L. RN will do sats test so CSW can order oxygen. RN said MD asking about which pharmacy to send prescriptions to so they can be delivered to her home. Patient has 3 pharmacies listed: [facility] in [place] in [place], and [facility] [place] calling in room to get patient's preference but no answer. Patient on airborne precautions. RN will ask her when she goes in to do sats test. [place] representative is aware of plan for discharge today.  11:23 am: Ordered oxygen through [facility].  Expected Discharge Plan: [place] Barriers to Discharge: Continued Medical Work up  Expected Discharge Plan and Services Expected Discharge Plan: [place] arrangements for the past 2 months: Single Family Home                 DME Arranged: [place] Rile rolling,3-N-1 DME Agency:  [facility]) Date DME Agency Contacted: 08/19/20   Representative spoke with at DME Agency: Melene Muller [place]: PT,OT [place]: [place] ([place]) Date [place]: 08/19/20   Representative spoke with at [place]: Floydene Flock   Social Determinants of Health ([place]) Interventions    Readmission Risk Interventions No flowsheet data found.

## 2020-08-21 NOTE — Progress Notes (Signed)
Physical Therapy Treatment Patient Details Name: Lindsay Morse MRN: [id number] DOB: 02-05-1956 Today's Date: 08/21/2020    History of Present Illness Pt is a 65 y.o. female with medical history significant for depression, GERD, anxiety, hypertension, COPD, nicotine dependence, chronic diastolic dysfunction CHF, chronic respiratory failure, seizure who presents to the emergency for evaluation of shortness of breath, cough productive of clear phlegm, fever, nausea, vomiting and diarrhea since Saturday 5/28. She took a home COVID test on 5/29 which was negative, then another test on 5/31 which was positive, PCR test here negative.    PT Comments    To 3 lpm per rn request.  Able to walk 2 laps in room but sats dec to 83-86% despite deep breathing.  Education and encergy conservation techniques reviewed.  Voiced understanding.  Discussed with MD and RN and pt increased back to 4 lpm.Pt feels comfortable with discharge.   Follow Up Recommendations  Home health PT;Supervision - Intermittent     Equipment Recommendations  Rolling walker with 5" wheels    Recommendations for Other Services       Precautions / Restrictions Precautions Precautions: Fall Precaution Comments: monitor SpO2 with exertion Restrictions Weight Bearing Restrictions: No    Mobility  Bed Mobility Overal bed mobility: Modified Independent Bed Mobility: Supine to Sit;Sit to Supine     Supine to sit: Modified independent (Device/Increase time)          Transfers Overall transfer level: Modified independent Equipment used: None                Ambulation/Gait Ambulation/Gait assistance: Supervision Gait Distance (Feet): 60 Feet Assistive device: None Gait Pattern/deviations: Step-through pattern Gait velocity: dec       Stairs             Wheelchair Mobility    Modified Rankin (Stroke Patients Only)       Balance Overall balance assessment: Modified Independent                                           Cognition Arousal/Alertness: Awake/alert Behavior During Therapy: WFL for tasks assessed/performed Overall Cognitive Status: Within Functional Limits for tasks assessed                                        Exercises      General Comments        Pertinent Vitals/Pain Pain Assessment: No/denies pain    Home Living                      Prior Function            PT Goals (current goals can now be found in the care plan section) Progress towards PT goals: Progressing toward goals    Frequency    Min 2X/week      PT Plan      Co-evaluation              AM-PAC PT "6 Clicks" Mobility   Outcome Measure  Help needed turning from your back to your side while in a flat bed without using bedrails?: None Help needed moving from lying on your back to sitting on the side of a flat bed without using bedrails?: None Help needed moving to and from a bed  to a chair (including a wheelchair)?: None Help needed standing up from a chair using your arms (e.g., wheelchair or bedside chair)?: None Help needed to walk in hospital room?: None Help needed climbing 3-5 steps with a railing? : A Little 6 Click Score: 23    End of Session Equipment Utilized During Treatment: Gait belt Activity Tolerance: Patient limited by fatigue Patient left: in chair;with call bell/phone within reach;with chair alarm set Nurse Communication: Mobility status PT Visit Diagnosis: Other abnormalities of gait and mobility (R26.89);Muscle weakness (generalized) (M62.81);Difficulty in walking, not elsewhere classified (R26.2)     Time: 0150-0215 PT Time Calculation (min) (ACUTE ONLY): 25 min  Charges:  $Gait Training: 23-37 mins                    Chesley Noon, PTA 08/21/20, 2:20 PM

## 2020-08-21 NOTE — Care Management Important Message (Signed)
Important Message  Patient Details  Name: Lindsay Morse MRN: [id number] Date of Birth: 24-Apr-1955   Medicare Important Message Given:  Other (see comment)  Attempted to reach patient via room phone to review Medicare IM due to isolation status. Unable to reach at this time.    Dannette Barbara 08/21/2020, 2:40 PM

## 2020-08-21 NOTE — Plan of Care (Signed)
  Problem: Education: Goal: Knowledge of General Education information will improve Description: Including pain rating scale, medication(s)/side effects and non-pharmacologic comfort measures Outcome: Progressing   Problem: Clinical Measurements: Goal: Respiratory complications will improve Outcome: Progressing   Problem: Coping: Goal: Psychosocial and spiritual needs will be supported Outcome: Progressing   Problem: Respiratory: Goal: Will maintain a patent airway Outcome: Progressing Goal: Complications related to the disease process, condition or treatment will be avoided or minimized Outcome: Progressing

## 2020-08-21 NOTE — TOC Transition Note (Addendum)
Transition of Care [facility]) - CM/SW Discharge Note   Patient Details  Name: Lindsay Morse MRN: [id number] Date of Birth: [birth year]  Transition of Care [facility]) CM/SW Contact:  Candie Chroman, LCSW Phone Number: 08/21/2020, 1:26 PM   Clinical Narrative:  Patient has orders to discharge home today. [place] representative is aware. 3-in-1 and walker are outside of room. [place] representative has confirmed that oxygen was delivered to the room. No further concerns. CSW signing off.   3:30 pm: [employer] door-to-door service will take patient home today. RN confirmed address on facesheet is correct. Rider Warden/ranger to [employer] They are aware she will be bringing DME home. CSW signing off.  Final next level of care: [place] Barriers to Discharge: Barriers Resolved   Patient Goals and CMS Choice Patient states their goals for this hospitalization and ongoing recovery are:: home with home health CMS Medicare.gov Compare Post Acute Care list provided to:: Patient Choice offered to / list presented to : Patient  Discharge Placement                    Patient and family notified of of transfer: 08/21/20  Discharge Plan and Services                DME Arranged: 3-N-1,Walker rolling,Oxygen DME Agency: [place] [facility]) Date DME Agency Contacted: 08/21/20   Representative spoke with at DME Agency: [employer]: Verita Lamb: [place]: PT,OT [place]: [place] ([place]) Date [place]: 08/21/20   Representative spoke with at [place]: Floydene Flock  Social Determinants of Health (SDOH) Interventions     Readmission Risk Interventions No flowsheet data found.

## 2020-08-22 LAB — CULTURE, BLOOD (SINGLE): Culture: NO GROWTH

## 2020-08-31 ENCOUNTER — Telehealth: Payer: Self-pay | Admitting: Pulmonary Disease

## 2020-08-31 MED ORDER — BREZTRI AEROSPHERE 160-9-4.8 MCG/ACT IN AERO: 160.0000 ug | INHALATION_SPRAY | Freq: Two times a day (BID) | RESPIRATORY_TRACT | 3 refills | Status: DC

## 2020-08-31 NOTE — Telephone Encounter (Signed)
Spoke to patient, who stated that she had a recent admission for PNA and CHF. Discharged on 4L.  She followed up with PCP on 08/28/2020 and was prescribed another round of Levaquin and prednisone.  Breathing has slightly improved since discharged. C/o prod cough with clear sputum, wheezing and sob with exertion.  Appt scheduled for 09/12/2020 at 10:30. Rx for Judithann Sauger has been sent to preferred pharmacy per patient request.  Nothing further needed at this time.   Routing to Dr. Patsey Berthold as an Juluis Rainier.

## 2020-09-01 NOTE — Telephone Encounter (Signed)
Noted  

## 2020-09-12 ENCOUNTER — Other Ambulatory Visit: Payer: Self-pay

## 2020-09-12 ENCOUNTER — Ambulatory Visit (INDEPENDENT_AMBULATORY_CARE_PROVIDER_SITE_OTHER): Payer: Medicare Other | Admitting: Pulmonary Disease

## 2020-09-12 ENCOUNTER — Encounter: Payer: Self-pay | Admitting: Pulmonary Disease

## 2020-09-12 VITALS — BP 114/72 | HR 86 | Temp 97.4°F | Ht 61.0 in | Wt 179.6 lb

## 2020-09-12 DIAGNOSIS — J189 Pneumonia, unspecified organism: Secondary | ICD-10-CM

## 2020-09-12 DIAGNOSIS — J449 Chronic obstructive pulmonary disease, unspecified: Secondary | ICD-10-CM | POA: Diagnosis not present

## 2020-09-12 DIAGNOSIS — F17210 Nicotine dependence, cigarettes, uncomplicated: Secondary | ICD-10-CM | POA: Diagnosis not present

## 2020-09-12 DIAGNOSIS — K219 Gastro-esophageal reflux disease without esophagitis: Secondary | ICD-10-CM | POA: Diagnosis not present

## 2020-09-12 MED ORDER — ALBUTEROL SULFATE HFA 108 (90 BASE) MCG/ACT IN AERS: 2.0000 | INHALATION_SPRAY | Freq: Four times a day (QID) | RESPIRATORY_TRACT | 3 refills | Status: DC | PRN

## 2020-09-12 MED ORDER — ALBUTEROL SULFATE HFA 108 (90 BASE) MCG/ACT IN AERS: 2.0000 | INHALATION_SPRAY | Freq: Four times a day (QID) | RESPIRATORY_TRACT | 0 refills | Status: AC | PRN

## 2020-09-12 MED ORDER — BREZTRI AEROSPHERE 160-9-4.8 MCG/ACT IN AERO: 2.0000 | INHALATION_SPRAY | Freq: Two times a day (BID) | RESPIRATORY_TRACT | 0 refills | Status: AC

## 2020-09-12 NOTE — Patient Instructions (Signed)
We I given you some samples of Breztri until you get your order in.  Your oxygen level maintain well today.  I suspect this will continue to improve.  We are discontinuing oxygen.  We will see him in follow-up in 4 to 6 weeks time with either me or the nurse practitioner.

## 2020-09-12 NOTE — Progress Notes (Signed)
Subjective:    Patient ID: Lindsay Morse, female    DOB: [birth year], 65 y.o.   MRN: [id number] Chief Complaint  Patient presents with   Follow-up    Recent admission. Breathing has improved since d/c. C/o prod cough with white sputum, wheezing and occ sob with exertion. D/c with 2L, but has ot worn since d/c.     HPI  Patient is a 65 year old current smoker (3 cigarettes/day since 2 June) who presents for follow-up on the issue of COPD and cough.  This is a scheduled visit.  The patient has been doing better on Breztri 2 puffs twice a day and as needed albuterol.  However her cough has worsened since a bout of pneumonia which was diagnosed in 2 June.  She also has run out of her Judithann Sauger and is awaiting mail order to be fulfilled.  The patient presented on 2 June to [facility] with shortness of breath and cough productive of yellowish sputum.  She also had subjective fevers.  She had had 3 COVID home tests that were negative for with the exception of one which she was not sure of when felt that it was equivocal and not a clear-cut positive.  She has had several PCR's that have been negative including in the emergency department.  Her chest CT showed multifocal infection that was more consistent with aspiration.  The pattern did not look classic for COVID-19.  She was however admitted with the possibility of COVID-19 pneumonia and started on remdesivir and given Rocephin and azithromycin.  She also required supplemental oxygen due to hypoxemia noted on admission.  She was subsequently discharged on 6 June after "pleading" with the physicians to let her go.  She felt she could get better at home.  She was given a prescription of Judithann Sauger which has not been fulfilled yet, she did not receive extra antibiotic.  She has seen Dr. Ola Spurr at [place] [facility] who has requested a sputum culture her COVID-19 antibodies were negative.  Overall she feels like she is getting somewhat better slowly.  She was discharged on 2  L/min nasal cannula O2 however she has since discontinued using it as her oxygen saturations have been 90% or better at home.  She does not endorse any other symptomatology today.  Curtailed her cigarette use to down to 3 cigarettes/day from back per day.  She still has not had pulmonary function testing.   Review of Systems A 10 point review of systems was performed and it is as noted above otherwise negative.  Patient Active Problem List   Diagnosis Date Noted   Pneumonia due to COVID-19 virus 08/17/2020   Chronic diastolic CHF (congestive heart failure) ([place]) 08/17/2020   Hyponatremia 08/17/2020   PAD (peripheral artery disease) ([place]) 01/01/2020   Aortic atherosclerosis ([place]) 01/01/2020   Anxiety 09/14/2019   Lung nodule 09/14/2019   Seborrheic keratoses 09/14/2019   Acute pain of right knee 09/14/2019   Hypertension 10/07/2018   Seizure disorder ([place]) 02/18/2014   Syncope 10/14/2013   SOB (shortness of breath) 10/14/2013   Aortic calcification, 04/07/2012 CT Chest 05/28/2013   GERD (gastroesophageal reflux disease) 05/28/2013   Carotid artery calcification 05/28/2013   Obesity (BMI 30-39.9) 05/28/2013   Herniation of cervical intervertebral disc with radiculopathy 05/28/2013   Hemoptysis 03/25/2012   COPD with acute exacerbation ([place]) 09/18/2011   COPD with emphysema ([place]) 06/03/2011   COLONIC POLYPS, HX OF 08/30/2009   IBS 06/26/2009   TOBACCO ABUSE 04/07/2009   SLEEP DISORDER 09/26/2008  Hyperlipidemia 08/19/2008   Major depressive disorder, recurrent episode, in partial remission ([place]) 08/09/2008   Fibromyalgia 11/18/2006   Restless leg syndrome 11/18/2006   Social History   Tobacco Use   Smoking status: Every Day    Packs/day: 1.50    Years: 43.00    Pack years: 64.50    Types: Cigarettes   Smokeless tobacco: Never   Tobacco comments:    3 cigs daily--09/12/2020  Substance Use Topics   Alcohol use: No    Alcohol/week: 1.0 - 2.0 standard drink    Types: 1 - 2  Glasses of wine per week   Allergies  Allergen Reactions   Regadenoson Other (See Comments)    Trouble breathing   Statins     Other reaction(s): Muscle Pain Joint pains   Milnacipran     REACTION: tachycardia   Current Meds  Medication Sig   albuterol (VENTOLIN HFA) 108 (90 Base) MCG/ACT inhaler Inhale 2 puffs into the lungs every 6 (six) hours as needed for wheezing or shortness of breath.   Ascorbic Acid (VITAMIN C) 100 MG tablet Take 100 mg by mouth daily.   aspirin 81 MG EC tablet Take 81 mg by mouth daily.   Budeson-Glycopyrrol-Formoterol (BREZTRI AEROSPHERE) 160-9-4.8 MCG/ACT AERO Inhale 160 mcg into the lungs in the morning and at bedtime.   buPROPion (WELLBUTRIN SR) 150 MG 12 hr tablet TAKE ONE TABLET (150MG ) BY MOUTH EVERY DAY FOR THREE DAYS, THEN INCREASE TO ONE TABLET BY MOUTH TWICE DAILY IF TOLERATED.   calcium-vitamin D (OSCAL WITH D) 500-200 MG-UNIT tablet Take 1 tablet by mouth.   carvedilol (COREG) 12.5 MG tablet Take 12.5 mg by mouth 2 (two) times daily with a meal.   co-enzyme Q-10 30 MG capsule Take 100 mg by mouth daily.   diclofenac Sodium (VOLTAREN) 1 % GEL Apply 2 g topically daily.   ezetimibe (ZETIA) 10 MG tablet Take 1 tablet by mouth daily.   guaiFENesin (MUCINEX) 600 MG 12 hr tablet Take 2 tablets (1,200 mg total) by mouth 2 (two) times daily.   guaiFENesin-dextromethorphan (ROBITUSSIN DM) 100-10 MG/5ML syrup Take 5 mLs by mouth every 4 (four) hours as needed for cough.   levETIRAcetam (KEPPRA) 1000 MG tablet Take 1 tablet by mouth 2 (two) times daily.   lidocaine (LIDODERM) 5 % Place 2 patches onto the skin as needed.   losartan (COZAAR) 50 MG tablet Take 1 tablet (50 mg total) by mouth daily.   Magnesium ([employer]S TRIPLE MAGNESIUM COMPLEX) 400 MG CAPS Take 400 mg by mouth 1 day or 1 dose.   montelukast (SINGULAIR) 10 MG tablet Take 10 mg by mouth at bedtime.   pantoprazole (PROTONIX) 40 MG tablet Take 1 tablet (40 mg total) by mouth daily.   rOPINIRole  (REQUIP) 0.5 MG tablet Take 0.5 mg by mouth 2 (two) times daily.   tiZANidine (ZANAFLEX) 4 MG tablet Take 1 tablet (4 mg total) by mouth at bedtime. As needed for muscle spasm.   vitamin B-12 (CYANOCOBALAMIN) 1000 MCG tablet Take 1,000 mcg by mouth daily.   zinc sulfate 220 (50 Zn) MG capsule Take 1 capsule (220 mg total) by mouth daily.   Immunization History  Administered Date(s) Administered   Influenza Split 12/23/2013   Influenza Whole 01/30/2010   Influenza, Seasonal, Injecte, Preservative Fre 03/30/2012   Influenza-Unspecified 12/17/2019   Pneumococcal-Unspecified 12/23/2019   Td 03/18/2006   Tdap 04/05/2020       Objective:   Physical Exam BP 114/72 (BP Location: Left Arm,  Cuff Size: Normal)   Pulse 86   Temp (!) 97.4 F (36.3 C) (Temporal)   Ht 5\' 1"  (1.549 m)   Wt 179 lb 9.6 oz (81.5 kg)   SpO2 95%   BMI 33.94 kg/m  GENERAL: Well-developed, overweight woman, no acute distress.  Mildly tachypneic.  Fully ambulatory. HEAD: Normocephalic, atraumatic. EYES: Pupils equal, round, reactive to light.  No scleral icterus. MOUTH: Nose/mouth/throat not examined due to masking requirements for COVID 19. NECK: Supple. No thyromegaly. Trachea midline. No JVD.  No adenopathy. PULMONARY: Good air entry bilaterally.  Few rhonchi, otherwise, no other adventitious sounds. CARDIOVASCULAR: S1 and S2. Regular rate and rhythm.  No rubs, murmurs or gallops heard. ABDOMEN: Benign. MUSCULOSKELETAL: No joint deformity, no clubbing, no edema. NEUROLOGIC: No focal deficit, no gait disturbance, speech is fluent. SKIN: Intact,warm,dry.  On limited exam, no rashes. PSYCH: Mood and behavior normal.  Representative image from CT performed 17 August 2020, CT showed no PE, evidence of multifocal infection with lobar pneumonia/consolidative changes on the right.  And left lower lobe infiltrates as well.  These changes are NOT CONSISTENT with typical COVID-19 infiltrative changes.  More consistent with  bacterial pneumonia possible aspiration:      Assessment & Plan:     ICD-10-CM   1. Pneumonia of both lungs due to infectious organism, unspecified part of lung  J18.9 CT CHEST WO CONTRAST   Pattern more consistent with community acquired pneumonia No organism specified  Query aspiration Repeat CT 8 to 12 weeks    2. Chronic obstructive pulmonary disease, unspecified COPD type ([place])  J44.9 AMB REFERRAL FOR DME   Doing well with Breztri Samples given today until mail order arrives Renew albuterol for as needed use Oxygen saturations today nadir 89%    3. Chronic GERD  K21.9    Continue use of PPI Antireflux measures    4. Tobacco dependence due to cigarettes  F17.210    Patient was counseled regards to discontinuation of smoking Been curtailing use of cigarettes     Orders Placed This Encounter  Procedures   CT CHEST WO CONTRAST    Standing Status:   Future    Standing Expiration Date:   09/12/2021    Scheduling Instructions:     8-12 WEEKS    Order Specific Question:   Preferred imaging location?    Answer:   [facility]    Referral Priority:   Routine    Referral Type:   Durable Medical Equipment Purchase    Number of Visits Requested:   1   Meds ordered this encounter  Medications   Budeson-Glycopyrrol-Formoterol (BREZTRI AEROSPHERE) 160-9-4.8 MCG/ACT AERO    Sig: Inhale 2 puffs into the lungs in the morning and at bedtime.    Dispense:  5.9 g    Refill:  0    Order Specific Question:   Lot Number?    Answer:   [id number]    Order Specific Question:   Expiration Date?    Answer:   02/16/2023    Order Specific Question:   Manufacturer?    Answer:   [employer] [[id number]]    Order Specific Question:   Quantity    Answer:   2   albuterol (VENTOLIN HFA) 108 (90 Base) MCG/ACT inhaler    Sig: Inhale 2 puffs into the lungs every 6 (six) hours as needed for wheezing or shortness of breath.    Dispense:  8 g    Refill:  0  albuterol (VENTOLIN  HFA) 108 (90 Base) MCG/ACT inhaler    Sig: Inhale 2 puffs into the lungs every 6 (six) hours as needed for wheezing or shortness of breath.    Dispense:  24 g    Refill:  3    3 month supply   The patient had pneumonia noted on 17 August 2020.  The pattern of pneumonia is consistent with lobar pneumonia pattern is also highly suspicious for aspiration.  Does have a history of GERD with reflux.  She has noted that she does well with Breztri.  However, she has not received her Breztri through the mail order pharmacy.  We provided her with samples today to tide her over until her medications arrive.  She also had albuterol rescue filled.  She will need follow-up CT scan 8 to 12 weeks after her initial 2 June CT scan.  This is to ensure resolution of her findings.  We have discontinued oxygen as she is maintaining above 89% for the most part.  I suspect that this will continue to improve with resuming her Breztri and further resolution of her pneumonia.  We will see her in follow-up in 4 to 6 weeks time she is to contact us prior to that time should any new difficulties arise.   Renold Don, MD [facility]   *This note was dictated using voice recognition software/Dragon.  Despite best efforts to proofread, errors can occur which can change the meaning.  Any change was purely unintentional.

## 2020-09-13 ENCOUNTER — Encounter: Payer: Self-pay | Admitting: Physician Assistant

## 2020-09-13 ENCOUNTER — Ambulatory Visit (INDEPENDENT_AMBULATORY_CARE_PROVIDER_SITE_OTHER): Payer: Medicare Other | Admitting: Physician Assistant

## 2020-09-13 ENCOUNTER — Telehealth: Payer: Self-pay | Admitting: Pulmonary Disease

## 2020-09-13 VITALS — BP 118/66 | HR 98 | Ht 61.0 in | Wt 181.0 lb

## 2020-09-13 DIAGNOSIS — I6523 Occlusion and stenosis of bilateral carotid arteries: Secondary | ICD-10-CM | POA: Diagnosis not present

## 2020-09-13 DIAGNOSIS — Z720 Tobacco use: Secondary | ICD-10-CM | POA: Diagnosis not present

## 2020-09-13 DIAGNOSIS — I10 Essential (primary) hypertension: Secondary | ICD-10-CM

## 2020-09-13 DIAGNOSIS — Z789 Other specified health status: Secondary | ICD-10-CM

## 2020-09-13 DIAGNOSIS — Z8701 Personal history of pneumonia (recurrent): Secondary | ICD-10-CM

## 2020-09-13 DIAGNOSIS — E785 Hyperlipidemia, unspecified: Secondary | ICD-10-CM

## 2020-09-13 NOTE — Progress Notes (Signed)
Office Visit    Patient Name: Lindsay Morse Date of Encounter: 09/13/2020  PCP:  Gladstone Lighter, [place]  Cardiologist:  Ida Rogue, MD  Advanced Practice Provider:  No care team member to display Electrophysiologist:  None :[id number]}   Chief Complaint    Chief Complaint  Patient presents with   Hospitalization [facility] follow up for positive D-dimer and pneumonia. Medications verbally reviewed with patient.     65 y.o. female with history of hypertension, COPD, bilateral carotid artery disease, tobacco use, seizure, BPPV, vitamin D deficiency, and who presents today for follow-up after recent admission for pneumonia.  Past Medical History    Past Medical History:  Diagnosis Date   Anxiety    Carotid arterial disease ([place])    a. 06/2012 Carotid U/S: 40-50% bilat ICA stenosis, f/u 1 yr.   COPD (chronic obstructive pulmonary disease) (H[facility])    Depression    Depression    Fibromyalgia    GERD (gastroesophageal reflux disease) 05/28/2013   HTN (hypertension)    now off all meds   Hyperlipidemia    Irritable bowel syndrome    OA (osteoarthritis)    hips, back   Personal history of colonic polyps    Suicidal behavior    >20 years ago   Syncope and collapse    a. 01/2009 Echo: EF 55-60%, mildly dil LA.   Past Surgical History:  Procedure Laterality Date   BREAST BIOPSY Right 2020   benign   BREAST EXCISIONAL BIOPSY Right 2019   BREAST SURGERY  2019   benign nodule removed   CARPAL TUNNEL RELEASE  2007   right   CERVICAL SPINE SURGERY  2016   CHOLECYSTECTOMY     Dr. Emilio Math   COLONOSCOPY  2008   repeat every 5 years,polyps- [place], [place] (03/29/2005-Hyperplastic polyp)   TUBAL LIGATION  1978   UPPER GASTROINTESTINAL ENDOSCOPY  6/11   duodenitis,maloney dilated    Allergies  Allergies  Allergen Reactions   Regadenoson Other (See Comments)    Trouble breathing   Statins     Other reaction(s): Muscle  Pain Joint pains   Milnacipran     REACTION: tachycardia    History of Present Illness    Lindsay Morse is a 65 y.o. female with PMH as above.  She has history of hypertension, COPD, bilateral carotid artery disease, tobacco use, seizure, BPPV, vitamin D deficiency.    Previous 2015 echo with normal LVEF, no significant valvular disease.  01/31/2020 carotid duplex with right ICA 1 to 39% stenosed, left ICA 40 to 59% stenosed.  She was seen by her PCP 07/29/2020 with an episode of right arm pain and chest tightness.  She noted that on Easter weekend she felt as if she might pass out when out with the dogs and bent over.  Cardiology follow-up was recommended.   Seen by pulmonology 07/24/2020 and transition to [facility] for COPD  She was last seen in clinic 08/01/2020.  She noted persistent lightheadedness and dizziness with standing and position changes.  Orthostatic vitals negative.  BP at home less than 120/60.  No near-syncope or syncope.  She clarified that her previous episode of chest discomfort was in the setting of voiding for mammogram and self resolved after mammogram.  She felt it was related to anxiety.  She denied a formal exercise routine but was planning to resume walking.  She was motivated to improve her health and had been  working on weight loss.  She was motivated to quit smoking.  Repeat carotid study was recommended 01/2021 for monitoring.  Smoking cessation encouraged.  Losartan reduced to 50 mg daily.  She was started on Wellbutrin for smoking cessation.  On review of EMR, she was admitted 08/17/2020-08/21/2020 for acute pneumonia. Pt understandably expresses concern regarding recent admission and discharge paperwork current diagnoses. She wonders about a dx of suspected COVID19 pna if ED PCR was negative. Encouraged pt to continue OP ID work-up with report of sputum cultures pending. She wonders about the HF dx on her paperwork. Confirmed [facility] did not round on her during this  admission. Suspect this dx was associated with IV lasix during admission and po lasix recommended at discharge with pt report she did not need the po diuresis, and this discussed as very reassuring. She wonders about the elevated D-dimer, given negative CTA/DVT studies. Clarification provided that D-dimer elevation is often nonspecific with pt understanding.  Today, 09/13/2020, we reviewed her recent admission as above and discharge paperwork.  On ROS, she reports chest pain at night when lying in bed.  She does not have chest pain at other times, including with exertion.  No chest pain today at rest.  She notes improvement in her breathing and is not currently using oxygen.  Her dizziness had initially improved following admission but recently has returned without syncope or recent falls.  BP at home 112/60 with heart rate 90-100s.  Weight 178 pounds at home.  At times, she reports feeling her heart rate.  She continues to cough, producing thick white sputum.  She reports she is very fatigued.  No signs or symptoms of bleeding.  She does report that she is still smoking and at 3 cigarettes/day, though the other day she reports only having 2 cigarettes.  She continues to recover from her pneumonia and is hopeful that she will find out more information regarding the reason she so suddenly felt ill following infectious disease work-up/cultures as above.  She reports feeling as if she is unable to do as much activity as before the admission with reassurance provided that at least some element of deconditioning is at play as well as recovery from her illness, and her strength will continue to improve as she is further out from her discharge.  Home Medications   Current Outpatient Medications  Medication Instructions   albuterol (VENTOLIN HFA) 108 (90 Base) MCG/ACT inhaler 2 puffs, Inhalation, Every 6 hours PRN   aspirin 81 mg, Oral, Daily   Budeson-Glycopyrrol-Formoterol (BREZTRI AEROSPHERE) 160-9-4.8 MCG/ACT  AERO 2 puffs, Inhalation, 2 times daily   buPROPion (WELLBUTRIN SR) 150 MG 12 hr tablet TAKE ONE TABLET (150MG ) BY MOUTH EVERY DAY FOR THREE DAYS, THEN INCREASE TO ONE TABLET BY MOUTH TWICE DAILY IF TOLERATED.   calcium-vitamin D (OSCAL WITH D) 500-200 MG-UNIT tablet 1 tablet, Oral   carvedilol (COREG) 12.5 mg, Oral, 2 times daily with meals   co-enzyme Q-10 100 mg, Oral, Daily   [employer] Triple Magnesium Complex 400 mg, Oral, 1 Day/Dose   diclofenac Sodium (VOLTAREN) 2 g, Topical, Daily   ezetimibe (ZETIA) 10 MG tablet 1 tablet, Oral, Daily   fluticasone (FLONASE) 50 MCG/ACT nasal spray Nasal   guaiFENesin-dextromethorphan (ROBITUSSIN DM) 100-10 MG/5ML syrup 5 mLs, Oral, Every 4 hours PRN   ipratropium-albuterol (DUONEB) 0.5-2.5 (3) MG/3ML SOLN Inhalation   levETIRAcetam (KEPPRA) 1000 MG tablet 1 tablet, Oral, 2 times daily   lidocaine (LIDODERM) 5 % 2 patches, Transdermal, As needed  losartan (COZAAR) 50 mg, Oral, Daily   montelukast (SINGULAIR) 10 mg, Oral, Daily at bedtime   pantoprazole (PROTONIX) 40 mg, Oral, Daily   predniSONE (DELTASONE) 10 MG tablet 4 tabs po daily x 3 days, 3 tabs po daily x 3 days, 2 tabs po daily x 3 days, 1 tab po daily x 3 days   rOPINIRole (REQUIP) 0.5 mg, Oral, 2 times daily   tiZANidine (ZANAFLEX) 4 mg, Oral, Daily at bedtime, As needed for muscle spasm.   vitamin B-12 (CYANOCOBALAMIN) 1,000 mcg, Oral, Daily   vitamin C 100 mg, Oral, Daily   zinc sulfate 220 mg, Oral, Daily     Review of Systems    She reports chest pain at night in bed but not with exertion, racing HR, dyspnea though improving, pnd, orthopnea, and return of dizziness. She has cough with thick and white sputum.  She has fatigue and decreased activity tolerance.  No n/v, syncope, edema, or early satiety reported.  She continues to recover from her pneumonia.   All other systems reviewed and are otherwise negative except as noted above.  Physical Exam    VS:  BP 118/66 (BP Location: Left Arm,  Patient Position: Sitting, Cuff Size: Normal)   Pulse 98   Ht 5\' 1"  (1.549 m)   Wt 181 lb (82.1 kg)   SpO2 93%   BMI 34.20 kg/m  , BMI Body mass index is 34.2 kg/m. GEN: Well nourished, well developed, in no acute distress. HEENT: normal. Neck: Supple, no JVD, carotid bruits, or masses. Cardiac:RRR, no murmurs, rubs, or gallops. No clubbing, cyanosis, edema.  Radials/DP/PT 2+ and equal bilaterally.  Respiratory:  Respirations regular and unlabored, bilateral rhonchi and wheeze. GI: Soft, nontender, nondistended, BS + x 4. MS: no deformity or atrophy. Skin: warm and dry, no rash. Neuro:  Strength and sensation are intact. Psych: Normal affect.  Accessory Clinical Findings    ECG personally reviewed by me today - NSR, 98bpm, possible LAE- no acute changes.  VITALS Reviewed today   Temp Readings from Last 3 Encounters:  09/12/20 (!) 97.4 F (36.3 C) (Temporal)  08/21/20 98 F (36.7 C) (Oral)  07/24/20 (!) 97.3 F (36.3 C) (Temporal)   BP Readings from Last 3 Encounters:  09/13/20 118/66  09/12/20 114/72  08/21/20 (!) 132/95   Pulse Readings from Last 3 Encounters:  09/13/20 98  09/12/20 86  08/21/20 84    Wt Readings from Last 3 Encounters:  09/13/20 181 lb (82.1 kg)  09/12/20 179 lb 9.6 oz (81.5 kg)  08/17/20 182 lb (82.6 kg)     LABS  reviewed today   Lab Results  Component Value Date   WBC 11.6 (H) 08/21/2020   HGB 12.0 08/21/2020   HCT 34.8 (L) 08/21/2020   MCV 93.3 08/21/2020   PLT 309 08/21/2020   Lab Results  Component Value Date   CREATININE 0.84 08/21/2020   BUN 33 (H) 08/21/2020   NA 139 08/21/2020   K 3.8 08/21/2020   CL 100 08/21/2020   CO2 30 08/21/2020   Lab Results  Component Value Date   ALT 50 (H) 08/21/2020   AST 31 08/21/2020   ALKPHOS 49 08/21/2020   BILITOT 0.5 08/21/2020   Lab Results  Component Value Date   CHOL 269 (H) 10/05/2012   HDL 41.80 10/05/2012   LDLDIRECT 175.5 10/05/2012   TRIG 273.0 (H) 10/05/2012    CHOLHDL 6 10/05/2012    No results found for: HGBA1C Lab Results  Component  Value Date   TSH 0.86 10/05/2012     STUDIES/PROCEDURES reviewed today   LE [place] 08/19/20 IMPRESSION: No evidence of deep venous thrombosis in either lower extremity.  CTA 08/17/2020 IMPRESSION: 1. No evidence of pulmonary embolism. 2. Signs of multifocal infection, developed in the interval since imaging from April. Would consider atypical infectious etiologies as well as aspiration related changes given the appearance of LEFT lower lobe findings. 3. Given more focal consolidative changes and adenopathy in the RIGHT chest would suggest attention on follow-up to ensure resolution. 4. Emphysema and aortic atherosclerosis. Aortic Atherosclerosis (ICD10-I70.0) and Emphysema (ICD10-J43.9).  US Carotids 01/2020 Summary:  Right Carotid: Velocities in the right ICA are consistent with a 1-39%  stenosis.  Left Carotid: Velocities in the left ICA are consistent with a 40-59%  stenosis.  Vertebrals:  Bilateral vertebral arteries demonstrate antegrade flow.  Subclavians: Normal flow hemodynamics were seen in bilateral subclavian               arteries.  Suggested repeat study 45mo ------------------------------------------------- For the below MPI/Echo from 2015: Plse see scans under CV studies for complete report Below is a transcribed summary, performed by myself  MPI 10/18/2013 Normal stress test  Echo  10/04/2013 1.  Left ventricular ejection fraction by visual estimate 60 to 65% 2.  Normal global LV systolic function 3.  Impaired relaxation pattern of LV diastolic filling 4.  Normal RV size and function 5.  Normal RVSP Physician interpretation: The LV internal cavity size is normal.  LV posterior wall thickness normal.  No evidence of LVH.  Global LV systolic function normal.  By visual estimate, LVEF 60 to 65%.  Trace MR.  Trivial TR.  Trace PR.  Estimated RAP 5 mmHg.  RVSP normal at 13.0 mmHg.  No  evidence of dilation.   Assessment & Plan    Recent admission for pneumonia --Continues to improve after admission for pneumonia.  She continues to feel fatigued with reassurance provided that this often improves the further out from discharge and is in part due to spending so much time in bed during any hospitalization.  Questions regarding her discharge paperwork answered with patient appreciation for the clarification.  Encouraged her to continue to follow-up with infectious disease with sputum cultures pending.  Smoking cessation encouraged.  Will defer further work-up and management of recent pneumonia to infectious disease/PCP.  In regards to Lasix prescribed at discharge, she is euvolemic and well compensated on exam with no indication for a standing diuretic at this time.  Carotid artery disease -- H/o carotid artery disease with 01/2020 imaging showing R ICA 1 to 25% stenosed and LICA 40 to 85% stenosed.  She does report a return of her dizziness, though with consideration of her BPPV and recent admission for pneumonia.  Recommend repeat study 01/2021, sooner if bruit on exam in the future.  Smoking cessation encouraged.  Recommend HR, BP, LDL, and glycemic control.  Continue ASA and Zetia with statin intolerance as below.  Essential hypertension, goal BP less than 130/80, well controlled - BP today well controlled with previous visits reporting hypertension.  Continue current losartan, carvedilol.  Continue to monitor BP at home.  No medication changes today.  Tobacco use --Continues to be motivated to quit.  She is currently smoking 3 cigarettes with report that the other day she had cut down to 2 cigarettes.  Continue Wellbutrin and ongoing efforts to completely quit smoking.  Agree with previous recommendation of utilization of 1800QUITNOW for additional support,  if needed.  HLD, goal LDL below 70 Hypertriglyceridemia Statin intolerance - LDL goal below 70, given carotid dz and  aortic atherosclerosis seen on CT during recent admission. Pt has known intolerance to statins with myalgias reported with Crestor and Lipitor.  Currently on Zetia for LDL control and tolerating - continue.  Most recent cholesterol 288, Tg 414. At RTC or next PCP visit, recommend discuss PCSK9i initiation +/- Vascepa +/- bempedoic acid +/- fenofibrate tx. For now, will continue Zetia.  Elevated A1C -- 03/2020 A1c elevated at 5.9.  Further recommendations per PCP. Glycemic control recommended from a cardiac RF control standpoint.  Disposition: RTC 4 months  *Please be aware that the above documentation was completed voice recognition software and may contain dictation errors.     Arvil Chaco, PA-C 09/13/2020

## 2020-09-13 NOTE — Telephone Encounter (Signed)
Order was placed to [facility] on 09/12/2020. Per patient, [facility] did not receive order.   Rodena Piety, can you help with this.

## 2020-09-13 NOTE — Patient Instructions (Signed)
Medication Instructions:  Your physician recommends that you continue on your current medications as directed. Please refer to the Current Medication list given to you today.  *If you need a refill on your cardiac medications before your next appointment, please call your pharmacy*   Lab Work: None ordered  If you have labs (blood work) drawn today and your tests are completely normal, you will receive your results only by: [facility] (if you have MyChart) OR A paper copy in the mail If you have any lab test that is abnormal or we need to change your treatment, we will call you to review the results.   Testing/Procedures: None ordered   Follow-Up: At [facility], you and your health needs are our priority.  As part of our continuing mission to provide you with exceptional heart care, we have created designated Provider Care Teams.  These Care Teams include your primary Cardiologist (physician) and Advanced Practice Providers (APPs -  Physician Assistants and Nurse Practitioners) who all work together to provide you with the care you need, when you need it.  We recommend signing up for the patient portal called "MyChart".  Sign up information is provided on this After Visit Summary.  MyChart is used to connect with patients for Virtual Visits (Telemedicine).  Patients are able to view lab/test results, encounter notes, upcoming appointments, etc.  Non-urgent messages can be sent to your provider as well.   To learn more about what you can do with MyChart, go to [email or web address].    Your next appointment:   4 month(s)  The format for your next appointment:   In Person  Provider:   You may see Ida Rogue, MD or one of the following Advanced Practice Providers on your designated Care Team:   Murray Hodgkins, NP Christell Faith, PA-C Marrianne Mood, PA-C Cadence [place], [place] Laurann Montana, NP   Other Instructions N/A

## 2020-09-13 NOTE — Telephone Encounter (Signed)
I sent message to [employer] letting them know that Andee Poles confirmed the order on 6/28 and patient is stating [employer] doesn't have the order

## 2020-09-19 NOTE — Telephone Encounter (Signed)
Lindsay Morse, can this message be closed?

## 2020-09-19 NOTE — Telephone Encounter (Signed)
I just spoke with Lindsay Morse and her 27 was picked up on Thursday 09/14/20

## 2020-10-03 ENCOUNTER — Ambulatory Visit: Payer: Medicare Other | Admitting: Cardiovascular Disease

## 2020-10-11 ENCOUNTER — Ambulatory Visit: Payer: Medicare Other | Admitting: Primary Care

## 2020-10-30 ENCOUNTER — Encounter: Payer: Self-pay | Admitting: Gastroenterology

## 2020-11-09 ENCOUNTER — Ambulatory Visit
Admission: RE | Admit: 2020-11-09 | Discharge: 2020-11-09 | Disposition: A | Discharge status: Home / Self Care | Payer: Medicare Other | Source: Ambulatory Visit | Attending: Pulmonary Disease | Admitting: Pulmonary Disease

## 2020-11-09 ENCOUNTER — Other Ambulatory Visit: Payer: Self-pay

## 2020-11-09 DIAGNOSIS — J189 Pneumonia, unspecified organism: Secondary | ICD-10-CM

## 2020-11-10 ENCOUNTER — Telehealth: Payer: Self-pay | Admitting: Pulmonary Disease

## 2020-11-10 NOTE — Telephone Encounter (Signed)
Spoke to patient, who is requesting CT results from 11/09/2020.  Dr. Patsey Berthold, please advise. Thanks

## 2020-11-14 NOTE — Telephone Encounter (Signed)
Prior pneumonia noted has totally resolved.  There is only some minimal scarring left on the left.  CT looks good.

## 2020-11-14 NOTE — Telephone Encounter (Signed)
Called and spoke to patient about Ct results, patient had a clear understanding. Nothing further needed.

## 2021-05-16 ENCOUNTER — Ambulatory Visit: Primary: Family Medicine

## 2021-05-16 ENCOUNTER — Inpatient Hospital Stay: Admit: 2021-05-18 | Payer: PRIVATE HEALTH INSURANCE | Primary: Family Medicine

## 2021-05-16 DIAGNOSIS — A319 Mycobacterial infection, unspecified: Secondary | ICD-10-CM

## 2021-05-17 ENCOUNTER — Ambulatory Visit: Primary: Family Medicine

## 2021-05-17 ENCOUNTER — Inpatient Hospital Stay: Admit: 2021-05-18 | Payer: PRIVATE HEALTH INSURANCE | Primary: Family Medicine

## 2021-05-17 DIAGNOSIS — A319 Mycobacterial infection, unspecified: Secondary | ICD-10-CM

## 2021-05-18 ENCOUNTER — Inpatient Hospital Stay: Admit: 2021-05-18 | Discharge: 2021-05-18 | Payer: PRIVATE HEALTH INSURANCE | Primary: Family Medicine

## 2021-05-18 DIAGNOSIS — A319 Mycobacterial infection, unspecified: Secondary | ICD-10-CM

## 2021-05-21 LAB — CULTURE, RESPIRATORY
Gram Stain Result: <10 — AB
Gram Stain Result: <10 — AB

## 2021-06-13 ENCOUNTER — Encounter

## 2021-06-13 ENCOUNTER — Inpatient Hospital Stay: Admit: 2021-06-13 | Payer: MEDICARE | Primary: Family Medicine

## 2021-06-13 DIAGNOSIS — J219 Acute bronchiolitis, unspecified: Secondary | ICD-10-CM

## 2021-06-13 DIAGNOSIS — J440 Chronic obstructive pulmonary disease with acute lower respiratory infection: Secondary | ICD-10-CM

## 2021-07-29 LAB — CULTURE WITH SMEAR, ACID FAST BACILLIUS

## 2021-08-23 NOTE — Telephone Encounter (Signed)
Formatting of this note might be different from the original.  Left message for the patient to call back and schedule follow up appointment with [facility].   Electronically signed by Simone Curia at 08/23/2021  2:32 PM EDT

## 2021-11-07 ENCOUNTER — Encounter

## 2021-11-23 ENCOUNTER — Inpatient Hospital Stay: Admit: 2021-11-23 | Payer: MEDICARE | Attending: Pulmonary Disease | Primary: Family Medicine

## 2021-11-23 DIAGNOSIS — Z122 Encounter for screening for malignant neoplasm of respiratory organs: Secondary | ICD-10-CM

## 2021-11-23 DIAGNOSIS — Z87891 Personal history of nicotine dependence: Secondary | ICD-10-CM

## 2021-12-12 ENCOUNTER — Encounter

## 2021-12-17 ENCOUNTER — Inpatient Hospital Stay: Payer: MEDICARE | Primary: Family Medicine

## 2022-01-08 NOTE — Progress Notes (Signed)
Formatting of this note is different from the original.  ASSESSMENT:     (I25.10) Coronary artery disease involving native coronary artery of native heart without angina pectoris - Plan: EKG 12-LEAD    (I10) Essential hypertension    (E78.5) Dyslipidemia - Plan: Lipid Complete Panel, HEPATIC FUNCTION PANEL    PLAN:   We discussed dosing for her livalo. She is to rtc in 3 mo with lipids prior  Given what she telling me in terms of her recent lipids, I suspect that she will wind up on repatha, ezetimibe and vascepa    INTERVAL EVENTS:       SUBJECTIVE:     Chief Complaint   Patient presents with    CORONARY ARTERY DISEASE    HYPERTENSION     Ms Rexroad returns to the office for follow up and management of her nonobstructive CAD, HTN, HLD statin intol, COPD, tobacco use, and PreDM.  She has had 3 episodes of chest discomfort since she was last seen.  She has stopped smoking again and she has not had any further . Dr Eulas Post has just started her on livalo .  She has had no pnd,doe,or orthopnea.  She has had no LE edema and she has had no syncope.  Her lipids have been uncontrolled and she is just starting the livalo.    Review of Systems   Constitutional:  Negative for diaphoresis, malaise/fatigue and weight loss.   Respiratory:  Negative for cough and shortness of breath.    Cardiovascular:  Negative for chest pain, palpitations, orthopnea, claudication, leg swelling and PND.   Gastrointestinal:  Negative for nausea and vomiting.   Musculoskeletal:  Negative for joint pain and myalgias.   Neurological:  Negative for dizziness and weakness.   Endo/Heme/Allergies:  Does not bruise/bleed easily.   Psychiatric/Behavioral:  Negative for depression. The patient is not nervous/anxious.        OBJECTIVE     BP 110/68 (Site: Arm L, Position: Sitting, Cuff Size: Large)   Pulse 80   Ht '5\' 4"'$  (1.626 m)   Wt 88 kg (194 lb)   SpO2 94%   BMI 33.30 kg/m  BMI: 33.3    Physical Exam  Vitals reviewed.   HENT:      Head: Normocephalic  and atraumatic.   Eyes:      Pupils: Pupils are equal, round, and reactive to light.   Neck:      Thyroid: No thyromegaly.      Vascular: No JVD.   Cardiovascular:      Rate and Rhythm: Normal rate and regular rhythm.      Chest Wall: PMI is not displaced.      Pulses: Normal pulses.      Heart sounds: Normal heart sounds, S1 normal and S2 normal. No murmur heard.     No S3 or S4 sounds.   Pulmonary:      Effort: No respiratory distress.      Breath sounds: No wheezing or rales.   Abdominal:      General: Bowel sounds are normal. There is no distension.      Palpations: Abdomen is soft.      Tenderness: There is no abdominal tenderness.   Musculoskeletal:         General: Normal range of motion.   Lymphadenopathy:      Cervical: No cervical adenopathy.   Skin:     General: Skin is warm and dry.   Neurological:  Mental Status: She is alert and oriented to person, place, and time.      Gait: Gait is intact.   Psychiatric:         Mood and Affect: Affect normal.     I have reviewed information entered by the clinical staff and/or patient and verified it as accurate or edited where necessary.    Electronically signed by Shireen Quan, MD at 01/08/2022  2:10 PM EDT

## 2022-01-30 ENCOUNTER — Ambulatory Visit: Payer: MEDICARE | Primary: Family Medicine

## 2022-01-30 ENCOUNTER — Ambulatory Visit: Payer: PRIVATE HEALTH INSURANCE | Primary: Family Medicine

## 2022-02-28 ENCOUNTER — Ambulatory Visit: Payer: MEDICARE | Primary: Family Medicine

## 2022-03-07 ENCOUNTER — Encounter

## 2022-03-12 ENCOUNTER — Ambulatory Visit: Payer: MEDICARE | Primary: Family Medicine

## 2022-03-12 DIAGNOSIS — R221 Localized swelling, mass and lump, neck: Secondary | ICD-10-CM

## 2022-03-12 DIAGNOSIS — E041 Nontoxic single thyroid nodule: Secondary | ICD-10-CM

## 2022-03-21 ENCOUNTER — Ambulatory Visit: Payer: MEDICARE | Primary: Family Medicine

## 2022-03-21 DIAGNOSIS — Z1231 Encounter for screening mammogram for malignant neoplasm of breast: Secondary | ICD-10-CM

## 2022-03-21 DIAGNOSIS — E2839 Other primary ovarian failure: Secondary | ICD-10-CM

## 2022-03-28 NOTE — Telephone Encounter (Signed)
Formatting of this note might be different from the original.  I called pt and inform Per Dr. Sharyon Medicus  recommend she try to use her CPAP nightly and that will keep her asleep longer throughout the night. Pt said that she is not interested   Electronically signed by Clarita Crane A at 03/29/2022 10:18 AM EST

## 2022-04-25 LAB — CULTURE, RESPIRATORY: Gram Stain Result: <10 — AB

## 2022-04-25 LAB — CULTURE WITH SMEAR, ACID FAST BACILLIUS

## 2022-05-01 ENCOUNTER — Encounter

## 2022-05-01 ENCOUNTER — Inpatient Hospital Stay: Admit: 2022-05-01 | Payer: MEDICARE | Primary: Family Medicine

## 2022-05-01 DIAGNOSIS — M5410 Radiculopathy, site unspecified: Secondary | ICD-10-CM

## 2022-05-01 DIAGNOSIS — M48061 Spinal stenosis, lumbar region without neurogenic claudication: Secondary | ICD-10-CM

## 2022-05-02 ENCOUNTER — Ambulatory Visit: Payer: MEDICARE | Primary: Family Medicine

## 2022-05-15 ENCOUNTER — Inpatient Hospital Stay: Payer: MEDICARE | Primary: Family Medicine

## 2022-07-08 ENCOUNTER — Inpatient Hospital Stay: Admit: 2022-07-08 | Payer: MEDICARE | Primary: Family Medicine

## 2022-07-08 DIAGNOSIS — I10 Essential (primary) hypertension: Secondary | ICD-10-CM

## 2022-07-08 DIAGNOSIS — I110 Hypertensive heart disease with heart failure: Secondary | ICD-10-CM

## 2022-07-09 LAB — ECHOCARDIOGRAM COMPLETE 2D W DOPPLER W COLOR: Left Ventricular Ejection Fraction: 65

## 2022-10-14 NOTE — Progress Notes (Signed)
 In review of chart/program spreadsheet, Annual LDCT LCS program order has not been received as of today.     Requested annual renewal by rightfax to last ordering provider.     Patrina L. Stitts, LPN  [facility] - Lung Cancer Screening Program Coordinator  Phone [phone]  Fax [phone]

## 2023-01-14 ENCOUNTER — Encounter

## 2023-01-15 ENCOUNTER — Inpatient Hospital Stay: Admit: 2023-01-15 | Payer: MEDICARE | Attending: Pulmonary Disease | Primary: Family Medicine

## 2023-01-15 ENCOUNTER — Inpatient Hospital Stay: Admit: 2023-01-15 | Payer: MEDICARE | Primary: Family Medicine

## 2023-01-15 DIAGNOSIS — C3411 Malignant neoplasm of upper lobe, right bronchus or lung: Secondary | ICD-10-CM

## 2023-01-15 DIAGNOSIS — J449 Chronic obstructive pulmonary disease, unspecified: Secondary | ICD-10-CM

## 2023-01-15 DIAGNOSIS — R222 Localized swelling, mass and lump, trunk: Secondary | ICD-10-CM

## 2023-01-15 LAB — POCT CREATININE - BLOOD: Creatinine: 1.1 mg/dL (ref 0.6–1.3)

## 2023-01-15 MED ORDER — IOPAMIDOL 61 % IV SOLN
61 | Freq: Once | INTRAVENOUS | Status: AC | PRN
Start: 2023-01-15 — End: 2023-01-15
  Administered 2023-01-15: 23:00:00 80 mL via INTRAVENOUS

## 2023-01-15 MED FILL — ISOVUE-300 61 % IV SOLN: 61 % | INTRAVENOUS | Qty: 80

## 2023-02-07 ENCOUNTER — Encounter: Admit: 2023-02-07 | Admitting: Medical Oncology

## 2023-02-07 DIAGNOSIS — C3481 Malignant neoplasm of overlapping sites of right bronchus and lung: Secondary | ICD-10-CM

## 2023-02-11 ENCOUNTER — Ambulatory Visit: Payer: MEDICARE | Primary: Family Medicine

## 2023-02-28 ENCOUNTER — Encounter

## 2023-03-03 ENCOUNTER — Ambulatory Visit: Payer: MEDICARE | Primary: Family Medicine

## 2023-03-11 ENCOUNTER — Encounter: Admit: 2023-03-11 | Admitting: Family Medicine

## 2023-03-11 DIAGNOSIS — Z1231 Encounter for screening mammogram for malignant neoplasm of breast: Secondary | ICD-10-CM

## 2023-04-19 DEATH — deceased
